# Patient Record
Sex: Female | Born: 1940 | Race: White | Hispanic: No | Smoking: Never smoker
Health system: Southern US, Community
[De-identification: ages and names within clinical notes are randomized; demographics above are authoritative.]

## PROBLEM LIST (undated history)

## (undated) DIAGNOSIS — K5790 Diverticulosis of intestine, part unspecified, without perforation or abscess without bleeding: Secondary | ICD-10-CM

## (undated) DIAGNOSIS — I10 Essential (primary) hypertension: Secondary | ICD-10-CM

## (undated) DIAGNOSIS — M1990 Unspecified osteoarthritis, unspecified site: Secondary | ICD-10-CM

## (undated) DIAGNOSIS — F419 Anxiety disorder, unspecified: Secondary | ICD-10-CM

## (undated) DIAGNOSIS — E785 Hyperlipidemia, unspecified: Secondary | ICD-10-CM

## (undated) DIAGNOSIS — R011 Cardiac murmur, unspecified: Secondary | ICD-10-CM

## (undated) DIAGNOSIS — I341 Nonrheumatic mitral (valve) prolapse: Secondary | ICD-10-CM

## (undated) DIAGNOSIS — C801 Malignant (primary) neoplasm, unspecified: Secondary | ICD-10-CM

## (undated) DIAGNOSIS — K219 Gastro-esophageal reflux disease without esophagitis: Secondary | ICD-10-CM

## (undated) DIAGNOSIS — R112 Nausea with vomiting, unspecified: Secondary | ICD-10-CM

## (undated) DIAGNOSIS — T8859XA Other complications of anesthesia, initial encounter: Secondary | ICD-10-CM

## (undated) DIAGNOSIS — N189 Chronic kidney disease, unspecified: Secondary | ICD-10-CM

## (undated) DIAGNOSIS — T4145XA Adverse effect of unspecified anesthetic, initial encounter: Secondary | ICD-10-CM

## (undated) DIAGNOSIS — Z9889 Other specified postprocedural states: Secondary | ICD-10-CM

## (undated) HISTORY — PX: ABDOMINAL HYSTERECTOMY: SHX81

## (undated) HISTORY — PX: LUMBAR SPINE SURGERY: SHX701

## (undated) HISTORY — DX: Essential (primary) hypertension: I10

## (undated) HISTORY — PX: JOINT REPLACEMENT: SHX530

## (undated) HISTORY — PX: BACK SURGERY: SHX140

## (undated) HISTORY — DX: Hyperlipidemia, unspecified: E78.5

## (undated) HISTORY — DX: Nonrheumatic mitral (valve) prolapse: I34.1

---

## 1997-09-28 ENCOUNTER — Other Ambulatory Visit: Admission: RE | Admit: 1997-09-28 | Discharge: 1997-09-28 | Payer: Self-pay | Admitting: Obstetrics & Gynecology

## 1998-10-05 ENCOUNTER — Other Ambulatory Visit: Admission: RE | Admit: 1998-10-05 | Discharge: 1998-10-05 | Payer: Self-pay | Admitting: Obstetrics & Gynecology

## 1998-11-17 ENCOUNTER — Other Ambulatory Visit: Admission: RE | Admit: 1998-11-17 | Discharge: 1998-11-17 | Payer: Self-pay | Admitting: Obstetrics & Gynecology

## 1998-12-28 ENCOUNTER — Encounter (INDEPENDENT_AMBULATORY_CARE_PROVIDER_SITE_OTHER): Payer: Self-pay | Admitting: Specialist

## 1998-12-28 ENCOUNTER — Other Ambulatory Visit: Admission: RE | Admit: 1998-12-28 | Discharge: 1998-12-28 | Payer: Self-pay | Admitting: Obstetrics & Gynecology

## 1999-04-20 ENCOUNTER — Other Ambulatory Visit: Admission: RE | Admit: 1999-04-20 | Discharge: 1999-04-20 | Payer: Self-pay | Admitting: Obstetrics & Gynecology

## 1999-06-15 ENCOUNTER — Inpatient Hospital Stay (HOSPITAL_COMMUNITY): Admission: RE | Admit: 1999-06-15 | Discharge: 1999-06-20 | Payer: Self-pay | Admitting: Orthopedic Surgery

## 1999-06-15 ENCOUNTER — Encounter: Payer: Self-pay | Admitting: Orthopedic Surgery

## 1999-07-15 ENCOUNTER — Encounter: Admission: RE | Admit: 1999-07-15 | Discharge: 1999-08-02 | Payer: Self-pay | Admitting: Orthopedic Surgery

## 1999-10-10 ENCOUNTER — Other Ambulatory Visit: Admission: RE | Admit: 1999-10-10 | Discharge: 1999-10-10 | Payer: Self-pay | Admitting: Obstetrics & Gynecology

## 2000-01-10 ENCOUNTER — Encounter: Payer: Self-pay | Admitting: Orthopedic Surgery

## 2000-01-10 ENCOUNTER — Ambulatory Visit (HOSPITAL_COMMUNITY): Admission: RE | Admit: 2000-01-10 | Discharge: 2000-01-10 | Payer: Self-pay | Admitting: Orthopedic Surgery

## 2000-07-12 ENCOUNTER — Inpatient Hospital Stay (HOSPITAL_COMMUNITY): Admission: RE | Admit: 2000-07-12 | Discharge: 2000-07-14 | Payer: Self-pay | Admitting: Orthopedic Surgery

## 2000-07-12 ENCOUNTER — Encounter: Payer: Self-pay | Admitting: Orthopedic Surgery

## 2000-07-12 HISTORY — PX: FOOT SURGERY: SHX648

## 2000-10-16 ENCOUNTER — Other Ambulatory Visit: Admission: RE | Admit: 2000-10-16 | Discharge: 2000-10-16 | Payer: Self-pay | Admitting: Obstetrics & Gynecology

## 2001-03-26 ENCOUNTER — Other Ambulatory Visit: Admission: RE | Admit: 2001-03-26 | Discharge: 2001-03-26 | Payer: Self-pay | Admitting: Obstetrics & Gynecology

## 2001-11-04 ENCOUNTER — Other Ambulatory Visit: Admission: RE | Admit: 2001-11-04 | Discharge: 2001-11-04 | Payer: Self-pay | Admitting: Obstetrics & Gynecology

## 2001-12-18 ENCOUNTER — Encounter (INDEPENDENT_AMBULATORY_CARE_PROVIDER_SITE_OTHER): Payer: Self-pay

## 2001-12-18 ENCOUNTER — Observation Stay (HOSPITAL_COMMUNITY): Admission: RE | Admit: 2001-12-18 | Discharge: 2001-12-19 | Payer: Self-pay | Admitting: Obstetrics & Gynecology

## 2001-12-18 HISTORY — PX: TOTAL VAGINAL HYSTERECTOMY: SHX2548

## 2003-08-21 ENCOUNTER — Other Ambulatory Visit: Admission: RE | Admit: 2003-08-21 | Discharge: 2003-08-21 | Payer: Self-pay | Admitting: Obstetrics & Gynecology

## 2004-12-23 ENCOUNTER — Ambulatory Visit: Payer: Self-pay | Admitting: Gastroenterology

## 2005-01-04 ENCOUNTER — Ambulatory Visit: Payer: Self-pay | Admitting: Internal Medicine

## 2006-06-05 ENCOUNTER — Ambulatory Visit (HOSPITAL_COMMUNITY): Admission: RE | Admit: 2006-06-05 | Discharge: 2006-06-05 | Payer: Self-pay | Admitting: Family Medicine

## 2006-11-26 ENCOUNTER — Encounter: Payer: Self-pay | Admitting: Internal Medicine

## 2007-07-17 ENCOUNTER — Other Ambulatory Visit: Admission: RE | Admit: 2007-07-17 | Discharge: 2007-07-17 | Payer: Self-pay | Admitting: Family Medicine

## 2008-08-18 ENCOUNTER — Ambulatory Visit (HOSPITAL_COMMUNITY): Admission: RE | Admit: 2008-08-18 | Discharge: 2008-08-18 | Payer: Self-pay | Admitting: Family Medicine

## 2008-08-31 ENCOUNTER — Ambulatory Visit (HOSPITAL_COMMUNITY): Admission: RE | Admit: 2008-08-31 | Discharge: 2008-08-31 | Payer: Self-pay | Admitting: Family Medicine

## 2009-06-14 HISTORY — PX: KNEE ARTHROSCOPY: SHX127

## 2009-09-01 ENCOUNTER — Ambulatory Visit (HOSPITAL_COMMUNITY): Admission: RE | Admit: 2009-09-01 | Discharge: 2009-09-01 | Payer: Self-pay | Admitting: Family Medicine

## 2010-02-11 ENCOUNTER — Ambulatory Visit: Payer: Self-pay | Admitting: Cardiovascular Disease

## 2010-02-17 ENCOUNTER — Ambulatory Visit: Payer: Self-pay | Admitting: Cardiovascular Disease

## 2010-02-21 ENCOUNTER — Ambulatory Visit
Admission: RE | Admit: 2010-02-21 | Discharge: 2010-02-21 | Payer: Self-pay | Source: Home / Self Care | Attending: Cardiovascular Disease | Admitting: Cardiovascular Disease

## 2010-02-21 HISTORY — PX: CARDIAC CATHETERIZATION: SHX172

## 2010-03-16 ENCOUNTER — Ambulatory Visit: Payer: Self-pay | Admitting: Cardiovascular Disease

## 2010-05-26 ENCOUNTER — Ambulatory Visit: Payer: Self-pay | Admitting: Cardiovascular Disease

## 2010-06-26 ENCOUNTER — Emergency Department (HOSPITAL_COMMUNITY)
Admission: EM | Admit: 2010-06-26 | Discharge: 2010-06-26 | Disposition: A | Discharge status: Home / Self Care | Payer: Medicare Other | Attending: Emergency Medicine | Admitting: Emergency Medicine

## 2010-06-26 ENCOUNTER — Emergency Department (HOSPITAL_COMMUNITY): Payer: Medicare Other

## 2010-06-26 DIAGNOSIS — D72829 Elevated white blood cell count, unspecified: Secondary | ICD-10-CM | POA: Insufficient documentation

## 2010-06-26 DIAGNOSIS — R109 Unspecified abdominal pain: Secondary | ICD-10-CM | POA: Insufficient documentation

## 2010-06-26 DIAGNOSIS — K5732 Diverticulitis of large intestine without perforation or abscess without bleeding: Secondary | ICD-10-CM | POA: Insufficient documentation

## 2010-06-26 DIAGNOSIS — I10 Essential (primary) hypertension: Secondary | ICD-10-CM | POA: Insufficient documentation

## 2010-06-26 LAB — DIFFERENTIAL
Basophils Absolute: 0 10*3/uL (ref 0.0–0.1)
Basophils Relative: 0 % (ref 0–1)
Neutro Abs: 7.9 10*3/uL — ABNORMAL HIGH (ref 1.7–7.7)
Neutrophils Relative %: 73 % (ref 43–77)

## 2010-06-26 LAB — HEPATIC FUNCTION PANEL
AST: 20 U/L (ref 0–37)
Albumin: 3.5 g/dL (ref 3.5–5.2)
Alkaline Phosphatase: 65 U/L (ref 39–117)
Total Bilirubin: 0.7 mg/dL (ref 0.3–1.2)
Total Protein: 6.9 g/dL (ref 6.0–8.3)

## 2010-06-26 LAB — URINE MICROSCOPIC-ADD ON

## 2010-06-26 LAB — CBC
Hemoglobin: 12.8 g/dL (ref 12.0–15.0)
Platelets: 299 10*3/uL (ref 150–400)
RBC: 4.05 MIL/uL (ref 3.87–5.11)

## 2010-06-26 LAB — URINALYSIS, ROUTINE W REFLEX MICROSCOPIC
Glucose, UA: NEGATIVE mg/dL
Ketones, ur: NEGATIVE mg/dL
Nitrite: NEGATIVE
Specific Gravity, Urine: 1.013 (ref 1.005–1.030)
pH: 7 (ref 5.0–8.0)

## 2010-06-26 LAB — BASIC METABOLIC PANEL
CO2: 27 mEq/L (ref 19–32)
Calcium: 10 mg/dL (ref 8.4–10.5)
Chloride: 108 mEq/L (ref 96–112)
GFR calc Af Amer: 55 mL/min — ABNORMAL LOW (ref >60)
Sodium: 141 mEq/L (ref 135–145)

## 2010-06-26 MED ORDER — IOHEXOL 300 MG/ML  SOLN
100.0000 mL | Freq: Once | INTRAMUSCULAR | Status: AC | PRN
  Administered 2010-06-26: 100 mL via INTRAVENOUS

## 2010-07-29 NOTE — Op Note (Signed)
. [facility]  Patient:    Susan Nunez, Susan Nunez                      MRN: [id number] Proc. Date: 07/12/00 Adm. Date:  [id number] Attending:  Aldean Baker V                           Operative Report  PREOPERATIVE DIAGNOSIS:  Osteoarthritis right mid foot.  POSTOPERATIVE DIAGNOSIS:  Osteoarthritis right mid foot.  PROCEDURE:  Right mid foot fusion with open reduction, internal fixation and fusion of the first metatarsal and medial cuneiform, second metatarsal middle cuneiform as well the second moderate metatarsal to the medial cuneiform and fusion of the ______ joint as well as fusion of the third metatarsal to the lateral cuneiform.  SURGEON:  Nadara Mustard, M.D.  ANESTHESIA:  LMA.  ESTIMATED BLOOD LOSS:  Minimal.  ANTIBIOTICS:  1 gram of Kefzol.  TOURNIQUET TIME:  77 minutes at 275 mmHg at the calf.  DISPOSITION:  To PACU in stable condition.  DRESSINGS:  Ivette Loyal compressive dressing.  INDICATIONS FOR PROCEDURE:  The patient is a 70 year old woman with osteoarthritis of the right mid foot.  She has failed conservative care and presents at this time for surgical intervention.  The risks and benefits were discussed.  The patient states she understands and wishes to proceed at this time.  DESCRIPTION OF PROCEDURE:  The patient was brought to operating room #5 and underwent a general LMA anesthetic.  After adequate levels of anesthesia had been obtained the patients right lower extremity was prepped using duraprep and draped in a sterile field and Ioban was used to cover all exposed skin over the stockinette.  The leg was elevated and the tourniquet at the calf was inflated to 275 mmHg.  Two incisions were made, one was made over the first web space and this was slightly medial to the first web space and the other longitudinal incision was made over the fourth metatarsal and this was just lateral to the fourth metatarsal.  The mid foot  joints were identified and were taken down to the significant osteoarthritic changes of both the ______ joint.  There were cystic changes.  Significant osteoarthritis of the second metatarsal and middle cuneiform as well as the first metatarsal and medial cuneiform and the third metatarsal lateral cuneiform.  These joints were taken down.  The articular cartilage was removed using an oscillating saw and this was taken back to to bleeding cancellous bone.  The wound was irrigated with normal saline.  A towel clip was then used to reduce the ______ joint with the second metatarsal being stabilized to the middle cuneiform.  This was then stabilized with a screw going transversely.  A lag screw technique was used. The first metatarsal cuneiform was then stabilized with a lag screw technique as well.  Attention was then focused to third metatarsal and the third metatarsal was stabilized to the lateral and middle cuneiforms with a lag screw.  The second metatarsal was then stabilized to the middle cuneiform with a lag screw technique.  The radiographs were checked.  The alignment was good.  The screw length was appropriate.  This was all checked using C-arm fluoroscopy.  The wounds were again irrigated with normal saline.  These incisions were closed using 2-0 nylon with a vertical mattress.  There was no tension on the skin.  The wounds closed without tension.  The wounds were covered with Adaptic, orthopedic sponges, sterile Webril and a compressive Ivette Loyal dressing was applied.  There was no plaster used in the dressing.  The tourniquet was deflated after 77 minutes.  There was good capillary refill in the toes.  The patient was extubated and taken to PACU in stable condition with plans for admission for pain control. DD:  07/12/00 TD:  07/13/00 Job: [id number]

## 2010-07-29 NOTE — H&P (Signed)
[place]  Patient:    Susan Nunez, Susan Nunez                      MRN: [id number] Proc. Date: 06/15/99 Adm. Date:  [id number] Attending:  Nadara Mustard                         History and Physical  HISTORY OF PRESENT ILLNESS:  The patient is a 70 year old woman with significant osteoarthritis of her left knee.  She is status post meniscectomy, nonsteroidal  treatments and steroid injections without relief.  She states she is unable to perform her activities of daily living secondary to pain and presents at this time for left total knee arthroplasty.  ALLERGIES:  DEMEROL causes hives.  PENICILLIN causes rash and itching.  MEDICATIONS: 1. Celebrex 200 mg q.d. 2. Premarin 0.625 mg patch. 3. Multivitamins.  PAST MEDICAL HISTORY:  Significant for a mild mitral valve prolapse, osteoarthritis, lumbar spine fusion L5-S1 performed 20 years ago x 3, and left nee arthroscopy.  SOCIAL HISTORY:  She is a Runner, broadcasting/film/video.  REVIEW OF SYSTEMS:  Negative for substernal chest pain, negative for shortness f breath.  PHYSICAL EXAMINATION:  VITAL SIGNS:  Temperature 97.4, pulse 72, respiratory rate 16, blood pressure 118/70.  GENERAL:  She is in no acute distress.  LUNGS:  Clear to auscultation.  CARDIOVASCULAR:  Regular rate and rhythm with a history of mitral valve prolapse.  EXTREMITIES:  Knee range of motion 20 to 100.  There is no palpable dorsalis pedis pulse, but there is good capillary refill.  Plantar flexion and dorsiflexion strength is good.  She is numb over the dorsum of the left foot at the anterior  tibia which she states is secondary to her previous spine surgery.  Her primary care physician is Dr. Dimple Casey, cardiologist is Dr. Elease Hashimoto. Echocardiogram and cardiac consult obtained prior to surgery.  Echo showed good cardiac function.  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  PLAN:  Will schedule her for left  total knee arthroplasty.  Risks and benefits ere discussed including infection, neurovascular injury, DVT, pulmonary embolus, death, need for transfusion, pain, decreased range of motion, stiffness, failure of implant.  The patient states he understands and wishes to proceed at this time. DD:  06/15/99 TD:  06/15/99 Job: [id number]

## 2010-07-29 NOTE — H&P (Signed)
[place]. [facility]  Patient:    Susan Nunez, Susan Nunez                      MRN: [id number] Adm. Date:  [id number] Attending:  Nadara Mustard                         History and Physical  HISTORY OF PRESENT ILLNESS:  Patient is a 71 year old woman with severe osteoarthritis of her right midfoot.  Patient has undergone several years of conservative care with protective shoewear, insert modification, activity modification, nonsteroidals without relief and presents at this time for a fusion of her right midfoot joint.  ALLERGIES:  PENICILLIN causes a rash.  DEMEROL causes hives.  MEDICATIONS:  Premphase, Relafen and doxycycline.  SURGICAL HISTORY:  Positive for laminectomy, L5-S1, in 1978; lumbar spine fusion in 1982; left knee arthroscopy in 1992; left total knee arthroplasty in April of 2001.  FAMILY HISTORY:  Positive for heart disease.  SOCIAL HISTORY:  Negative for tobacco.  Negative for alcohol.  She is married and a Runner, broadcasting/film/video.  REVIEW OF SYSTEMS:  Positive for arthritis and mitral valve prolapse.  PHYSICAL EXAMINATION:  VITAL SIGNS:  Temperature 97.8, pulse 88, respiratory rate 16, blood pressure 122/78.  GENERAL:  She is in no acute distress.  LUNGS:  Clear to auscultation.  CARDIOVASCULAR:  Regular rate and rhythm.  EXTREMITIES:  She has a good dorsalis pedis pulse.  She has pain to palpation over the midfoot; she also has pain with distraction across the Lisfranc joint.  IMAGING FINDINGS:  CT scan shows significant osteoarthritis of the midfoot joint.  ASSESSMENT:  Osteoarthritis, right midfoot joint.  PLAN:  Patient is scheduled at this time for fusion of the first metatarsal medial cuneiform, second metatarsal middle cuneiform, fusion of the Lisfranc joint and fusion of the third metatarsal to lateral cuneiform.  Risks and benefits were discussed including infection, neurovascular injury, persistent pain, need for additional surgery,  nonhealing of the wounds and dystrophic pain.  Patient states she understands and wishes to proceed at this time. DD:  07/12/00 TD:  07/13/00 Job: [id number]

## 2010-07-29 NOTE — Discharge Summary (Signed)
[place]. [facility]  Patient:    Susan Nunez, Susan Nunez                      MRN: [id number] Adm. Date:  [id number] Disc. Date: [id number] Attending:  Aldean Baker V                           Discharge Summary  DIAGNOSIS:  Osteoarthritis, left knee.  PROCEDURE:  Left total knee arthroplasty.  DISPOSITION:  Discharged to home in stable condition.  DISCHARGE MEDICATIONS:  Home health PT, CPM, commode, walker, Coumadin for DVT prophylaxis with pharmacy managing, and home health nursing drawing blood work.  FOLLOW-UP:  Follow up in the office in one week.  HISTORY OF PRESENT ILLNESS:  The patient is a 70 year old woman with osteoarthritis of the left knee, status post meniscectomy.  Treated conservatively with nonsteroidals and steroid injections without relief. Unable to perform activities of daily living.  Presents at this time for total knee replacement.  HOSPITAL COURSE:  06/15/99.  Diagnosis:  Osteoarthritis, left knee.  Procedure: Left total knee arthroplasty with [employer] components, #9 femur, #7 tibia, #7 patella with a 12 mm poly tray.  Surgeon:  Nadara Mustard, M.D. Assistant:  Reynolds Bowl, M.D.  The patients hospital course was essentially unremarkable.  She was started on physical therapy on postoperative day #1 with weightbearing as tolerated, Kefzol for infection prophylaxis, and Coumadin for DVT prophylaxis.  The hemoglobin was stable.  The patient felt that she was progressing well.  Rehabilitation did not think that she needed a rehabilitation stay.  She was discharged to home in stable condition on 06/20/99, for follow-up in the office in one week.  Continue Coumadin for three weeks for DVT prophylaxis.  Continue with therapy with home health PT. DD:  07/12/99 TD:  07/13/99 Job: [id number]

## 2010-07-29 NOTE — H&P (Signed)
NAME:  Susan Nunez, Susan Nunez                         ACCOUNT NO.:  [id number]   MEDICAL RECORD NO.:  [id number]                   PATIENT TYPE:  INP   LOCATION:  NA                                   FACILITY:  [facility]   PHYSICIAN:  Ilda Mori, M.D.                DATE OF BIRTH:  November 17, 1940   DATE OF ADMISSION:  12/18/2001  DATE OF DISCHARGE:                                HISTORY & PHYSICAL   CHIEF COMPLAINT:  Pelvic mass.   HISTORY OF PRESENT ILLNESS:  The patient is a 70 year old, gravida 2, para 2  female who is approximately 10 years postmenopausal who presents for  evaluation of a pelvic mass.  The patient has been followed in the office  for a mild abnormal Pap smear and for yearly routine gynecological exams.  The patient had intermittent atypical and mild dysplastic lesions on her  cervix and these have been evaluated and treated by multiple colposcopies,  cervical cryotherapy and then when the lesion persisted a LEEP cone  procedure was performed.  The patient's most recent Pap smear was done in  August 2003 and was normal.  On that exam the patient was found to have a 4  cm pedunculated mass anterior to the uterus on the left side.  It was  difficult to tell on exam whether this was attached to the uterus and an  ultrasound was performed.  The ultrasound showed a mass consistent with a  myoma however, the stalk could not be defined and therefore, a solid ovarian  mass could not be ruled out.  A CA 125 was done and this came back 44.6  which was consistent with a myoma but also not reassuring that this could  not be an ovarian cancer.  This finding was discussed with the patient who  felt very strongly that she wanted to proceed with surgical excision of the  mass to rule out cancer.  The patient also strongly requested that a  hysterectomy be performed because despite the most recently normal Pap smear  the patient was very concerned about her long history of intermittently  abnormal Pap smears and wanted her uterus and cervix removed as well.   PAST MEDICAL/SURGICAL HISTORY:  The patient is allergic to PENICILLIN and  DEMEROL.  She has osteoarthritis and mitral valve prolapse.  Surgically, she  has had multiple spinal fusions and orthopedic procedures including most  recently a knee replacement and surgery on her left foot.   FAMILY HISTORY:  Negative for breast, ovarian, or colon cancer.  Her father  did have coronary artery disease.  A maternal aunt had diabetes mellitus.   SOCIAL HISTORY:  Negative for tobacco use or for recreational drugs.   REVIEW OF SYSTEMS:  Negative in detail.   PHYSICAL EXAMINATION:  VITAL SIGNS: She weighed 199 pounds and was 6 feet  tall.  Blood pressure 120/60.  EAR/NOSE/THROAT: Normal.  NECK:  Supple without thyromegaly.  BREASTS: Without masses.  HEART: Regular sinus rhythm without murmur or gallop.  LUNGS: Clear to auscultation and percussion.  ABDOMEN: Soft without hepatosplenomegaly.  LYMPHATICS: Lymph nodes were negative.  GENITALIA: External genitalia appeared normal as did her vagina and cervix.  The uterus was midposition, mobile, with an anterior 4 cm mass that could be  from the uterus or from the adnexa.   DIAGNOSTIC STUDIES:  An ultrasound study as noted above showed a 4 cm solid  mass which could not be defined as being ovarian or a pedunculated uterine  myoma.   IMPRESSION:  This is a probable benign ovarian mass or myoma however, she is  postmenopausal and does have an elevated CA 125.  The case was discussed  with Dr. De Blanch and he felt that it was not necessary to be  present at the surgery since the likelihood that this was benign was so  great, however, he will stand by if malignancy is found at frozen section  and he will come in and help with the lymph node dissection if that is  indicated.   PLAN:  The plan is to proceed with a laparoscopy and to do a  laparoscopically-assisted  vaginal hysterectomy unless on laparoscopic  evaluation there appears to be a possibility of malignancy.  If that is the  case then a total abdominal hysterectomy, bilateral salpingo-oophorectomy  and lymph node dissection will be done through an abdominal approach.                                               Ilda Mori, M.D.    RK/MEDQ  D:  12/17/2001  T:  12/17/2001  Job:  [id number]

## 2010-07-29 NOTE — Discharge Summary (Signed)
NAME:  Susan Nunez, Susan Nunez                         ACCOUNT NO.:  [id number]   MEDICAL RECORD NO.:  [id number]                   PATIENT TYPE:  OBV   LOCATION:  9309                                 FACILITY:  [facility]   PHYSICIAN:  Ilda Mori, M.D.                DATE OF BIRTH:  12/17/1940   DATE OF ADMISSION:  12/18/2001  DATE OF DISCHARGE:  12/19/2001                                 DISCHARGE SUMMARY   FINAL DIAGNOSES:  1. Pelvic mass.  2. 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  3. History of cervical dysplasia.   SECONDARY DIAGNOSES:  1. Osteoarthritis.  2. Mitral valve prolapse.   PROCEDURES:  1. Laparoscopically assisted vaginal hysterectomy.  2. Bilateral salpingo-oophorectomy.   COMPLICATIONS:  None.   CONDITION ON DISCHARGE:  Improved.   HISTORY OF PRESENT ILLNESS:  This is a 70 year old, gravida 2, para 2,  approximately 10 years postmenopausal, who presents for evaluation of a  pelvic mass.  The patient is followed in the office for mild abnormal Pap  smears and for yearly routine gynecological examinations.  The patient has  had intermittent atypical and mild dysplastic lesions on her cervix.  She  has been evaluated and treated multiple times with colposcopy, cervical  cryotherapy, and LEEP conization.  The patient's most recent Pap smear was  normal.  However, on that exam she was found to have a 4 cm pedunculated  mass.  It was unclear whether this was a pedunculated myoma or a solid  ovarian tumor.  The CA125 was slightly elevated at 44.6.  The patient was  quite concerned with the possibility of cancer and strongly requested that  surgical evaluation be carried out.   HOSPITAL COURSE:  The patient was taken to the operating room on the day of  admission where a laparoscopy was performed which revealed a pedunculated  uterine myoma.  A laparoscopically assisted vaginal hysterectomy and  bilateral salpingo-oophorectomy were then carried out without complications.  The patient's  postoperative observation was totally benign.  On the morning  of the first postoperative day, the patient was felt to be ready for  discharge.  She was afebrile with a hemoglobin of 11.2.  She was eating  without problems and her pain was controlled with oral analgesia.   DIET:  She was discharged on a regular diet.   ACTIVITY:  Told to limit her activities.   DISCHARGE MEDICATIONS:  She was given 25 Tylox tablets to take one to two  every four hours for pain.   FOLLOW-UP:  Asked to return to the office in two to three weeks for  evaluation.   LABORATORY DATA:  An EKG was read as normal sinus rhythm with sinus  arrhythmia and no significant change since the last tracing.  Her hemoglobin  on admission was 13.1 and on discharge was 11.2.  The white count on  discharge was 11.5.  Her routine chemistry evaluations were all  normal.  Her  urinalysis was benign.  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                                               Ilda Mori, M.D.    RK/MEDQ  D:  01/27/2002  T:  01/27/2002  Job:  [id number]

## 2010-07-29 NOTE — Op Note (Signed)
NAME:  Susan Nunez, Susan Nunez                         ACCOUNT NO.:  [id number]   MEDICAL RECORD NO.:  [id number]                   PATIENT TYPE:  INP   LOCATION:  9399                                 FACILITY:  [facility]   PHYSICIAN:  Ilda Mori, M.D.                DATE OF BIRTH:  [birth year]   DATE OF PROCEDURE:  12/18/2001  DATE OF DISCHARGE:                                 OPERATIVE REPORT   PREOPERATIVE DIAGNOSES:  Pelvic mass.   POSTOPERATIVE DIAGNOSES:  Pedunculated uterine myoma.   PROCEDURE:  Laparoscopically assisted vaginal hysterectomy with bilateral  salpingo-oophorectomy.   SURGEON:  Ilda Mori, M.D.   ASSISTANT:  Luvenia Redden, MD   ANESTHESIA:  General endotracheal.   ESTIMATED BLOOD LOSS:  200 cc.   FINDINGS:  A 4 cm pedunculated myoma.  Normal appearing tubes, ovaries, and  the pelvis was totally normal.   INDICATIONS:  This is a 70 year old gravida 2, para 2 who was noted to have  a pelvic mass at her last routine physical examination.  On ultrasound it  was impossible to say whether this mass represented a pedunculated uterine  myoma or a solid ovarian tumor.  A CA-125 was 44.4 which was consistent with  both these diagnoses.  The options were discussed with the patient who felt  very strongly that she wanted to proceed with operative evaluation of this  mass.  In addition, the patient felt very strongly that she wanted to have a  hysterectomy in addition to removal of the mass due to the fact that she has  had multiple mildly abnormal Pap smears throughout the last 10 years and was  very anxious to eliminate any risk of cervical cancer.   PROCEDURE:  The patient was taken to the operating room, placed in the  dorsal lithotomy position, and general endotracheal anesthesia was induced.  The abdomen, perineum, and vagina were then prepped and draped in a sterile  fashion.  A Veress needle was introduced through a small incision at the  base of the umbilicus  and pneumoperitoneum was created.  A 5 mm probe was  then placed through the umbilicus and the 5 mm laparoscope was introduced  and the pelvis was viewed with the findings noted above.  The accessory  instruments were placed 3 cm laterally to the midline in the left and right  lower quadrants.  The infundibulopelvic ligaments were identified.  The  ureters were identified well inferior and were peristalsing nicely.  The  infundibulopelvic ligaments were then cauterized and incised and this  incision was carried down to the round ligaments.  This was done  bilaterally.  At this point the operation continued at the vaginal area.  The cervix was grasped with a Jacob's tenaculum.  The paracervical tissue  was infiltrated with a dilute epinephrine and lidocaine solution.  The  cervix was circumcised.  The anterior and posterior cul-de-sacs were  developed.  The posterior cul-de-sac was entered first.  The uterosacral  ligaments were clamped, cut, and ligated.  The cardinal ligament and uterine  arteries were clamped with LigaSure clamp, cauterized, and cut.  The  anterior cul-de-sac was entered and the remainder of the lower broad  ligament were clamped with the LigaSure and cauterized and cut.  At this  point the uterus, the pedunculated myoma, and the tubes and ovaries were  delivered posteriorly.  The remainder of the broad ligament was controlled  with the LigaSure, clamped, cauterized, and cut.  The specimen was then  freed.  The posterior vaginal cuff was closed with a running interlocking  Monocryl suture.  The peritoneum was closed with a purse-string suture.  The  anterior vaginal cuff was closed with interrupted and figure-of-eight  suture.  The bladder was catheterized and clear urine was obtained.  The  patient was then repositioned so that follow-up laparoscopy could be  performed under low pressure.  The pedicles were examined and no bleeding  was noted.  The procedure was then  terminated.  The abdominal laparoscopic  tissues were closed with Dermabond material.  The procedure was then  terminated and the patient left the operating room in good condition.                                                Ilda Mori, M.D.    RK/MEDQ  D:  12/18/2001  T:  12/18/2001  Job:  [id number]

## 2010-08-24 ENCOUNTER — Other Ambulatory Visit (HOSPITAL_COMMUNITY): Payer: Self-pay | Admitting: Family Medicine

## 2010-08-24 DIAGNOSIS — Z1231 Encounter for screening mammogram for malignant neoplasm of breast: Secondary | ICD-10-CM

## 2010-09-05 ENCOUNTER — Ambulatory Visit (HOSPITAL_COMMUNITY)
Admission: RE | Admit: 2010-09-05 | Discharge: 2010-09-05 | Disposition: A | Discharge status: Home / Self Care | Payer: Medicare Other | Source: Ambulatory Visit | Attending: Family Medicine | Admitting: Family Medicine

## 2010-09-05 DIAGNOSIS — Z1231 Encounter for screening mammogram for malignant neoplasm of breast: Secondary | ICD-10-CM | POA: Insufficient documentation

## 2010-10-19 ENCOUNTER — Encounter: Payer: Self-pay | Admitting: *Deleted

## 2010-10-28 ENCOUNTER — Ambulatory Visit (INDEPENDENT_AMBULATORY_CARE_PROVIDER_SITE_OTHER): Payer: Medicare Other | Admitting: Cardiovascular Disease

## 2010-10-28 ENCOUNTER — Encounter: Payer: Self-pay | Admitting: Cardiovascular Disease

## 2010-10-28 DIAGNOSIS — E785 Hyperlipidemia, unspecified: Secondary | ICD-10-CM | POA: Insufficient documentation

## 2010-10-28 DIAGNOSIS — R079 Chest pain, unspecified: Secondary | ICD-10-CM

## 2010-10-28 DIAGNOSIS — I059 Rheumatic mitral valve disease, unspecified: Secondary | ICD-10-CM

## 2010-10-28 DIAGNOSIS — I341 Nonrheumatic mitral (valve) prolapse: Secondary | ICD-10-CM

## 2010-10-28 NOTE — Assessment & Plan Note (Signed)
She's not having any current episodes of chest pain. She's had a normal heart catheterization several months ago.

## 2010-10-28 NOTE — Progress Notes (Signed)
Susan Nunez Date of Birth  [birth year] [facility] / [facility] [address]     [address] Susan Nune[place], [place]  [address] [phone]  Fax  [phone]  History of Present Illness:  Susan Nunez is a 70 year old female with a history of mitral prolapse. She also has a history of hypertension and hyperlipidemia. She had normal coronary arteries by heart catheterization in December 2011.  She's been bothered by diverticulitis recently. She has not been eating regularly because of her upset stomach.   Current Outpatient Prescriptions on File Prior to Visit  Medication Sig Dispense Refill  . alendronate (FOSAMAX) 70 MG tablet Take 70 mg by mouth every 7 (seven) days. Take with a full glass of water on an empty stomach.       Marland Kitchen aspirin 81 MG tablet Take 81 mg by mouth daily.        . Cholecalciferol (VITAMIN D3) 3000 UNITS TABS Take 1 tablet by mouth daily.        . Cyanocobalamin (VITAMIN B-12 PO) Take 1 tablet by mouth daily.        Marland Kitchen estradiol (ESTRACE) 0.5 MG tablet Take 0.5 mg by mouth daily.        Marland Kitchen lisinopril (PRINIVIL,ZESTRIL) 5 MG tablet Take 5 mg by mouth daily.        Marland Kitchen LORazepam (ATIVAN) 0.5 MG tablet Take 0.5 mg by mouth as needed.        . multivitamin (THERAGRAN) per tablet Take 1 tablet by mouth daily.        . Omega-3 Fatty Acids (FISH OIL) 1200 MG CAPS Take 1,200 mg by mouth daily.        Marland Kitchen omeprazole (PRILOSEC OTC) 20 MG tablet Take 20 mg by mouth as needed.        Marland Kitchen PROPRANOLOL HCL PO Take 1 tablet by mouth as needed.        . calcium carbonate (OS-CAL) 600 MG TABS Take 600 mg by mouth daily.        Marland Kitchen HYDROcodone-acetaminophen (VICODIN) 5-500 MG per tablet Take 1 tablet by mouth as needed.        . nitroGLYCERIN (NITROSTAT) 0.4 MG SL tablet Place 0.4 mg under the tongue every 5 (five) minutes as needed.        . Pyridoxine HCl (VITAMIN B-6 PO) Take 1 tablet by mouth daily.          Allergies  Allergen Reactions  . Demerol   .  Penicillins     Past Medical History  Diagnosis Date  . Hyperlipidemia   . Hypertension   . Mitral valve prolapse     with mitral regurgitation     Past Surgical History  Procedure Date  . Cardiac catheterization 02/21/2010    Est. EF at 65% -- mooth and normal coronary arteries -- Normal left ventricular systolic function.  We will continue with medical therapy.  I suspect her chest pain was due to her mitral valve prolapse syndrome --  Vesta Mixer, M.D.   . Total vaginal hysterectomy 12/18/2001  . Foot surgery 07/12/00    right  . Knee arthroscopy 06/14/2009  . Lumbar spine surgery     lumbar spine fusion L5-S1 performed 20 years ago x 3    History  Smoking status  . Never Smoker   Smokeless tobacco  . Never Used    History  Alcohol Use No    Family History  Problem Relation Age of Onset  .  Coronary artery disease Father   . Heart attack Father   . Hypertension Mother     Reviw of Systems:  Reviewed in the HPI.  All other systems are negative.  Physical Exam: BP 108/58  Pulse 74  Ht 5\' 9"  (1.753 m)  Wt 173 lb (78.472 kg)  BMI 25.55 kg/m2 The patient is alert and oriented x 3.  The mood and affect are normal.   Skin: warm and dry.  Color is normal.    HEENT:   the sclera are nonicteric.  The mucous membranes are moist.  The carotids are 2+ without bruits.  There is no thyromegaly.  There is no JVD.    Lungs: clear.  The chest wall is non tender.    Heart: regular rate with a normal S1 and S2.  There is a soft mid systolic click. The PMI is not displaced.     Abdomen: good bowel sounds.  There is no guarding or rebound.  There is no hepatosplenomegaly or tenderness.  There are no masses.   Extremities:  no clubbing, cyanosis, or edema.  The legs are without rashes.  The distal pulses are intact.   Neuro:  Cranial nerves II - XII are intact.  Motor and sensory functions are intact.    The gait is normal.  ECG: NSR.  No ST or T wave  changes.  Assessment / Plan:

## 2010-10-28 NOTE — Assessment & Plan Note (Signed)
Her mitral valve prolapse appears to be fairly well controlled. Her exam is basically unchanged.

## 2010-10-28 NOTE — Assessment & Plan Note (Signed)
She reports that her cholesterol lev elevated on previous exams. We'll check her levels  in 6 months.

## 2010-11-01 ENCOUNTER — Encounter: Payer: Self-pay | Admitting: Cardiovascular Disease

## 2011-04-18 ENCOUNTER — Telehealth: Payer: Self-pay | Admitting: Cardiovascular Disease

## 2011-04-18 MED ORDER — PROPRANOLOL HCL 10 MG PO TABS: 10.0000 mg | ORAL_TABLET | Freq: Two times a day (BID) | ORAL | Status: DC | PRN

## 2011-04-18 NOTE — Telephone Encounter (Signed)
Fax Received. Refill Completed. Lamin Chandley Chowoe (R.M.A)   

## 2011-04-18 NOTE — Telephone Encounter (Addendum)
New Refill   Patient requesting refill  PROPRANOLOL HCL PO   [facility] [place]   [address], [place], [place] [address] [phone]

## 2011-05-03 ENCOUNTER — Encounter: Payer: Self-pay | Admitting: Cardiovascular Disease

## 2011-05-03 ENCOUNTER — Ambulatory Visit (INDEPENDENT_AMBULATORY_CARE_PROVIDER_SITE_OTHER): Payer: Medicare Other | Admitting: Cardiovascular Disease

## 2011-05-03 DIAGNOSIS — R079 Chest pain, unspecified: Secondary | ICD-10-CM

## 2011-05-03 DIAGNOSIS — I059 Rheumatic mitral valve disease, unspecified: Secondary | ICD-10-CM

## 2011-05-03 DIAGNOSIS — I341 Nonrheumatic mitral (valve) prolapse: Secondary | ICD-10-CM

## 2011-05-03 NOTE — Patient Instructions (Signed)
Your physician wants you to follow-up in: 6 months with Dr. Elease Hashimoto.  You will receive a reminder letter in the mail two months in advance. If you don't receive a letter, please call our office to schedule the follow-up  appointment.  Your physician recommends that you return for fasting lab work and an EKG at your 6 month follow up.

## 2011-05-03 NOTE — Assessment & Plan Note (Signed)
She has mild MVP and mild aortic insufficiency by echo several years ago.

## 2011-05-03 NOTE — Progress Notes (Signed)
Susan Nunez Date of Birth  Feb 07, 1941 [facility]     [place] [facility]  [address]    [address]   [address] [place], [place]  [address]    [place], [place]  [address] [phone]  Fax  [phone]  [phone]  Fax [phone]  Problem list: 1. Mitral valve prolapse with mitral regurgitation -has frequent chest pains 2. Hypertension 3. Hyperlipidemia 4. Normal coronary arteries by heart catheterization in December, 2011 5. Mild Aortic insufficiency.  History of Present Illness:  She has had some episodes of chest pains.  These pains last for hours and are likely due to MVP.  These also could be due to GERD.  She also has severe radiation to her left arm. It was for these symptoms that we performed a cardiac catheterization in 2011 which revealed smooth and normal coronary arteries.  Current Outpatient Prescriptions on File Prior to Visit  Medication Sig Dispense Refill  . alendronate (FOSAMAX) 70 MG tablet Take 70 mg by mouth every 7 (seven) days. Take with a full glass of water on an empty stomach.       Marland Kitchen aspirin 81 MG tablet Take 81 mg by mouth daily.        . Cholecalciferol (VITAMIN D3) 3000 UNITS TABS Take 1 tablet by mouth daily.        . Cyanocobalamin (VITAMIN B-12 PO) Take 1 tablet by mouth daily.        Marland Kitchen estradiol (ESTRACE) 0.5 MG tablet Take 0.5 mg by mouth daily.        Marland Kitchen HYDROcodone-acetaminophen (VICODIN) 5-500 MG per tablet Take 1 tablet by mouth as needed.        Marland Kitchen lisinopril (PRINIVIL,ZESTRIL) 5 MG tablet Take 5 mg by mouth daily.        Marland Kitchen LORazepam (ATIVAN) 0.5 MG tablet Take 0.5 mg by mouth as needed.        . multivitamin (THERAGRAN) per tablet Take 1 tablet by mouth daily.        . Omega-3 Fatty Acids (FISH OIL) 1200 MG CAPS Take 1,200 mg by mouth daily.        Marland Kitchen omeprazole (PRILOSEC OTC) 20 MG tablet Take 20 mg by mouth as needed.        . propranolol (INDERAL) 10 MG tablet Take 1 tablet (10 mg total) by mouth 2 (two) times daily  as needed.  60 tablet  2    Allergies  Allergen Reactions  . Demerol   . Penicillins   . Zoloft     Diarrhea     Past Medical History  Diagnosis Date  . Hyperlipidemia   . Hypertension   . Mitral valve prolapse     with mitral regurgitation     Past Surgical History  Procedure Date  . Cardiac catheterization 02/21/2010    Est. EF at 65% -- mooth and normal coronary arteries -- Normal left ventricular systolic function.  We will continue with medical therapy.  I suspect her chest pain was due to her mitral valve prolapse syndrome --  Vesta Mixer, M.D.   . Total vaginal hysterectomy 12/18/2001  . Foot surgery 07/12/00    right  . Knee arthroscopy 06/14/2009  . Lumbar spine surgery     lumbar spine fusion L5-S1 performed 20 years ago x 3    History  Smoking status  . Never Smoker   Smokeless tobacco  . Never Used    History  Alcohol Use  No    Family History  Problem Relation Age of Onset  . Coronary artery disease Father   . Heart attack Father   . Hypertension Mother     Reviw of Systems:  Reviewed in the HPI.  All other systems are negative.  Physical Exam: Blood pressure 116/64, pulse 62, height 5\' 9"  (1.753 m), weight 171 lb (77.565 kg). General: Well developed, well nourished, in no acute distress.  Head: Normocephalic, atraumatic, sclera non-icteric, mucus membranes are moist,   Neck: Supple. Negative for carotid bruits. JVD not elevated.  Lungs: Clear bilaterally to auscultation without wheezes, rales, or rhonchi. Breathing is normal.  Heart: RRR with S1 S2. There is a very soft systolic murmur at the LSB.  Abdomen: Soft, non-tender, non-distended with normal bowel sounds. No hepatomegaly. No rebound/guarding. No obvious abdominal masses.  Msk:  Strength and tone appear normal for age.  Extremities: No clubbing or cyanosis. No edema.  Distal pedal pulses are 2+ and equal bilaterally.  Neuro: Alert and oriented X 3. Moves all extremities  spontaneously.  Psych:  Responds to questions appropriately with a normal affect.  ECG:  Assessment / Plan:

## 2011-05-03 NOTE — Assessment & Plan Note (Signed)
Susan Nunez continues to complain of episodes of chest pain. I'm not sure whether these are due to her mitral valve prolapse or perhaps due to gastroesophageal reflux disease. I suggested that she try taking omeprazole in the day for the next 2 weeks to see if this helps. We have performed a cardiac catheterization and she has been found to have smooth and normal coronary arteries.  She already has propranolol to take for her mitral valve prolapse.

## 2011-07-31 ENCOUNTER — Encounter: Payer: Self-pay | Admitting: Cardiovascular Disease

## 2011-10-16 ENCOUNTER — Other Ambulatory Visit (HOSPITAL_COMMUNITY): Payer: Self-pay | Admitting: Family Medicine

## 2011-10-16 DIAGNOSIS — Z139 Encounter for screening, unspecified: Secondary | ICD-10-CM

## 2011-10-19 ENCOUNTER — Ambulatory Visit (HOSPITAL_COMMUNITY)
Admission: RE | Admit: 2011-10-19 | Discharge: 2011-10-19 | Disposition: A | Discharge status: Home / Self Care | Payer: Medicare Other | Source: Ambulatory Visit | Attending: Family Medicine | Admitting: Family Medicine

## 2011-10-19 DIAGNOSIS — Z139 Encounter for screening, unspecified: Secondary | ICD-10-CM

## 2011-10-19 DIAGNOSIS — Z1231 Encounter for screening mammogram for malignant neoplasm of breast: Secondary | ICD-10-CM | POA: Insufficient documentation

## 2012-10-14 ENCOUNTER — Other Ambulatory Visit: Payer: Self-pay | Admitting: Cardiovascular Disease

## 2012-10-14 ENCOUNTER — Encounter: Payer: Self-pay | Admitting: Cardiovascular Disease

## 2012-10-23 ENCOUNTER — Other Ambulatory Visit (HOSPITAL_COMMUNITY): Payer: Self-pay | Admitting: Family Medicine

## 2012-10-23 DIAGNOSIS — M810 Age-related osteoporosis without current pathological fracture: Secondary | ICD-10-CM

## 2012-10-23 DIAGNOSIS — Z139 Encounter for screening, unspecified: Secondary | ICD-10-CM

## 2012-10-24 ENCOUNTER — Ambulatory Visit (HOSPITAL_COMMUNITY)
Admission: RE | Admit: 2012-10-24 | Discharge: 2012-10-24 | Disposition: A | Discharge status: Home / Self Care | Payer: Medicare PPO | Source: Ambulatory Visit | Attending: Family Medicine | Admitting: Family Medicine

## 2012-10-24 DIAGNOSIS — M899 Disorder of bone, unspecified: Secondary | ICD-10-CM | POA: Insufficient documentation

## 2012-10-24 DIAGNOSIS — M810 Age-related osteoporosis without current pathological fracture: Secondary | ICD-10-CM

## 2012-10-25 ENCOUNTER — Other Ambulatory Visit (HOSPITAL_COMMUNITY): Payer: Medicare Other

## 2012-10-31 ENCOUNTER — Ambulatory Visit (HOSPITAL_COMMUNITY)
Admission: RE | Admit: 2012-10-31 | Discharge: 2012-10-31 | Disposition: A | Discharge status: Home / Self Care | Payer: Medicare PPO | Source: Ambulatory Visit | Attending: Family Medicine | Admitting: Family Medicine

## 2012-10-31 DIAGNOSIS — Z1231 Encounter for screening mammogram for malignant neoplasm of breast: Secondary | ICD-10-CM | POA: Insufficient documentation

## 2012-10-31 DIAGNOSIS — Z139 Encounter for screening, unspecified: Secondary | ICD-10-CM

## 2013-10-13 ENCOUNTER — Other Ambulatory Visit (HOSPITAL_COMMUNITY): Payer: Self-pay | Admitting: Family Medicine

## 2013-10-13 DIAGNOSIS — Z139 Encounter for screening, unspecified: Secondary | ICD-10-CM

## 2013-11-05 ENCOUNTER — Other Ambulatory Visit (HOSPITAL_COMMUNITY): Payer: Self-pay | Admitting: Family Medicine

## 2013-11-05 ENCOUNTER — Ambulatory Visit (HOSPITAL_COMMUNITY)
Admission: RE | Admit: 2013-11-05 | Discharge: 2013-11-05 | Disposition: A | Discharge status: Home / Self Care | Payer: Medicare PPO | Source: Ambulatory Visit | Attending: Family Medicine | Admitting: Family Medicine

## 2013-11-05 DIAGNOSIS — Z139 Encounter for screening, unspecified: Secondary | ICD-10-CM

## 2013-11-05 DIAGNOSIS — Z1239 Encounter for other screening for malignant neoplasm of breast: Secondary | ICD-10-CM

## 2013-11-05 DIAGNOSIS — Z1231 Encounter for screening mammogram for malignant neoplasm of breast: Secondary | ICD-10-CM | POA: Insufficient documentation

## 2014-09-01 DIAGNOSIS — M1711 Unilateral primary osteoarthritis, right knee: Secondary | ICD-10-CM | POA: Diagnosis not present

## 2014-09-07 DIAGNOSIS — H43393 Other vitreous opacities, bilateral: Secondary | ICD-10-CM | POA: Diagnosis not present

## 2014-09-07 DIAGNOSIS — H5203 Hypermetropia, bilateral: Secondary | ICD-10-CM | POA: Diagnosis not present

## 2014-09-07 DIAGNOSIS — H2513 Age-related nuclear cataract, bilateral: Secondary | ICD-10-CM | POA: Diagnosis not present

## 2014-09-07 DIAGNOSIS — H3531 Nonexudative age-related macular degeneration: Secondary | ICD-10-CM | POA: Diagnosis not present

## 2014-09-07 DIAGNOSIS — H524 Presbyopia: Secondary | ICD-10-CM | POA: Diagnosis not present

## 2014-09-07 DIAGNOSIS — H52223 Regular astigmatism, bilateral: Secondary | ICD-10-CM | POA: Diagnosis not present

## 2014-09-24 ENCOUNTER — Other Ambulatory Visit (HOSPITAL_COMMUNITY): Payer: Self-pay | Admitting: Orthopedic Surgery

## 2014-10-22 NOTE — Pre-Procedure Instructions (Signed)
AZALEAH USMAN  10/22/2014      [id number] [place], [place] [address] [address] Phone: [phone] Fax: [phone]    Your procedure is scheduled on Wednesday, August 24th   Report to [facility] Admitting at 6:30 AM  Call this number if you have problems the morning of surgery:  [phone]   Remember:  Do not eat food or drink liquids after midnight Tuesday.  Take these medicines the morning of surgery with A SIP OF WATER: Omeprazole, Propranolol.              Please STOP taking any herbal medications and supplements for 4-5 days prior to surgery.    Do not wear jewelry, make-up or nail polish.  Do not wear lotions, powders, or perfumes.  You may NOT wear deodorant the day of surgery.  Do not shave 48 hours prior to surgery.     Do not bring valuables to the hospital.  [facility] is not responsible for any belongings or valuables.  Contacts, dentures or bridgework may not be worn into surgery.  Leave your suitcase in the car.  After surgery it may be brought to your room. For patients admitted to the hospital, discharge time will be determined by your treatment team.    Name and phone number of your driver:     Special instructions:  "Preparing for Surgery" instruction sheet.  Please read over the following fact sheets that you were given. Pain Booklet, Coughing and Deep Breathing, MRSA Information and Surgical Site Infection Prevention

## 2014-10-23 ENCOUNTER — Encounter (HOSPITAL_COMMUNITY): Payer: Self-pay

## 2014-10-23 ENCOUNTER — Encounter (HOSPITAL_COMMUNITY)
Admission: RE | Admit: 2014-10-23 | Discharge: 2014-10-23 | Disposition: A | Discharge status: Home / Self Care | Payer: Medicare PPO | Source: Ambulatory Visit | Attending: Orthopedic Surgery | Admitting: Orthopedic Surgery

## 2014-10-23 DIAGNOSIS — Z01818 Encounter for other preprocedural examination: Secondary | ICD-10-CM | POA: Insufficient documentation

## 2014-10-23 DIAGNOSIS — I341 Nonrheumatic mitral (valve) prolapse: Secondary | ICD-10-CM | POA: Insufficient documentation

## 2014-10-23 DIAGNOSIS — M179 Osteoarthritis of knee, unspecified: Secondary | ICD-10-CM | POA: Insufficient documentation

## 2014-10-23 DIAGNOSIS — Z01812 Encounter for preprocedural laboratory examination: Secondary | ICD-10-CM | POA: Insufficient documentation

## 2014-10-23 DIAGNOSIS — M1710 Unilateral primary osteoarthritis, unspecified knee: Secondary | ICD-10-CM

## 2014-10-23 DIAGNOSIS — I10 Essential (primary) hypertension: Secondary | ICD-10-CM | POA: Insufficient documentation

## 2014-10-23 HISTORY — DX: Chronic kidney disease, unspecified: N18.9

## 2014-10-23 HISTORY — DX: Other complications of anesthesia, initial encounter: T88.59XA

## 2014-10-23 HISTORY — DX: Adverse effect of unspecified anesthetic, initial encounter: T41.45XA

## 2014-10-23 HISTORY — DX: Unspecified osteoarthritis, unspecified site: M19.90

## 2014-10-23 HISTORY — DX: Anxiety disorder, unspecified: F41.9

## 2014-10-23 HISTORY — DX: Other specified postprocedural states: R11.2

## 2014-10-23 HISTORY — DX: Other specified postprocedural states: Z98.890

## 2014-10-23 HISTORY — DX: Diverticulosis of intestine, part unspecified, without perforation or abscess without bleeding: K57.90

## 2014-10-23 HISTORY — DX: Gastro-esophageal reflux disease without esophagitis: K21.9

## 2014-10-23 LAB — COMPREHENSIVE METABOLIC PANEL
ALBUMIN: 4 g/dL (ref 3.5–5.0)
ALK PHOS: 69 U/L (ref 38–126)
ALT: 19 U/L (ref 14–54)
AST: 27 U/L (ref 15–41)
Anion gap: 7 (ref 5–15)
BUN: 19 mg/dL (ref 6–20)
CO2: 25 mmol/L (ref 22–32)
Calcium: 10.8 mg/dL — ABNORMAL HIGH (ref 8.9–10.3)
Chloride: 109 mmol/L (ref 101–111)
Creatinine, Ser: 1.26 mg/dL — ABNORMAL HIGH (ref 0.44–1.00)
GFR calc non Af Amer: 42 mL/min — ABNORMAL LOW (ref >60)
GFR, EST AFRICAN AMERICAN: 48 mL/min — AB (ref >60)
Glucose, Bld: 93 mg/dL (ref 65–99)
POTASSIUM: 5.1 mmol/L (ref 3.5–5.1)
SODIUM: 141 mmol/L (ref 135–145)
TOTAL PROTEIN: 6.9 g/dL (ref 6.5–8.1)
Total Bilirubin: 0.6 mg/dL (ref 0.3–1.2)

## 2014-10-23 LAB — SURGICAL PCR SCREEN
MRSA, PCR: NEGATIVE
STAPHYLOCOCCUS AUREUS: NEGATIVE

## 2014-10-23 LAB — CBC
HCT: 39.5 % (ref 36.0–46.0)
Hemoglobin: 13.1 g/dL (ref 12.0–15.0)
MCH: 32.8 pg (ref 26.0–34.0)
MCHC: 33.2 g/dL (ref 30.0–36.0)
MCV: 99 fL (ref 78.0–100.0)
Platelets: 290 K/uL (ref 150–400)
RBC: 3.99 MIL/uL (ref 3.87–5.11)
RDW: 14.1 % (ref 11.5–15.5)
WBC: 7.4 K/uL (ref 4.0–10.5)

## 2014-10-23 LAB — PROTIME-INR
INR: 1.02 (ref 0.00–1.49)
PROTHROMBIN TIME: 13.6 s (ref 11.6–15.2)

## 2014-10-23 LAB — APTT: aPTT: 30 s (ref 24–37)

## 2014-10-23 NOTE — Progress Notes (Addendum)
Office visit note from Dr. Cathie Olden inside chart under cardio.  DA  LOV 2 yrs ago. PCP is Theadore Nan,  MD  Saw in Feb 2016.  Received the OK to have surgery from her.  Had a lot of stress when husband died 5 yrs ago, went to cardio and was told to use omeprazole.  "gradually has gotten to point where it will flare up once and a while...Marland Kitchen"

## 2014-11-03 MED ORDER — CHLORHEXIDINE GLUCONATE 4 % EX LIQD: 60.0000 mL | Freq: Once | CUTANEOUS | Status: DC

## 2014-11-03 MED ORDER — CLINDAMYCIN PHOSPHATE 900 MG/50ML IV SOLN
900.0000 mg | INTRAVENOUS | Status: AC
  Administered 2014-11-04: 900 mg via INTRAVENOUS
  Filled 2014-11-03: qty 50

## 2014-11-04 ENCOUNTER — Inpatient Hospital Stay (HOSPITAL_COMMUNITY): Payer: Medicare PPO | Admitting: Anesthesiology

## 2014-11-04 ENCOUNTER — Encounter (HOSPITAL_COMMUNITY)
Admission: RE | Disposition: A | Discharge status: Skilled Nursing Facility | Payer: Self-pay | Source: Ambulatory Visit | Attending: Orthopedic Surgery

## 2014-11-04 ENCOUNTER — Inpatient Hospital Stay (HOSPITAL_COMMUNITY)
Admission: RE | Admit: 2014-11-04 | Discharge: 2014-11-06 | DRG: 470 | Disposition: A | Discharge status: Skilled Nursing Facility | Payer: Medicare PPO | Source: Ambulatory Visit | Attending: Orthopedic Surgery | Admitting: Orthopedic Surgery

## 2014-11-04 DIAGNOSIS — F419 Anxiety disorder, unspecified: Secondary | ICD-10-CM | POA: Diagnosis not present

## 2014-11-04 DIAGNOSIS — N183 Chronic kidney disease, stage 3 (moderate): Secondary | ICD-10-CM | POA: Diagnosis present

## 2014-11-04 DIAGNOSIS — Z888 Allergy status to other drugs, medicaments and biological substances status: Secondary | ICD-10-CM | POA: Diagnosis not present

## 2014-11-04 DIAGNOSIS — M1990 Unspecified osteoarthritis, unspecified site: Secondary | ICD-10-CM | POA: Diagnosis not present

## 2014-11-04 DIAGNOSIS — Z96651 Presence of right artificial knee joint: Secondary | ICD-10-CM | POA: Diagnosis not present

## 2014-11-04 DIAGNOSIS — I341 Nonrheumatic mitral (valve) prolapse: Secondary | ICD-10-CM | POA: Diagnosis not present

## 2014-11-04 DIAGNOSIS — M1711 Unilateral primary osteoarthritis, right knee: Secondary | ICD-10-CM | POA: Diagnosis not present

## 2014-11-04 DIAGNOSIS — I129 Hypertensive chronic kidney disease with stage 1 through stage 4 chronic kidney disease, or unspecified chronic kidney disease: Secondary | ICD-10-CM | POA: Diagnosis not present

## 2014-11-04 DIAGNOSIS — Z96659 Presence of unspecified artificial knee joint: Secondary | ICD-10-CM

## 2014-11-04 DIAGNOSIS — Z981 Arthrodesis status: Secondary | ICD-10-CM

## 2014-11-04 DIAGNOSIS — Z7982 Long term (current) use of aspirin: Secondary | ICD-10-CM | POA: Diagnosis not present

## 2014-11-04 DIAGNOSIS — M179 Osteoarthritis of knee, unspecified: Secondary | ICD-10-CM | POA: Diagnosis not present

## 2014-11-04 DIAGNOSIS — E785 Hyperlipidemia, unspecified: Secondary | ICD-10-CM | POA: Diagnosis not present

## 2014-11-04 DIAGNOSIS — Z90710 Acquired absence of both cervix and uterus: Secondary | ICD-10-CM | POA: Diagnosis not present

## 2014-11-04 DIAGNOSIS — G8918 Other acute postprocedural pain: Secondary | ICD-10-CM | POA: Diagnosis not present

## 2014-11-04 DIAGNOSIS — Z880 Allergy status to penicillin: Secondary | ICD-10-CM

## 2014-11-04 DIAGNOSIS — Z79899 Other long term (current) drug therapy: Secondary | ICD-10-CM | POA: Diagnosis not present

## 2014-11-04 DIAGNOSIS — Z885 Allergy status to narcotic agent status: Secondary | ICD-10-CM | POA: Diagnosis not present

## 2014-11-04 DIAGNOSIS — Z471 Aftercare following joint replacement surgery: Secondary | ICD-10-CM | POA: Diagnosis not present

## 2014-11-04 DIAGNOSIS — K219 Gastro-esophageal reflux disease without esophagitis: Secondary | ICD-10-CM | POA: Diagnosis not present

## 2014-11-04 HISTORY — PX: TOTAL KNEE ARTHROPLASTY: SHX125

## 2014-11-04 SURGERY — ARTHROPLASTY, KNEE, TOTAL
Anesthesia: Regional | Site: Knee | Laterality: Right

## 2014-11-04 MED ORDER — HYDROMORPHONE HCL 1 MG/ML IJ SOLN
0.2500 mg | INTRAMUSCULAR | Status: DC | PRN
  Administered 2014-11-04 (×5): 0.5 mg via INTRAVENOUS

## 2014-11-04 MED ORDER — MIDAZOLAM HCL 5 MG/5ML IJ SOLN
INTRAMUSCULAR | Status: DC | PRN
  Administered 2014-11-04 (×2): 2 mg via INTRAVENOUS

## 2014-11-04 MED ORDER — ASPIRIN EC 325 MG PO TBEC
325.0000 mg | DELAYED_RELEASE_TABLET | Freq: Every day | ORAL | Status: DC
  Administered 2014-11-05 – 2014-11-06 (×2): 325 mg via ORAL
  Filled 2014-11-04 (×2): qty 1

## 2014-11-04 MED ORDER — KETAMINE HCL 100 MG/ML IJ SOLN
INTRAMUSCULAR | Status: DC | PRN
  Administered 2014-11-04 (×4): 25 mg via INTRAVENOUS
  Administered 2014-11-04: 17 mg via INTRAVENOUS

## 2014-11-04 MED ORDER — PHENOL 1.4 % MT LIQD: 1.0000 | OROMUCOSAL | Status: DC | PRN

## 2014-11-04 MED ORDER — METHOCARBAMOL 1000 MG/10ML IJ SOLN
500.0000 mg | INTRAVENOUS | Status: AC
  Administered 2014-11-04: 500 mg via INTRAVENOUS
  Filled 2014-11-04: qty 5

## 2014-11-04 MED ORDER — FENTANYL CITRATE (PF) 250 MCG/5ML IJ SOLN
INTRAMUSCULAR | Status: AC
  Filled 2014-11-04: qty 5

## 2014-11-04 MED ORDER — MIDAZOLAM HCL 2 MG/2ML IJ SOLN
INTRAMUSCULAR | Status: AC
  Filled 2014-11-04: qty 4

## 2014-11-04 MED ORDER — CLINDAMYCIN PHOSPHATE 600 MG/50ML IV SOLN
600.0000 mg | Freq: Four times a day (QID) | INTRAVENOUS | Status: AC
  Administered 2014-11-04 (×2): 600 mg via INTRAVENOUS
  Filled 2014-11-04 (×2): qty 50

## 2014-11-04 MED ORDER — KETAMINE HCL 100 MG/ML IJ SOLN
INTRAMUSCULAR | Status: AC
  Filled 2014-11-04: qty 1

## 2014-11-04 MED ORDER — SODIUM CHLORIDE 0.9 % IR SOLN
Status: DC | PRN
  Administered 2014-11-04: 3000 mL

## 2014-11-04 MED ORDER — OXYCODONE HCL 5 MG PO TABS
5.0000 mg | ORAL_TABLET | ORAL | Status: DC | PRN
  Administered 2014-11-04 – 2014-11-05 (×6): 10 mg via ORAL
  Administered 2014-11-05: 5 mg via ORAL
  Administered 2014-11-05 – 2014-11-06 (×4): 10 mg via ORAL
  Filled 2014-11-04 (×6): qty 2
  Filled 2014-11-04: qty 1
  Filled 2014-11-04 (×3): qty 2

## 2014-11-04 MED ORDER — ONDANSETRON HCL 4 MG/2ML IJ SOLN
INTRAMUSCULAR | Status: DC | PRN
  Administered 2014-11-04: 4 mg via INTRAVENOUS

## 2014-11-04 MED ORDER — MAGNESIUM CITRATE PO SOLN: 1.0000 | Freq: Once | ORAL | Status: DC | PRN

## 2014-11-04 MED ORDER — METOCLOPRAMIDE HCL 5 MG/ML IJ SOLN: 5.0000 mg | Freq: Three times a day (TID) | INTRAMUSCULAR | Status: DC | PRN

## 2014-11-04 MED ORDER — HYDROMORPHONE HCL 1 MG/ML IJ SOLN
1.0000 mg | INTRAMUSCULAR | Status: DC | PRN
  Filled 2014-11-04: qty 1

## 2014-11-04 MED ORDER — SODIUM CHLORIDE 0.9 % IV SOLN
INTRAVENOUS | Status: DC
  Administered 2014-11-04: 12:00:00 via INTRAVENOUS

## 2014-11-04 MED ORDER — LACTATED RINGERS IV SOLN
INTRAVENOUS | Status: DC | PRN
Start: 2014-11-04 — End: 2014-11-04
  Administered 2014-11-04 (×2): via INTRAVENOUS

## 2014-11-04 MED ORDER — BUPIVACAINE LIPOSOME 1.3 % IJ SUSP
20.0000 mL | INTRAMUSCULAR | Status: AC
  Administered 2014-11-04: 20 mL
  Filled 2014-11-04 (×2): qty 20

## 2014-11-04 MED ORDER — ONDANSETRON HCL 4 MG/2ML IJ SOLN
INTRAMUSCULAR | Status: AC
  Filled 2014-11-04: qty 2

## 2014-11-04 MED ORDER — DEXAMETHASONE SODIUM PHOSPHATE 10 MG/ML IJ SOLN
INTRAMUSCULAR | Status: AC
  Filled 2014-11-04: qty 1

## 2014-11-04 MED ORDER — DOCUSATE SODIUM 100 MG PO CAPS
100.0000 mg | ORAL_CAPSULE | Freq: Two times a day (BID) | ORAL | Status: DC
Start: 2014-11-04 — End: 2014-11-06
  Administered 2014-11-04 – 2014-11-05 (×4): 100 mg via ORAL
  Filled 2014-11-04 (×5): qty 1

## 2014-11-04 MED ORDER — KETOROLAC TROMETHAMINE 15 MG/ML IJ SOLN
7.5000 mg | Freq: Four times a day (QID) | INTRAMUSCULAR | Status: AC
  Filled 2014-11-04: qty 1

## 2014-11-04 MED ORDER — HYDROMORPHONE HCL 1 MG/ML IJ SOLN
INTRAMUSCULAR | Status: AC
  Administered 2014-11-04: 0.5 mg via INTRAVENOUS
  Filled 2014-11-04: qty 1

## 2014-11-04 MED ORDER — PROPOFOL 10 MG/ML IV BOLUS
INTRAVENOUS | Status: AC
  Filled 2014-11-04: qty 20

## 2014-11-04 MED ORDER — SCOPOLAMINE 1 MG/3DAYS TD PT72
1.0000 | MEDICATED_PATCH | Freq: Once | TRANSDERMAL | Status: DC
  Administered 2014-11-04: 1.5 mg via TRANSDERMAL
  Filled 2014-11-04: qty 1

## 2014-11-04 MED ORDER — NEOSTIGMINE METHYLSULFATE 10 MG/10ML IV SOLN
INTRAVENOUS | Status: AC
  Filled 2014-11-04: qty 1

## 2014-11-04 MED ORDER — PROPOFOL 10 MG/ML IV BOLUS
INTRAVENOUS | Status: DC | PRN
  Administered 2014-11-04: 50 mg via INTRAVENOUS
  Administered 2014-11-04: 150 mg via INTRAVENOUS

## 2014-11-04 MED ORDER — PROPOFOL INFUSION 10 MG/ML OPTIME
INTRAVENOUS | Status: DC | PRN
  Administered 2014-11-04: 100 ug/kg/min via INTRAVENOUS

## 2014-11-04 MED ORDER — METHOCARBAMOL 500 MG PO TABS
500.0000 mg | ORAL_TABLET | Freq: Four times a day (QID) | ORAL | Status: DC | PRN
  Administered 2014-11-05: 500 mg via ORAL
  Filled 2014-11-04: qty 1

## 2014-11-04 MED ORDER — ONDANSETRON HCL 4 MG PO TABS
4.0000 mg | ORAL_TABLET | Freq: Four times a day (QID) | ORAL | Status: DC | PRN
Start: 2014-11-04 — End: 2014-11-06

## 2014-11-04 MED ORDER — KETAMINE HCL 100 MG/ML IJ SOLN: INTRAMUSCULAR | Status: DC | PRN

## 2014-11-04 MED ORDER — DEXAMETHASONE SODIUM PHOSPHATE 10 MG/ML IJ SOLN
INTRAMUSCULAR | Status: DC | PRN
  Administered 2014-11-04: 10 mg via INTRAVENOUS

## 2014-11-04 MED ORDER — BUPIVACAINE-EPINEPHRINE (PF) 0.5% -1:200000 IJ SOLN
INTRAMUSCULAR | Status: DC | PRN
  Administered 2014-11-04: 30 mL via PERINEURAL

## 2014-11-04 MED ORDER — OXYCODONE HCL 5 MG PO TABS
ORAL_TABLET | ORAL | Status: AC
  Filled 2014-11-04: qty 2

## 2014-11-04 MED ORDER — METOCLOPRAMIDE HCL 5 MG PO TABS: 5.0000 mg | ORAL_TABLET | Freq: Three times a day (TID) | ORAL | Status: DC | PRN

## 2014-11-04 MED ORDER — BISACODYL 5 MG PO TBEC: 5.0000 mg | DELAYED_RELEASE_TABLET | Freq: Every day | ORAL | Status: DC | PRN

## 2014-11-04 MED ORDER — 0.9 % SODIUM CHLORIDE (POUR BTL) OPTIME
TOPICAL | Status: DC | PRN
  Administered 2014-11-04: 1000 mL

## 2014-11-04 MED ORDER — MENTHOL 3 MG MT LOZG: 1.0000 | LOZENGE | OROMUCOSAL | Status: DC | PRN

## 2014-11-04 MED ORDER — METHOCARBAMOL 1000 MG/10ML IJ SOLN
500.0000 mg | Freq: Four times a day (QID) | INTRAVENOUS | Status: DC | PRN
  Filled 2014-11-04: qty 5

## 2014-11-04 MED ORDER — POLYETHYLENE GLYCOL 3350 17 G PO PACK: 17.0000 g | PACK | Freq: Every day | ORAL | Status: DC | PRN

## 2014-11-04 MED ORDER — ACETAMINOPHEN 10 MG/ML IV SOLN
1000.0000 mg | INTRAVENOUS | Status: AC
  Administered 2014-11-04: 1000 mg via INTRAVENOUS
  Filled 2014-11-04: qty 100

## 2014-11-04 MED ORDER — ACETAMINOPHEN 650 MG RE SUPP: 650.0000 mg | Freq: Four times a day (QID) | RECTAL | Status: DC | PRN

## 2014-11-04 MED ORDER — ONDANSETRON HCL 4 MG/2ML IJ SOLN: 4.0000 mg | Freq: Four times a day (QID) | INTRAMUSCULAR | Status: DC | PRN

## 2014-11-04 MED ORDER — GLYCOPYRROLATE 0.2 MG/ML IJ SOLN
INTRAMUSCULAR | Status: AC
  Filled 2014-11-04: qty 4

## 2014-11-04 MED ORDER — ROCURONIUM BROMIDE 50 MG/5ML IV SOLN
INTRAVENOUS | Status: AC
  Filled 2014-11-04: qty 1

## 2014-11-04 MED ORDER — OMEPRAZOLE 20 MG PO CPDR
20.0000 mg | DELAYED_RELEASE_CAPSULE | Freq: Every day | ORAL | Status: DC | PRN
  Filled 2014-11-04: qty 1

## 2014-11-04 MED ORDER — LIDOCAINE HCL (CARDIAC) 20 MG/ML IV SOLN
INTRAVENOUS | Status: AC
  Filled 2014-11-04: qty 5

## 2014-11-04 MED ORDER — ACETAMINOPHEN 325 MG PO TABS
650.0000 mg | ORAL_TABLET | Freq: Four times a day (QID) | ORAL | Status: DC | PRN
Start: 2014-11-04 — End: 2014-11-06

## 2014-11-04 MED ORDER — TRANEXAMIC ACID 1000 MG/10ML IV SOLN
2000.0000 mg | INTRAVENOUS | Status: AC
  Administered 2014-11-04: 2000 mg via TOPICAL
  Filled 2014-11-04: qty 20

## 2014-11-04 MED ORDER — FENTANYL CITRATE (PF) 100 MCG/2ML IJ SOLN
INTRAMUSCULAR | Status: DC | PRN
  Administered 2014-11-04: 100 ug via INTRAVENOUS
  Administered 2014-11-04 (×3): 50 ug via INTRAVENOUS

## 2014-11-04 MED ORDER — LISINOPRIL 5 MG PO TABS
5.0000 mg | ORAL_TABLET | Freq: Every day | ORAL | Status: DC
Start: 2014-11-04 — End: 2014-11-06
  Administered 2014-11-04 – 2014-11-06 (×3): 5 mg via ORAL
  Filled 2014-11-04 (×3): qty 1

## 2014-11-04 SURGICAL SUPPLY — 53 items
BAG DECANTER FOR FLEXI CONT (MISCELLANEOUS) ×3 IMPLANT
BLADE SAG 18X100X1.27 (BLADE) ×3 IMPLANT
BLADE SAGITTAL 25.0X1.27X90 (BLADE) ×2 IMPLANT
BLADE SAGITTAL 25.0X1.27X90MM (BLADE) ×1
BLADE SURG 21 STRL SS (BLADE) ×6 IMPLANT
BNDG COHESIVE 6X5 TAN STRL LF (GAUZE/BANDAGES/DRESSINGS) ×3 IMPLANT
BNDG GAUZE ELAST 4 BULKY (GAUZE/BANDAGES/DRESSINGS) ×3 IMPLANT
BONE CEMENT PALACOSE (Orthopedic Implant) ×6 IMPLANT
BOWL SMART MIX CTS (DISPOSABLE) ×3 IMPLANT
CAPT KNEE TOTAL 3 ×3 IMPLANT
CEMENT BONE PALACOSE (Orthopedic Implant) ×2 IMPLANT
COVER SURGICAL LIGHT HANDLE (MISCELLANEOUS) ×3 IMPLANT
CUFF TOURNIQUET SINGLE 34IN LL (TOURNIQUET CUFF) ×3 IMPLANT
CUFF TOURNIQUET SINGLE 44IN (TOURNIQUET CUFF) IMPLANT
DRAPE EXTREMITY T 121X128X90 (DRAPE) ×3 IMPLANT
DRAPE PROXIMA HALF (DRAPES) ×3 IMPLANT
DRAPE U-SHAPE 47X51 STRL (DRAPES) ×3 IMPLANT
DRSG ADAPTIC 3X8 NADH LF (GAUZE/BANDAGES/DRESSINGS) ×3 IMPLANT
DRSG PAD ABDOMINAL 8X10 ST (GAUZE/BANDAGES/DRESSINGS) ×3 IMPLANT
DURAPREP 26ML APPLICATOR (WOUND CARE) ×3 IMPLANT
ELECT REM PT RETURN 9FT ADLT (ELECTROSURGICAL) ×3
ELECTRODE REM PT RTRN 9FT ADLT (ELECTROSURGICAL) ×1 IMPLANT
FACESHIELD WRAPAROUND (MASK) ×3 IMPLANT
GAUZE SPONGE 4X4 12PLY STRL (GAUZE/BANDAGES/DRESSINGS) ×3 IMPLANT
GLOVE BIOGEL PI IND STRL 9 (GLOVE) ×1 IMPLANT
GLOVE BIOGEL PI INDICATOR 9 (GLOVE) ×2
GLOVE SURG ORTHO 9.0 STRL STRW (GLOVE) ×3 IMPLANT
GOWN STRL REUS W/ TWL XL LVL3 (GOWN DISPOSABLE) ×2 IMPLANT
GOWN STRL REUS W/TWL XL LVL3 (GOWN DISPOSABLE) ×4
HANDPIECE INTERPULSE COAX TIP (DISPOSABLE) ×2
KIT BASIN OR (CUSTOM PROCEDURE TRAY) ×3 IMPLANT
KIT ROOM TURNOVER OR (KITS) ×3 IMPLANT
MANIFOLD NEPTUNE II (INSTRUMENTS) ×3 IMPLANT
NEEDLE SPNL 18GX3.5 QUINCKE PK (NEEDLE) ×3 IMPLANT
NS IRRIG 1000ML POUR BTL (IV SOLUTION) ×3 IMPLANT
PACK TOTAL JOINT (CUSTOM PROCEDURE TRAY) ×3 IMPLANT
PACK UNIVERSAL I (CUSTOM PROCEDURE TRAY) ×3 IMPLANT
PAD ARMBOARD 7.5X6 YLW CONV (MISCELLANEOUS) ×6 IMPLANT
PADDING CAST COTTON 6X4 STRL (CAST SUPPLIES) IMPLANT
SET HNDPC FAN SPRY TIP SCT (DISPOSABLE) ×1 IMPLANT
STAPLER VISISTAT 35W (STAPLE) ×3 IMPLANT
SUCTION FRAZIER TIP 10 FR DISP (SUCTIONS) ×3 IMPLANT
SUT VIC AB 0 CTB1 27 (SUTURE) ×6 IMPLANT
SUT VIC AB 1 CTX 36 (SUTURE) ×2
SUT VIC AB 1 CTX36XBRD ANBCTR (SUTURE) ×1 IMPLANT
SYR 50ML LL SCALE MARK (SYRINGE) ×3 IMPLANT
TOWEL OR 17X24 6PK STRL BLUE (TOWEL DISPOSABLE) ×3 IMPLANT
TOWEL OR 17X26 10 PK STRL BLUE (TOWEL DISPOSABLE) ×3 IMPLANT
TRAY FOLEY CATH 16FRSI W/METER (SET/KITS/TRAYS/PACK) IMPLANT
TUBE CONNECTING 12'X1/4 (SUCTIONS) ×1
TUBE CONNECTING 12X1/4 (SUCTIONS) ×2 IMPLANT
WRAP KNEE MAXI GEL POST OP (GAUZE/BANDAGES/DRESSINGS) ×3 IMPLANT
YANKAUER SUCT BULB TIP NO VENT (SUCTIONS) ×3 IMPLANT

## 2014-11-04 NOTE — Anesthesia Procedure Notes (Addendum)
Anesthesia Regional Block:  Femoral nerve block  Pre-Anesthetic Checklist: ,, timeout performed, Correct Patient, Correct Site, Correct Laterality, Correct Procedure, Correct Position, site marked, Risks and benefits discussed, pre-op evaluation,  At surgeon's request and post-op pain management  Laterality: Right  Prep: Maximum Sterile Barrier Precautions used and chloraprep       Needles:  Injection technique: Single-shot  Needle Type: Echogenic Stimulator Needle     Needle Length: 5cm 5 cm Needle Gauge: 22 and 22 G    Additional Needles:  Procedures: ultrasound guided (picture in chart) Femoral nerve block  Nerve Stimulator or Paresthesia:  Response: Patellar respose,   Additional Responses:   Narrative:  Start time: 11/04/2014 8:14 AM End time: 11/04/2014 8:22 AM Injection made incrementally with aspirations every 5 mL. Anesthesiologist: Roderic Palau  Additional Notes: 2% Lidocaine skin wheel.    Procedure Name: LMA Insertion Date/Time: 11/04/2014 8:40 AM Performed by: Ignacia Bayley Pre-anesthesia Checklist: Patient identified Patient Re-evaluated:Patient Re-evaluated prior to inductionOxygen Delivery Method: Circle system utilized Preoxygenation: Pre-oxygenation with 100% oxygen Intubation Type: IV induction Ventilation: Mask ventilation without difficulty LMA: LMA inserted LMA Size: 4.0 Number of attempts: 1 Placement Confirmation: positive ETCO2 and breath sounds checked- equal and bilateral Tube secured with: Tape Dental Injury: Teeth and Oropharynx as per pre-operative assessment

## 2014-11-04 NOTE — Evaluation (Signed)
Physical Therapy Evaluation Patient Details Name: Susan Nunez MRN: [id number] DOB: 04/12/1940 Today's Date: 11/04/2014   History of Present Illness  Patient is a 74 y/o female s/p Rt TKA. PMH includes HTN, mitral valve prolapse, anxiety, CKD.   Clinical Impression  Patient presents with pain, lethargy, nausea, dizziness and post surgical deficits RLE s/p R TKA. Pt only agreeable to sitting EOB today due to above. Difficulty keeping eyes opened during session. Nausea and impaired sensation RLE due to nerve block. Instructed pt in exercises. Pt independent PTA. Plans to d/c to ST SNF to maximize independence and mobility. Will follow acutely to perform further assessment of mobility.     Follow Up Recommendations SNF;Supervision/Assistance - 24 hour    Equipment Recommendations  Rolling walker with 5" wheels    Recommendations for Other Services       Precautions / Restrictions Precautions Precautions: Knee Precaution Booklet Issued: No Precaution Comments: Reviewed no pillow under knee Restrictions Weight Bearing Restrictions: Yes RLE Weight Bearing: Weight bearing as tolerated      Mobility  Bed Mobility Overal bed mobility: Needs Assistance Bed Mobility: Supine to Sit     Supine to sit: Min assist;HOB elevated     General bed mobility comments: Min A to bring RLE to EOB. Increased time and cues for sequencing. Use of rail for support. + nausea, lethargy.  Transfers Overall transfer level:  (Pt decline dstanding due to nausea, lethargy and impaired sensation RLE.)                  Ambulation/Gait                Stairs            Wheelchair Mobility    Modified Rankin (Stroke Patients Only)       Balance Overall balance assessment: Needs assistance Sitting-balance support: Feet supported;Single extremity supported Sitting balance-Leahy Scale: Fair Sitting balance - Comments: Pt witih occasional sway posteriorly and anteriorly with eyes  closed due to lethargy and dizziness. No LOB. Postural control: Posterior lean                                   Pertinent Vitals/Pain Pain Assessment: Faces Faces Pain Scale: Hurts even more Pain Location: right knee with movement Pain Descriptors / Indicators: Sore;Aching Pain Intervention(s): Limited activity within patient's tolerance;Monitored during session;Repositioned;Patient requesting pain meds-RN notified;Ice applied    Home Living Family/patient expects to be discharged to:: Skilled nursing facility Living Arrangements: Alone                    Prior Function Level of Independence: Independent               Hand Dominance        Extremity/Trunk Assessment   Upper Extremity Assessment: Defer to OT evaluation           Lower Extremity Assessment: RLE deficits/detail RLE Deficits / Details: Limited AROM/strength secondary to pain and surgery.       Communication   Communication: No difficulties  Cognition Arousal/Alertness: Lethargic;Suspect due to medications Behavior During Therapy: [facility] for tasks assessed/performed Overall Cognitive Status: Within Functional Limits for tasks assessed                      General Comments      Exercises Total Joint Exercises Ankle Circles/Pumps: Both;10 reps;Supine Quad Sets: Both;10  reps;Supine Gluteal Sets: Both;10 reps;Supine      Assessment/Plan    PT Assessment Patient needs continued PT services  PT Diagnosis Acute pain;Generalized weakness   PT Problem List Decreased strength;Pain;Decreased range of motion;Decreased cognition;Impaired sensation;Decreased activity tolerance;Decreased balance;Decreased mobility;Decreased knowledge of use of DME  PT Treatment Interventions Balance training;Gait training;Stair training;Patient/family education;Therapeutic exercise;Therapeutic activities;Functional mobility training;DME instruction   PT Goals (Current goals can be found in  the Care Plan section) Acute Rehab PT Goals Patient Stated Goal: to go to rehab PT Goal Formulation: With patient Time For Goal Achievement: 11/18/14 Potential to Achieve Goals: Good    Frequency 7X/week   Barriers to discharge Decreased caregiver support      Co-evaluation               End of Session Equipment Utilized During Treatment: Gait belt Activity Tolerance: Patient limited by lethargy;Patient limited by pain Patient left: in bed;with call bell/phone within reach;with bed alarm set;with SCD's reapplied Nurse Communication: Mobility status         Time: 9794-8016 PT Time Calculation (min) (ACUTE ONLY): 19 min   Charges:   PT Evaluation $Initial PT Evaluation Tier I: 1 Procedure     PT G Codes:        Codylee Patil A Lekita Kerekes 11/04/2014, 5:04 PM Wray Kearns, [place], DPT [phone]

## 2014-11-04 NOTE — Transfer of Care (Signed)
Immediate Anesthesia Transfer of Care Note  Patient: Susan Nunez  Procedure(s) Performed: Procedure(s): RIGHT TOTAL KNEE ARTHROPLASTY (Right)  Patient Location: PACU  Anesthesia Type:General  Level of Consciousness: awake and sedated  Airway & Oxygen Therapy: Patient Spontanous Breathing and Patient connected to nasal cannula oxygen  Post-op Assessment: Report given to RN and Post -op Vital signs reviewed and stable  Post vital signs: stable  Last Vitals:  Filed Vitals:   11/04/14 1011  BP: 112/71  Pulse: 76  Temp: 35.9 C  Resp: 20    Complications: No apparent anesthesia complications

## 2014-11-04 NOTE — Progress Notes (Signed)
Utilization review completed.  

## 2014-11-04 NOTE — Anesthesia Preprocedure Evaluation (Addendum)
Anesthesia Evaluation  Patient identified by MRN, date of birth, ID band Patient awake    Reviewed: Allergy & Precautions, H&P , NPO status , Patient's Chart, lab work & pertinent test results  History of Anesthesia Complications (+) PONV  Airway Mallampati: II  TM Distance: >3 FB Neck ROM: Full    Dental no notable dental hx. (+) Teeth Intact, Dental Advisory Given   Pulmonary neg pulmonary ROS,  breath sounds clear to auscultation  Pulmonary exam normal       Cardiovascular hypertension, Pt. on medications Rhythm:Regular Rate:Normal     Neuro/Psych Anxiety negative neurological ROS  negative psych ROS   GI/Hepatic Neg liver ROS, GERD-  Medicated and Controlled,  Endo/Other  negative endocrine ROS  Renal/GU Renal InsufficiencyRenal disease  negative genitourinary   Musculoskeletal  (+) Arthritis -, Osteoarthritis,    Abdominal   Peds  Hematology negative hematology ROS (+)   Anesthesia Other Findings   Reproductive/Obstetrics negative OB ROS                            Anesthesia Physical Anesthesia Plan  ASA: II  Anesthesia Plan: General and Regional   Post-op Pain Management: GA combined w/ Regional for post-op pain   Induction: Intravenous  Airway Management Planned: LMA  Additional Equipment:   Intra-op Plan:   Post-operative Plan: Extubation in OR  Informed Consent: I have reviewed the patients History and Physical, chart, labs and discussed the procedure including the risks, benefits and alternatives for the proposed anesthesia with the patient or authorized representative who has indicated his/her understanding and acceptance.   Dental advisory given  Plan Discussed with: CRNA  Anesthesia Plan Comments:         Anesthesia Quick Evaluation

## 2014-11-04 NOTE — H&P (Signed)
TOTAL KNEE ADMISSION H&P  Patient is being admitted for right total knee arthroplasty.  Subjective:  Chief Complaint:right knee pain.  HPI: Susan Nunez, 74 y.o. female, has a history of pain and functional disability in the right knee due to arthritis and has failed non-surgical conservative treatments for greater than 12 weeks to includeNSAID's and/or analgesics, corticosteriod injections, viscosupplementation injections and activity modification.  Onset of symptoms was gradual, starting 8 years ago with gradually worsening course since that time. The patient noted no past surgery on the right knee(s).  Patient currently rates pain in the right knee(s) at 8 out of 10 with activity. Patient has night pain, worsening of pain with activity and weight bearing, pain that interferes with activities of daily living, pain with passive range of motion, crepitus and joint swelling.  Patient has evidence of subchondral cysts, subchondral sclerosis, periarticular osteophytes and joint space narrowing by imaging studies. This patient has had avascular necrosis of the knee. There is no active infection.  Patient Active Problem List   Diagnosis Date Noted  . Chest pain 10/28/2010  . Hyperlipidemia 10/28/2010  . Mitral valve prolapse 10/28/2010   Past Medical History  Diagnosis Date  . Hyperlipidemia   . Hypertension   . Mitral valve prolapse     with mitral regurgitation   . Complication of anesthesia     difficulty waking up, difficulty peeing, extreme nausea & vomiting  . PONV (postoperative nausea and vomiting)   . Anxiety     when husband was ill and died  . Chronic kidney disease     stage 3....renal insufficiency  . GERD (gastroesophageal reflux disease)     not on daily basis  . Arthritis   . Diverticulosis     Past Surgical History  Procedure Laterality Date  . Cardiac catheterization  02/21/2010    Est. EF at 65% -- mooth and normal coronary arteries -- Normal left ventricular  systolic function.  We will continue with medical therapy.  I suspect her chest pain was due to her mitral valve prolapse syndrome --  Thayer Headings, M.D.   . Total vaginal hysterectomy  12/18/2001  . Foot surgery  07/12/00    right  . Knee arthroscopy  06/14/2009  . Lumbar spine surgery      lumbar spine fusion L5-S1 performed 20 years ago x 3  . Back surgery      x 3  . Abdominal hysterectomy    . Joint replacement      Prescriptions prior to admission  Medication Sig Dispense Refill Last Dose  . aspirin 81 MG tablet Take 81 mg by mouth daily.     Taking  . B Complex Vitamins (VITAMIN B COMPLEX PO) Take 1 tablet by mouth daily.    Taking  . Cholecalciferol (VITAMIN D3) 2000 UNITS TABS Take 2,000 Units by mouth daily.     Marland Kitchen lisinopril (PRINIVIL,ZESTRIL) 5 MG tablet Take 5 mg by mouth daily.     Taking  . Multiple Vitamins-Minerals (CENTRUM SILVER PO) Take 1 tablet by mouth daily.     . Multiple Vitamins-Minerals (EYE VITAMINS PO) Take 1 tablet by mouth daily.     . Omega-3 Fatty Acids (FISH OIL) 1200 MG CAPS Take 1,200 mg by mouth 2 (two) times a week.    Taking  . omeprazole (PRILOSEC OTC) 20 MG tablet Take 20 mg by mouth daily as needed (for heartburn).    Taking  . propranolol (INDERAL) 10 MG tablet Take 1  tablet (10 mg total) by mouth 2 (two) times daily as needed. 60 tablet 2 Taking   Allergies  Allergen Reactions  . Citalopram Other (See Comments)    Severe diarrhea, dizziness and memory loss  . Ciprofloxacin Hcl Diarrhea  . Demerol Other (See Comments)    Bumps at site of injection  . Flagyl [Metronidazole] Diarrhea and Nausea Only  . Lovastatin Other (See Comments)    Muscle aches   . Penicillins Itching and Rash  . Sertraline Hcl Other (See Comments)    Diarrhea and memory loss    Social History  Substance Use Topics  . Smoking status: Never Smoker   . Smokeless tobacco: Never Used  . Alcohol Use: No    Family History  Problem Relation Age of Onset  .  Coronary artery disease Father   . Heart attack Father   . Hypertension Mother      Review of Systems  All other systems reviewed and are negative.   Objective:  Physical Exam  Vital signs in last 24 hours:    Labs:   Estimated body mass index is 25.24 kg/(m^2) as calculated from the following:   Height as of 05/03/11: 5\' 9"  (1.753 m).   Weight as of 05/03/11: 77.565 kg (171 lb).   Imaging Review Plain radiographs demonstrate moderate degenerative joint disease of the right knee(s). The overall alignment ismild varus. The bone quality appears to be adequate for age and reported activity level.  Assessment/Plan:  End stage arthritis, right knee   The patient history, physical examination, clinical judgment of the provider and imaging studies are consistent with end stage degenerative joint disease of the right knee(s) and total knee arthroplasty is deemed medically necessary. The treatment options including medical management, injection therapy arthroscopy and arthroplasty were discussed at length. The risks and benefits of total knee arthroplasty were presented and reviewed. The risks due to aseptic loosening, infection, stiffness, patella tracking problems, thromboembolic complications and other imponderables were discussed. The patient acknowledged the explanation, agreed to proceed with the plan and consent was signed. Patient is being admitted for inpatient treatment for surgery, pain control, PT, OT, prophylactic antibiotics, VTE prophylaxis, progressive ambulation and ADL's and discharge planning. The patient is planning to be discharged home with home health services

## 2014-11-04 NOTE — Anesthesia Postprocedure Evaluation (Signed)
  Anesthesia Post-op Note  Patient: Susan Nunez  Procedure(s) Performed: Procedure(s): RIGHT TOTAL KNEE ARTHROPLASTY (Right)  Patient Location: PACU  Anesthesia Type:General and block  Level of Consciousness: awake and alert   Airway and Oxygen Therapy: Patient Spontanous Breathing  Post-op Pain: Controlled  Post-op Assessment: Post-op Vital signs reviewed, Patient's Cardiovascular Status Stable and Respiratory Function Stable  Post-op Vital Signs: Reviewed  Filed Vitals:   11/04/14 1100  BP: 116/63  Pulse: 68  Temp:   Resp: 19    Complications: No apparent anesthesia complications

## 2014-11-04 NOTE — Op Note (Signed)
11/04/2014  9:53 AM  PATIENT:  Susan Nunez    PRE-OPERATIVE DIAGNOSIS:  Right Knee Osteoarthritis  POST-OPERATIVE DIAGNOSIS:  Same  PROCEDURE:  RIGHT TOTAL KNEE ARTHROPLASTY St[employer]ker triathlon knee  SURGEON:  Newt Minion, MD  PHYSICIAN ASSISTANT:None ANESTHESIA:   General  PREOPERATIVE INDICATIONS:  [name] is a  74 y.o. female with a diagnosis of Right Knee Osteoarthritis who failed conservative measures and elected for surgical management.    The risks benefits and alternatives were discussed with the patient preoperatively including but not limited to the risks of infection, bleeding, nerve injury, cardiopulmonary complications, the need for revision surgery, among others, and the patient was willing to proceed.  OPERATIVE IMPLANTS: Size 4 femur size 5 tibia 13 mm polyethylene tray and 29 mm patella. Trans-Amick acid topical X Burrell injection  OPERATIVE FINDINGS: Bone quality good  OPERATIVE PROCEDURE: Patient was brought to the operating room after undergoing a femoral block she then underwent a general and aesthetic. After adequate levels anesthesia obtained patient's right lower extremity was prepped using DuraPrep draped into a sterile field and Ioban was used to cover all exposed skin. A timeout was called. A midline incision was made carried down to a medial parapatellar retinacular incision. Tension was first focused on the patella 9 mm was taken off the patella and the patella lug cuts were made for the size 29 patella. Attention was then focused on the femur. The medullary guide for the femur was used with 5 of valgus and 3 of external rotation. 8 mm was taken off the femur. Attention was then focused on the tibia. Tibial cut was made for 14 mm. Very thin sliver was taken medially. This sized for size 5 the alignment and rotation was checked and the keel cut was made for the size 5 tibia. The femur was sized for size 4 box cuts and chamfer cuts were made for the  size 4 femur. Trial components were placed in the knee was stable with full extension stable varus and valgus with a 13 mm polyethylene tray. The extension gap was measured and the flexion gap was measured. The trans-Amick acid was allowed to soak the X Burrell was injected in the popliteal fossa care not to have a and are an intravascular injection. The cement was mixed the tibia and femoral component was cemented in place the patella was placed and clamped and the polyethylene tray was placed. The knee was placed in extension until the cement hardened. The knee was irrigated with pulsatile lavage throughout the case. The patella tracked midline. The retinaculum was closed using #1 Vicryls subcutaneous is closed using 0 Vicryls skin was closed using staples. A Mepilex dressing was applied. Patient was extubated taken to the PACU in stable condition.

## 2014-11-05 ENCOUNTER — Encounter (HOSPITAL_COMMUNITY): Payer: Self-pay | Admitting: Orthopedic Surgery

## 2014-11-05 NOTE — Progress Notes (Signed)
Patient ID: Susan Nunez, female   DOB: 11-13-1940, 74 y.o.   MRN: [id number] Postoperative day 1 right total knee arthroplasty. Patient is comfortable with her CPM. She states she was up several times for ambulation through the night. Anticipate discharge to [facility] on Friday tomorrow.

## 2014-11-05 NOTE — Clinical Social Work Note (Signed)
Clinical Social Work Assessment  Patient Details  Name: Susan Nunez MRN: [id number] Date of Birth: 06-04-1940  Date of referral:  11/05/14               Reason for consult:  Discharge Planning, Facility Placement                Permission sought to share information with:  Chartered certified accountant granted to share information::  Yes, Verbal Permission Granted  Name::     n/a  Agency::  [facility]  Relationship::  n/a  Contact Information:  n/a  Housing/Transportation Living arrangements for the past 2 months:  Single Family Home Source of Information:  Patient Patient Interpreter Needed:  None Criminal Activity/Legal Involvement Pertinent to Current Situation/Hospitalization:  No - Comment as needed Significant Relationships:  Adult Children Lives with:  Self Do you feel safe going back to the place where you live?  No (High fall risk.) Need for family participation in patient care:  No (Coment) (Patient able to make own decisions.)  Care giving concerns:  Patient expressed no concerns at this time.   Social Worker assessment / plan:  CSW received referral for possible SNF placement at time of discharge. CSW met with patient to discuss discharge disposition. Per patient, patient has pre-registered with [facility] and will be admitted at time of discharge from [facility]. CSW to continue to follow and assist with discharge planning needs.  Employment status:  Retired Forensic scientist:  Programmer, applications ([place] ([place] PT Recommendations:  [place] / Referral to community resources:  [place]  Patient/Family's Response to care:  Patient understanding and agreeable to CSW plan of care.  Patient/Family's Understanding of and Emotional Response to Diagnosis, Current Treatment, and Prognosis:  Patient understanding and agreeable to CSW plan of care.  Emotional Assessment Appearance:  Appears stated  age Attitude/Demeanor/Rapport:  Other (Pleasant.) Affect (typically observed):  Accepting, Appropriate, Quiet, Pleasant Orientation:  Oriented to Self, Oriented to Place, Oriented to  Time, Oriented to Situation Alcohol / Substance use:  Not Applicable Psych involvement (Current and /or in the community):  No (Comment) (Not appropriate on this admission.)  Discharge Needs  Concerns to be addressed:  No discharge needs identified Readmission within the last 30 days:  No Current discharge risk:  None Barriers to Discharge:  No Barriers Identified   Caroline Sauger, LCSW 11/05/2014, 1:00 PM [phone]

## 2014-11-05 NOTE — Progress Notes (Signed)
Physical Therapy Treatment Patient Details Name: Susan Nunez MRN: [id number] DOB: June 02, 1940 Today's Date: 11/05/2014    History of Present Illness Patient is a 74 y/o female s/p Rt TKA. PMH includes HTN, mitral valve prolapse, anxiety, CKD.     PT Comments    Patient progressing very slowly with mobility. Very anxious. Tolerated SPT to [facility] and minimal ambulation with Mod A for balance/support. Requires encouragement for mobility. Increased pain. Appropriate for rehab prior to return home. Will follow acutely to maximize independence and mobility.  Follow Up Recommendations  SNF;Supervision/Assistance - 24 hour     Equipment Recommendations  Rolling walker with 5" wheels    Recommendations for Other Services       Precautions / Restrictions Precautions Precautions: Knee;Fall Precaution Booklet Issued: No Precaution Comments: Reviewed no pillow under knee Restrictions Weight Bearing Restrictions: Yes RLE Weight Bearing: Weight bearing as tolerated    Mobility  Bed Mobility Overal bed mobility: Needs Assistance Bed Mobility: Supine to Sit     Supine to sit: Min assist;HOB elevated     General bed mobility comments: Min A to bring RLE to EOB. Increased time and cues for sequencing. Use of rail for support.  Transfers Overall transfer level: Needs assistance Equipment used: Rolling walker (2 wheeled) Transfers: Sit to/from [facility] Sit to Stand: Mod assist Stand pivot transfers: Mod assist       General transfer comment: Mod A to boost from EOB with cues for hand placement/technique. Reluctant to place weight through RLE. SPT bed to [facility] with max cues as pt reaching for [facility] handle and trying to sit prior to being in front of it.  Ambulation/Gait Ambulation/Gait assistance: Mod assist Ambulation Distance (Feet): 5 Feet Assistive device: Rolling walker (2 wheeled) Gait Pattern/deviations: Step-to pattern;Decreased stance time - right;Decreased  step length - left;Trunk flexed;Shuffle     General Gait Details: Pt able to take a few steps to chair with max encouragement. Attempting to pivot foot instead of putting up feet. Mod A for balance/RW management. Cues to reach back for chair as pt attempting to sit prematurely. Very anxious.   Stairs            Wheelchair Mobility    Modified Rankin (Stroke Patients Only)       Balance Overall balance assessment: Needs assistance Sitting-balance support: Feet supported;No upper extremity supported Sitting balance-Leahy Scale: Fair     Standing balance support: During functional activity Standing balance-Leahy Scale: Poor Standing balance comment: Relient on RW and external support for balance.                    Cognition Arousal/Alertness: Awake/alert Behavior During Therapy: Anxious Overall Cognitive Status: Within Functional Limits for tasks assessed                      Exercises Total Joint Exercises Ankle Circles/Pumps: Both;10 reps;Seated Quad Sets: Both;10 reps;Seated Goniometric ROM: 12-78 degrees knee AROM    General Comments        Pertinent Vitals/Pain Pain Assessment: Faces Faces Pain Scale: Hurts whole lot Pain Location: right knee with movement Pain Descriptors / Indicators: Sore;Aching Pain Intervention(s): Limited activity within patient's tolerance;Monitored during session;Repositioned    Home Living                      Prior Function            PT Goals (current goals can now be found in  the care plan section) Progress towards PT goals: Progressing toward goals    Frequency  7X/week    PT Plan Current plan remains appropriate    Co-evaluation             End of Session Equipment Utilized During Treatment: Gait belt Activity Tolerance: Patient limited by pain Patient left: in chair;with call bell/phone within reach     Time: 1202-1222 PT Time Calculation (min) (ACUTE ONLY): 20  min  Charges:  $Therapeutic Activity: 8-22 mins                    G Codes:      Avelyn Touch A Laramie Gelles 11/05/2014, 1:38 PM Wray Kearns, [place], DPT [phone]

## 2014-11-05 NOTE — Progress Notes (Signed)
Orthopedic Tech Progress Note Patient Details:  Susan Nunez [birth year] [id number] On cpm at 6:50 pm Patient ID: [name], female   DOB: [birth year], 74 y.o.   MRN: [id number]   Braulio Bosch 11/05/2014, 6:54 PM

## 2014-11-05 NOTE — Progress Notes (Signed)
Orthopedic Tech Progress Note Patient Details:  Susan Nunez [birth year] [id number]  CPM Right Knee CPM Right Knee: On Right Knee Flexion (Degrees): 60 Right Knee Extension (Degrees): 0   Susan Nunez 11/05/2014, 6:36 AM

## 2014-11-06 DIAGNOSIS — K219 Gastro-esophageal reflux disease without esophagitis: Secondary | ICD-10-CM | POA: Diagnosis not present

## 2014-11-06 DIAGNOSIS — R2689 Other abnormalities of gait and mobility: Secondary | ICD-10-CM | POA: Diagnosis not present

## 2014-11-06 DIAGNOSIS — M1990 Unspecified osteoarthritis, unspecified site: Secondary | ICD-10-CM | POA: Diagnosis not present

## 2014-11-06 DIAGNOSIS — I10 Essential (primary) hypertension: Secondary | ICD-10-CM | POA: Diagnosis not present

## 2014-11-06 DIAGNOSIS — Z96651 Presence of right artificial knee joint: Secondary | ICD-10-CM | POA: Diagnosis not present

## 2014-11-06 DIAGNOSIS — M1711 Unilateral primary osteoarthritis, right knee: Secondary | ICD-10-CM | POA: Diagnosis not present

## 2014-11-06 DIAGNOSIS — M25569 Pain in unspecified knee: Secondary | ICD-10-CM | POA: Diagnosis not present

## 2014-11-06 DIAGNOSIS — M6281 Muscle weakness (generalized): Secondary | ICD-10-CM | POA: Diagnosis not present

## 2014-11-06 DIAGNOSIS — Z471 Aftercare following joint replacement surgery: Secondary | ICD-10-CM | POA: Diagnosis not present

## 2014-11-06 DIAGNOSIS — I341 Nonrheumatic mitral (valve) prolapse: Secondary | ICD-10-CM | POA: Diagnosis not present

## 2014-11-06 DIAGNOSIS — K5900 Constipation, unspecified: Secondary | ICD-10-CM | POA: Diagnosis not present

## 2014-11-06 DIAGNOSIS — M25551 Pain in right hip: Secondary | ICD-10-CM | POA: Diagnosis not present

## 2014-11-06 DIAGNOSIS — E785 Hyperlipidemia, unspecified: Secondary | ICD-10-CM | POA: Diagnosis not present

## 2014-11-06 DIAGNOSIS — I129 Hypertensive chronic kidney disease with stage 1 through stage 4 chronic kidney disease, or unspecified chronic kidney disease: Secondary | ICD-10-CM | POA: Diagnosis not present

## 2014-11-06 DIAGNOSIS — F419 Anxiety disorder, unspecified: Secondary | ICD-10-CM | POA: Diagnosis not present

## 2014-11-06 DIAGNOSIS — N183 Chronic kidney disease, stage 3 (moderate): Secondary | ICD-10-CM | POA: Diagnosis not present

## 2014-11-06 DIAGNOSIS — M25561 Pain in right knee: Secondary | ICD-10-CM | POA: Diagnosis not present

## 2014-11-06 DIAGNOSIS — E559 Vitamin D deficiency, unspecified: Secondary | ICD-10-CM | POA: Diagnosis not present

## 2014-11-06 MED ORDER — ASPIRIN EC 325 MG PO TBEC: 325.0000 mg | DELAYED_RELEASE_TABLET | Freq: Every day | ORAL | Status: DC

## 2014-11-06 MED ORDER — METHOCARBAMOL 500 MG PO TABS: 500.0000 mg | ORAL_TABLET | Freq: Three times a day (TID) | ORAL | Status: DC

## 2014-11-06 MED ORDER — OXYCODONE-ACETAMINOPHEN 5-325 MG PO TABS: 1.0000 | ORAL_TABLET | ORAL | Status: DC | PRN

## 2014-11-06 NOTE — Discharge Planning (Signed)
Patient to be discharged to [facility]. Patient updated regarding discharge.  Facility: [facility] RN report number: [phone] Transportation: EMS ([address])  Lubertha Sayres, [place] [phone]) and Surgical [phone])

## 2014-11-06 NOTE — Clinical Social Work Placement (Signed)
   CLINICAL SOCIAL WORK PLACEMENT  NOTE  Date:  11/06/2014  Patient Details  Name: Susan Nunez MRN: [id number] Date of Birth: 02-Mar-1941  Clinical Social Work is seeking post-discharge placement for this patient at the [place] level of care (*CSW will initial, date and re-position this form in  chart as items are completed):  Yes   Patient/family provided with [facility] Work Department's list of facilities offering this level of care within the geographic area requested by the patient (or if unable, by the patient's family).  Yes   Patient/family informed of their freedom to choose among providers that offer the needed level of care, that participate in Medicare, Medicaid or managed care program needed by the patient, have an available bed and are willing to accept the patient.  Yes   Patient/family informed of [facility]'s ownership interest in [facility] and [facility], as well as of the fact that they are under no obligation to receive care at these facilities.  PASRR submitted to EDS on 11/06/14     PASRR number received on 11/06/14     Existing PASRR number confirmed on  (n/a)     FL2 transmitted to all facilities in geographic area requested by pt/family on 11/06/14     FL2 transmitted to all facilities within larger geographic area on  (n/a)     Patient informed that his/her managed care company has contracts with or will negotiate with certain facilities, including the following:   (yes, [facility])     Yes   Patient/family informed of bed offers received.  Patient chooses bed at [facility] [place] recommends and patient chooses bed at  (n/a)    Patient to be transferred to [facility] [place] on 11/06/14.  Patient to be transferred to facility by [facility]     Patient family notified on 11/06/14 of transfer.  Name of family member notified:  Patient     PHYSICIAN       Additional Comment:     _______________________________________________ Caroline Sauger, LCSW 11/06/2014, 2:42 PM

## 2014-11-06 NOTE — Care Management Note (Signed)
Case Management Note  Patient Details  Name: Susan Nunez MRN: [id number] Date of Birth: 09/10/1940  Subjective/Objective:                 S/p right total knee arthroplasty   Action/Plan: PT recommended SNF, referral made to CSW, patient to discharge to [facility] today.  Expected Discharge Date:                  Expected Discharge Plan:  Skilled Nursing Facility  In-House Referral:  Clinical Social Work  Discharge planning Services     Post Acute Care Choice:  NA Choice offered to:     DME Arranged:    DME Agency:     HH Arranged:    [place] Agency:     Status of Service:  Completed, signed off  Medicare Important Message Given:    Date Medicare IM Given:    Medicare IM give by:    Date Additional Medicare IM Given:    Additional Medicare Important Message give by:     If discussed at [place] of Stay Meetings, dates discussed:    Additional Comments:  Nila Nephew, RN 11/06/2014, 10:08 AM

## 2014-11-06 NOTE — Progress Notes (Signed)
Physical Therapy Treatment Patient Details Name: Susan Nunez MRN: [id number] DOB: 05/02/1940 Today's Date: 11/06/2014    History of Present Illness Patient is a 74 y/o female s/p Rt TKA. PMH includes HTN, mitral valve prolapse, anxiety, CKD.     PT Comments    Patient progressing very slowly with mobility. Requires max encouragement for gait training. Pt self limiting ambulation distance due to pain. Instructed pt in exercises. Tolerated ambulating 10' with RW and mod A for balance/safety. Poor pain tolerance despite being premedicated. Appropriate for ST SNF. Will follow acutely.   Follow Up Recommendations  SNF;Supervision/Assistance - 24 hour     Equipment Recommendations  Rolling walker with 5" wheels    Recommendations for Other Services       Precautions / Restrictions Precautions Precautions: Knee;Fall Precaution Booklet Issued: No Precaution Comments: Reviewed no pillow under knee Restrictions Weight Bearing Restrictions: Yes RLE Weight Bearing: Weight bearing as tolerated    Mobility  Bed Mobility Overal bed mobility: Needs Assistance Bed Mobility: Supine to Sit     Supine to sit: Min assist     General bed mobility comments: Min A to bring RLE to EOB. Increased time and cues for sequencing. Use of rail for support.  Transfers Overall transfer level: Needs assistance Equipment used: Rolling walker (2 wheeled) Transfers: Sit to/from Stand Sit to Stand: Mod assist         General transfer comment: Mod A to boost from EOB with cues for hand placement/technique. Reluctant to place weight through RLE. Increased time.   Ambulation/Gait Ambulation/Gait assistance: Min assist Ambulation Distance (Feet): 10 Feet Assistive device: Rolling walker (2 wheeled) Gait Pattern/deviations: Step-to pattern;Decreased stance time - right;Decreased step length - left;Trunk flexed   Gait velocity interpretation: <1.8 ft/sec, indicative of risk for recurrent  falls General Gait Details: Reluctant to place weight through RLE during gait. Cues for knee extension during stance phase. Moaning in pain during WB. Mod A for balance. Self limiting distance.    Stairs            Wheelchair Mobility    Modified Rankin (Stroke Patients Only)       Balance Overall balance assessment: Needs assistance Sitting-balance support: Feet supported;Single extremity supported Sitting balance-Leahy Scale: Fair     Standing balance support: During functional activity Standing balance-Leahy Scale: Poor                      Cognition Arousal/Alertness: Awake/alert Behavior During Therapy: Anxious Overall Cognitive Status: Within Functional Limits for tasks assessed                      Exercises Total Joint Exercises Ankle Circles/Pumps: Both;10 reps;Seated Quad Sets: Both;10 reps;Seated (3-5 sec hold.) Hip ABduction/ADduction: Right;10 reps;Seated Goniometric ROM: 10-75 degrees knee AROM    General Comments        Pertinent Vitals/Pain Pain Assessment: Faces Faces Pain Scale: Hurts whole lot Pain Location: right knee with movement Pain Descriptors / Indicators: Sore;Aching Pain Intervention(s): Monitored during session;Premedicated before session;Repositioned;Limited activity within patient's tolerance    Home Living                      Prior Function            PT Goals (current goals can now be found in the care plan section) Progress towards PT goals: Progressing toward goals (very slowly)    Frequency  7X/week    PT Plan  Current plan remains appropriate    Co-evaluation             End of Session Equipment Utilized During Treatment: Gait belt Activity Tolerance: Patient limited by pain Patient left: in chair;with call bell/phone within reach     Time: 0905-0928 PT Time Calculation (min) (ACUTE ONLY): 23 min  Charges:  $Gait Training: 8-22 mins $Therapeutic Activity: 8-22 mins                     G Codes:      Susan Nunez A Mathius Birkeland 11/06/2014, 9:54 AM Wray Kearns, PT, DPT [phone]

## 2014-11-06 NOTE — Discharge Summary (Signed)
Physician Discharge Summary  Patient ID: Susan Nunez MRN: [id number] DOB/AGE: 1940/12/13 74 y.o.  Admit date: 11/04/2014 Discharge date: 11/06/2014  Admission Diagnoses: Osteoarthritis right knee  Discharge Diagnoses:  Active Problems:   Total knee replacement status   Discharged Condition: stable  Hospital Course: Patient's hospital course was essentially unremarkable. She is able to ambulate independently require skilled nursing for discharge and was discharged to skilled nursing in stable condition.  Consults: None  Significant Diagnostic Studies: labs: Routine labs  Treatments: surgery: See operative note  Discharge Exam: Blood pressure 143/70, pulse 117, temperature 98.8 F (37.1 C), temperature source Oral, resp. rate 18, weight 74.05 kg (163 lb 4 oz), SpO2 97 %. Incision/Wound: dressing clean and dry  Disposition: 01-Home or Self Care     Medication List    ASK your doctor about these medications        aspirin 81 MG tablet  Take 81 mg by mouth daily.     EYE VITAMINS PO  Take 1 tablet by mouth daily.     CENTRUM SILVER PO  Take 1 tablet by mouth daily.     Fish Oil 1200 MG Caps  Take 1,200 mg by mouth 2 (two) times a week.     lisinopril 5 MG tablet  Commonly known as:  PRINIVIL,ZESTRIL  Take 5 mg by mouth daily.     omeprazole 20 MG tablet  Commonly known as:  PRILOSEC OTC  Take 20 mg by mouth daily as needed (for heartburn).     propranolol 10 MG tablet  Commonly known as:  INDERAL  Take 1 tablet (10 mg total) by mouth 2 (two) times daily as needed.     VITAMIN B COMPLEX PO  Take 1 tablet by mouth daily.     Vitamin D3 2000 UNITS Tabs  Take 2,000 Units by mouth daily.           Follow-up Information    Follow up with DUDA,MARCUS V, MD In 1 week.   Specialty:  Orthopedic Surgery   Contact information:   [place] [address] [phone]       Signed: Newt Minion 11/06/2014, 6:14 AM

## 2014-11-06 NOTE — Evaluation (Signed)
Occupational Therapy Evaluation Patient Details Name: Susan Nunez MRN: [id number] DOB: 31-Oct-1940 Today's Date: 11/06/2014    History of Present Illness Patient is a 74 y/o female s/p Rt TKA. PMH includes HTN, mitral valve prolapse, anxiety, CKD.    Clinical Impression   Plan is for patient to discharge > SNF. No acute OT needs identified, all needs can be met in SNF. Please send text page to OT services if any questions, concerns, or with new orders: [phone] OR call office at [phone]. Thank you for the order.      Follow Up Recommendations  SNF;Supervision/Assistance - 24 hour    Equipment Recommendations  Other (comment) (TBD next venue of care)    Recommendations for Other Services  None at this time   Precautions / Restrictions Precautions Precautions: Knee;Fall Precaution Booklet Issued: No Precaution Comments: Reviewed no pillow under knee Restrictions Weight Bearing Restrictions: Yes RLE Weight Bearing: Weight bearing as tolerated    Mobility - Per PT note Bed Mobility Overal bed mobility: Needs Assistance Bed Mobility: Supine to Sit     Supine to sit: Min assist     General bed mobility comments: Min A to bring RLE to EOB. Increased time and cues for sequencing. Use of rail for support.  Transfers Overall transfer level: Needs assistance Equipment used: Rolling walker (2 wheeled) Transfers: Sit to/from Stand Sit to Stand: Mod assist         General transfer comment: Mod A to boost from EOB with cues for hand placement/technique. Reluctant to place weight through RLE. Increased time.     Balance - Per PT note Overall balance assessment: Needs assistance Sitting-balance support: Feet supported;Single extremity supported Sitting balance-Leahy Scale: Fair     Standing balance support: During functional activity Standing balance-Leahy Scale: Poor    ADL Overall ADL's : Needs assistance/impaired General ADL Comments: Pt set-up for UB  ADLs and mod>max assist with LB ADLs, pt unable to reach BLEs for LB ADLs.     Pertinent Vitals/Pain Pain Assessment: Faces Faces Pain Scale: Hurts whole lot Pain Location: right knee with minimal movement Pain Descriptors / Indicators: Grimacing Pain Intervention(s): Limited activity within patient's tolerance;Premedicated before session     Hand Dominance Right   Extremity/Trunk Assessment Upper Extremity Assessment Upper Extremity Assessment: Generalized weakness   Lower Extremity Assessment Lower Extremity Assessment: Defer to PT evaluation   Cervical / Trunk Assessment Cervical / Trunk Assessment: Normal   Communication Communication Communication: No difficulties   Cognition Arousal/Alertness: Awake/alert Behavior During Therapy: Anxious Overall Cognitive Status: Within Functional Limits for tasks assessed             Home Living Family/patient expects to be discharged to:: Skilled nursing facility Living Arrangements: Alone Available Help at Discharge: Family;Available PRN/intermittently Type of Home: House Home Access: Stairs to enter [employer] of Steps: 4 - front, 1- back Entrance Stairs-Rails: None Home Layout: One level     Bathroom Shower/Tub: Occupational psychologist: Handicapped height     Home Equipment: Shower seat - built in   Prior Functioning/Environment Level of Independence: Independent  Comments: Pt reports that she was independent with all mobility and ADLs PTA, pt reports she drove and mowed her lawn PTA.     OT Diagnosis: Generalized weakness;Acute pain   OT Problem List:  n/a, all needs can be met in next venue of care    OT Treatment/Interventions:   n/a, all needs can be met in next venue of  care    OT Goals(Current goals can be found in the care plan section) Acute Rehab OT Goals Patient Stated Goal: to go to rehab OT Goal Formulation:  (all needs can be met in next venue of care)  OT Frequency:   n/a,  all needs can be met in next venue of care   Barriers to D/C:  decreased caregiver support    End of Session CPM Right Knee CPM Right Knee: Off  Activity Tolerance: Patient limited by pain Patient left: in chair;with call bell/phone within reach   Time: 0950-0958 OT Time Calculation (min): 8 min Charges:  OT General Charges $OT Visit: 1 Procedure OT Evaluation $Initial OT Evaluation Tier I: 1 Procedure  Jesenya Bowditch , MS, OTR/L, CLT Pager: [phone]  11/06/2014, 10:12 AM

## 2014-11-09 DIAGNOSIS — M25551 Pain in right hip: Secondary | ICD-10-CM | POA: Diagnosis not present

## 2014-11-09 DIAGNOSIS — K5900 Constipation, unspecified: Secondary | ICD-10-CM | POA: Diagnosis not present

## 2014-11-11 DIAGNOSIS — E559 Vitamin D deficiency, unspecified: Secondary | ICD-10-CM | POA: Diagnosis not present

## 2014-11-11 DIAGNOSIS — K219 Gastro-esophageal reflux disease without esophagitis: Secondary | ICD-10-CM | POA: Diagnosis not present

## 2014-11-11 DIAGNOSIS — I10 Essential (primary) hypertension: Secondary | ICD-10-CM | POA: Diagnosis not present

## 2014-11-11 DIAGNOSIS — R2689 Other abnormalities of gait and mobility: Secondary | ICD-10-CM | POA: Diagnosis not present

## 2014-11-11 DIAGNOSIS — M25569 Pain in unspecified knee: Secondary | ICD-10-CM | POA: Diagnosis not present

## 2014-11-11 DIAGNOSIS — M25561 Pain in right knee: Secondary | ICD-10-CM | POA: Diagnosis not present

## 2014-11-13 DIAGNOSIS — M25561 Pain in right knee: Secondary | ICD-10-CM | POA: Diagnosis not present

## 2014-11-13 DIAGNOSIS — M1711 Unilateral primary osteoarthritis, right knee: Secondary | ICD-10-CM | POA: Diagnosis not present

## 2014-11-13 DIAGNOSIS — R2689 Other abnormalities of gait and mobility: Secondary | ICD-10-CM | POA: Diagnosis not present

## 2014-11-16 DIAGNOSIS — K5900 Constipation, unspecified: Secondary | ICD-10-CM | POA: Diagnosis not present

## 2014-11-16 DIAGNOSIS — M6281 Muscle weakness (generalized): Secondary | ICD-10-CM | POA: Diagnosis not present

## 2014-11-16 DIAGNOSIS — M25561 Pain in right knee: Secondary | ICD-10-CM | POA: Diagnosis not present

## 2014-11-19 DIAGNOSIS — I129 Hypertensive chronic kidney disease with stage 1 through stage 4 chronic kidney disease, or unspecified chronic kidney disease: Secondary | ICD-10-CM | POA: Diagnosis not present

## 2014-11-19 DIAGNOSIS — M1990 Unspecified osteoarthritis, unspecified site: Secondary | ICD-10-CM | POA: Diagnosis not present

## 2014-11-19 DIAGNOSIS — Z471 Aftercare following joint replacement surgery: Secondary | ICD-10-CM | POA: Diagnosis not present

## 2014-11-19 DIAGNOSIS — M6281 Muscle weakness (generalized): Secondary | ICD-10-CM | POA: Diagnosis not present

## 2014-11-19 DIAGNOSIS — F419 Anxiety disorder, unspecified: Secondary | ICD-10-CM | POA: Diagnosis not present

## 2014-11-19 DIAGNOSIS — N183 Chronic kidney disease, stage 3 (moderate): Secondary | ICD-10-CM | POA: Diagnosis not present

## 2014-11-23 DIAGNOSIS — M6281 Muscle weakness (generalized): Secondary | ICD-10-CM | POA: Diagnosis not present

## 2014-11-23 DIAGNOSIS — F419 Anxiety disorder, unspecified: Secondary | ICD-10-CM | POA: Diagnosis not present

## 2014-11-23 DIAGNOSIS — N183 Chronic kidney disease, stage 3 (moderate): Secondary | ICD-10-CM | POA: Diagnosis not present

## 2014-11-23 DIAGNOSIS — I129 Hypertensive chronic kidney disease with stage 1 through stage 4 chronic kidney disease, or unspecified chronic kidney disease: Secondary | ICD-10-CM | POA: Diagnosis not present

## 2014-11-23 DIAGNOSIS — Z471 Aftercare following joint replacement surgery: Secondary | ICD-10-CM | POA: Diagnosis not present

## 2014-11-23 DIAGNOSIS — M1990 Unspecified osteoarthritis, unspecified site: Secondary | ICD-10-CM | POA: Diagnosis not present

## 2014-11-24 DIAGNOSIS — Z471 Aftercare following joint replacement surgery: Secondary | ICD-10-CM | POA: Diagnosis not present

## 2014-11-24 DIAGNOSIS — F419 Anxiety disorder, unspecified: Secondary | ICD-10-CM | POA: Diagnosis not present

## 2014-11-24 DIAGNOSIS — I129 Hypertensive chronic kidney disease with stage 1 through stage 4 chronic kidney disease, or unspecified chronic kidney disease: Secondary | ICD-10-CM | POA: Diagnosis not present

## 2014-11-24 DIAGNOSIS — N183 Chronic kidney disease, stage 3 (moderate): Secondary | ICD-10-CM | POA: Diagnosis not present

## 2014-11-24 DIAGNOSIS — M1990 Unspecified osteoarthritis, unspecified site: Secondary | ICD-10-CM | POA: Diagnosis not present

## 2014-11-24 DIAGNOSIS — M6281 Muscle weakness (generalized): Secondary | ICD-10-CM | POA: Diagnosis not present

## 2014-11-26 DIAGNOSIS — F419 Anxiety disorder, unspecified: Secondary | ICD-10-CM | POA: Diagnosis not present

## 2014-11-26 DIAGNOSIS — N183 Chronic kidney disease, stage 3 (moderate): Secondary | ICD-10-CM | POA: Diagnosis not present

## 2014-11-26 DIAGNOSIS — I129 Hypertensive chronic kidney disease with stage 1 through stage 4 chronic kidney disease, or unspecified chronic kidney disease: Secondary | ICD-10-CM | POA: Diagnosis not present

## 2014-11-26 DIAGNOSIS — M1990 Unspecified osteoarthritis, unspecified site: Secondary | ICD-10-CM | POA: Diagnosis not present

## 2014-11-26 DIAGNOSIS — Z471 Aftercare following joint replacement surgery: Secondary | ICD-10-CM | POA: Diagnosis not present

## 2014-11-26 DIAGNOSIS — M6281 Muscle weakness (generalized): Secondary | ICD-10-CM | POA: Diagnosis not present

## 2014-12-01 DIAGNOSIS — I129 Hypertensive chronic kidney disease with stage 1 through stage 4 chronic kidney disease, or unspecified chronic kidney disease: Secondary | ICD-10-CM | POA: Diagnosis not present

## 2014-12-01 DIAGNOSIS — N183 Chronic kidney disease, stage 3 (moderate): Secondary | ICD-10-CM | POA: Diagnosis not present

## 2014-12-01 DIAGNOSIS — M6281 Muscle weakness (generalized): Secondary | ICD-10-CM | POA: Diagnosis not present

## 2014-12-01 DIAGNOSIS — Z471 Aftercare following joint replacement surgery: Secondary | ICD-10-CM | POA: Diagnosis not present

## 2014-12-01 DIAGNOSIS — M1990 Unspecified osteoarthritis, unspecified site: Secondary | ICD-10-CM | POA: Diagnosis not present

## 2014-12-01 DIAGNOSIS — F419 Anxiety disorder, unspecified: Secondary | ICD-10-CM | POA: Diagnosis not present

## 2014-12-03 DIAGNOSIS — N183 Chronic kidney disease, stage 3 (moderate): Secondary | ICD-10-CM | POA: Diagnosis not present

## 2014-12-03 DIAGNOSIS — F419 Anxiety disorder, unspecified: Secondary | ICD-10-CM | POA: Diagnosis not present

## 2014-12-03 DIAGNOSIS — Z471 Aftercare following joint replacement surgery: Secondary | ICD-10-CM | POA: Diagnosis not present

## 2014-12-03 DIAGNOSIS — M1990 Unspecified osteoarthritis, unspecified site: Secondary | ICD-10-CM | POA: Diagnosis not present

## 2014-12-03 DIAGNOSIS — M6281 Muscle weakness (generalized): Secondary | ICD-10-CM | POA: Diagnosis not present

## 2014-12-03 DIAGNOSIS — I129 Hypertensive chronic kidney disease with stage 1 through stage 4 chronic kidney disease, or unspecified chronic kidney disease: Secondary | ICD-10-CM | POA: Diagnosis not present

## 2014-12-07 DIAGNOSIS — R252 Cramp and spasm: Secondary | ICD-10-CM | POA: Diagnosis not present

## 2014-12-07 DIAGNOSIS — Z96651 Presence of right artificial knee joint: Secondary | ICD-10-CM | POA: Diagnosis not present

## 2014-12-08 DIAGNOSIS — Z471 Aftercare following joint replacement surgery: Secondary | ICD-10-CM | POA: Diagnosis not present

## 2014-12-08 DIAGNOSIS — M6281 Muscle weakness (generalized): Secondary | ICD-10-CM | POA: Diagnosis not present

## 2014-12-08 DIAGNOSIS — N183 Chronic kidney disease, stage 3 (moderate): Secondary | ICD-10-CM | POA: Diagnosis not present

## 2014-12-08 DIAGNOSIS — M1990 Unspecified osteoarthritis, unspecified site: Secondary | ICD-10-CM | POA: Diagnosis not present

## 2014-12-08 DIAGNOSIS — F419 Anxiety disorder, unspecified: Secondary | ICD-10-CM | POA: Diagnosis not present

## 2014-12-08 DIAGNOSIS — I129 Hypertensive chronic kidney disease with stage 1 through stage 4 chronic kidney disease, or unspecified chronic kidney disease: Secondary | ICD-10-CM | POA: Diagnosis not present

## 2014-12-11 DIAGNOSIS — Z471 Aftercare following joint replacement surgery: Secondary | ICD-10-CM | POA: Diagnosis not present

## 2014-12-11 DIAGNOSIS — I129 Hypertensive chronic kidney disease with stage 1 through stage 4 chronic kidney disease, or unspecified chronic kidney disease: Secondary | ICD-10-CM | POA: Diagnosis not present

## 2014-12-11 DIAGNOSIS — M1990 Unspecified osteoarthritis, unspecified site: Secondary | ICD-10-CM | POA: Diagnosis not present

## 2014-12-11 DIAGNOSIS — N183 Chronic kidney disease, stage 3 (moderate): Secondary | ICD-10-CM | POA: Diagnosis not present

## 2014-12-11 DIAGNOSIS — M6281 Muscle weakness (generalized): Secondary | ICD-10-CM | POA: Diagnosis not present

## 2014-12-11 DIAGNOSIS — F419 Anxiety disorder, unspecified: Secondary | ICD-10-CM | POA: Diagnosis not present

## 2014-12-16 ENCOUNTER — Ambulatory Visit: Payer: Medicare PPO | Attending: Orthopedic Surgery | Admitting: Physical Therapy

## 2014-12-16 DIAGNOSIS — M25561 Pain in right knee: Secondary | ICD-10-CM | POA: Insufficient documentation

## 2014-12-16 DIAGNOSIS — M25661 Stiffness of right knee, not elsewhere classified: Secondary | ICD-10-CM | POA: Insufficient documentation

## 2014-12-16 NOTE — Therapy (Signed)
[facility] [place], [place], [address] Phone: [phone]   Fax:  [phone]  Physical Therapy Evaluation  Patient Details  Name: Susan Nunez MRN: [id number] Date of Birth: [birth year] Referring Provider:  Newt Minion, MD  Encounter Date: 12/16/2014      PT End of Session - 12/16/14 1452    Visit Number 1   Number of Visits 12   Date for PT Re-Evaluation 02/03/15   PT Start Time 0232   PT Stop Time 0316   PT Time Calculation (min) 44 min   Equipment Utilized During Treatment --  Straight cane.   Activity Tolerance Patient tolerated treatment well   Behavior During Therapy [facility] for tasks assessed/performed      Past Medical History  Diagnosis Date  . Hyperlipidemia   . Hypertension   . Mitral valve prolapse     with mitral regurgitation   . Complication of anesthesia     difficulty waking up, difficulty peeing, extreme nausea & vomiting  . PONV (postoperative nausea and vomiting)   . Anxiety     when husband was ill and died  . Chronic kidney disease     stage 3....renal insufficiency  . GERD (gastroesophageal reflux disease)     not on daily basis  . Arthritis   . Diverticulosis     Past Surgical History  Procedure Laterality Date  . Cardiac catheterization  02/21/2010    Est. EF at 65% -- mooth and normal coronary arteries -- Normal left ventricular systolic function.  We will continue with medical therapy.  I suspect her chest pain was due to her mitral valve prolapse syndrome --  Thayer Headings, M.D.   . Total vaginal hysterectomy  12/18/2001  . Foot surgery  07/12/00    right  . Knee arthroscopy  06/14/2009  . Lumbar spine surgery      lumbar spine fusion L5-S1 performed 20 years ago x 3  . Back surgery      x 3  . Abdominal hysterectomy    . Joint replacement    . Total knee arthroplasty Right 11/04/2014    Procedure: RIGHT TOTAL KNEE ARTHROPLASTY;  Surgeon: Newt Minion, MD;   Location: [place];  Service: Orthopedics;  Laterality: Right;    There were no vitals filed for this visit.  Visit Diagnosis:  Right knee pain - Plan: PT plan of care cert/re-cert  Knee stiffness, right - Plan: PT plan of care cert/re-cert      Subjective Assessment - 12/16/14 1436    Subjective Muscle spasms waking me at night.   Patient Stated Goals Get out of pain.   Currently in Pain? Yes   Pain Score 1   1/10 at rest.   Pain Location Knee   Pain Orientation Right   Pain Descriptors / Indicators Sharp   Pain Type Surgical pain   Pain Onset More than a month ago   Pain Frequency Intermittent   Aggravating Factors  At night the patient gets intense muscle spams around her right knee.            [facility] PT Assessment - 12/16/14 0001    Assessment   Medical Diagnosis Right total knee arthroplasty.   Onset Date/Surgical Date --  11/04/14 (surgery date).   Next MD Visit --  11/31/6.   Precautions   Precaution Comments No ultrasound.   Restrictions   Weight Bearing Restrictions No   Balance Screen   Has the  patient fallen in the past 6 months No   Has the patient had a decrease in activity level because of a fear of falling?  No   Is the patient reluctant to leave their home because of a fear of falling?  No   Home Environment   Living Environment Private residence   Prior Function   Level of Independence Independent   Observation/Other Assessments-Edema    Edema Circumferential   Circumferential Edema   Circumferential - Right RT 1.5 cm > left.   ROM / Strength   AROM / PROM / Strength AROM;Strength   AROM   Overall AROM Comments Right knee AROM -4 degrees to 100 degrees (quite painful at end range flexion.)   Strength   Overall Strength Comments Right hip and knee strength= 4/5.   Palpation   Palpation comment Tender to palpation over right ITB bursal region.  Muscle spasm (not prsent at time of evaluation) described over area of right distal hamstrings and  tibialis anterior.   Ambulation/Gait   Gait Comments The patient is ambulating safely with a straighht cane.                   [facility] Adult PT Treatment/Exercise - 12/16/14 0001    Modalities   Modalities Electrical Stimulation;Vasopneumatic   Electrical Stimulation   Electrical Stimulation Location Right knee.   Electrical Stimulation Action 80-150 HZ at 100% scan   Electrical Stimulation Goals Edema;Pain   Vasopneumatic   Number Minutes Vasopneumatic  15 minutes   Vasopnuematic Location  --  Right knee.   Vasopneumatic Pressure Medium                  PT Short Term Goals - 12/16/14 1529    PT SHORT TERM GOAL #1   Title Ind with HEP.   Time 3   Period Weeks   Status New   PT SHORT TERM GOAL #2   Title Achieve full active left knee extension.   Time 3   Period Weeks   Status New           PT Long Term Goals - 12/16/14 1529    PT LONG TERM GOAL #1   Title Active right knee flexion to 120 degrees+ so the patient can perform functional tasks and do so with pain not > 2-3/10.   Time 6   Period Weeks   Status New   PT LONG TERM GOAL #2   Title Increase right knee strength to a solid 5/5 to provide good stability for accomplishment of functional activities   Time 6   Period Weeks   Status New   PT LONG TERM GOAL #3   Title Perform a reciprocating stair gait with one railing with pain not > 2-3/10.   Time 6   Period Weeks   Status New   PT LONG TERM GOAL #4   Title Perform ADL's with pain not > 3/10.               Plan - 12/16/14 1516    Clinical Impression Statement The patient underwent a right total knee arthroplasty on 11/04/14.  Following her discharge from the hospital she had a SNF stay and then had [facility].  She is compliant to her HEP.  Her resting pain-level is a 1/10 but does have muscle spasm at night that "nearly pull me out of the bed.  She states she wants to go up and down stairs better.   Pt  will benefit from skilled  therapeutic intervention in order to improve on the following deficits Pain;Decreased activity tolerance;Decreased range of motion   Rehab Potential Excellent   PT Frequency 2x / week   PT Duration 6 weeks   PT Treatment/Interventions ADLs/Self Care Home Management;Electrical Stimulation;Moist Heat;Therapeutic exercise;Therapeutic activities;Stair training;Neuromuscular re-education;Patient/family education;Manual techniques;Passive range of motion   PT Next Visit Plan Nustep; Right hamstring, ITB and tib ant STW; right total knee arthroplasty protocol.  No ultrasound.          G-Codes - 2014-12-25 1531    Functional Assessment Tool Used FOTO.   Functional Limitation Mobility: Walking and moving around   Mobility: Walking and Moving Around Current Status [phone]) At least 40 percent but less than 60 percent impaired, limited or restricted   Mobility: Walking and Moving Around Goal Status [phone]) At least 1 percent but less than 20 percent impaired, limited or restricted       Problem List Patient Active Problem List   Diagnosis Date Noted  . 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 Chest pain 10/28/2010  . Hyperlipidemia 10/28/2010  . Mitral valve prolapse 10/28/2010    Laasya Peyton, [name] 12/25/2014, 3:33 PM  [facility] [address] [place], [place], [address] Phone: [phone]   Fax:  [phone]

## 2014-12-17 ENCOUNTER — Ambulatory Visit: Payer: Medicare PPO | Admitting: Physical Therapy

## 2014-12-17 DIAGNOSIS — M25561 Pain in right knee: Secondary | ICD-10-CM | POA: Diagnosis not present

## 2014-12-17 DIAGNOSIS — M25661 Stiffness of right knee, not elsewhere classified: Secondary | ICD-10-CM

## 2014-12-17 NOTE — Therapy (Signed)
[facility] [place], [place], [address] Phone: [phone]   Fax:  [phone]  Physical Therapy Treatment  Patient Details  Name: Susan Nunez MRN: [id number] Date of Birth: 1941-01-09 Referring Provider:  Cari Caraway, MD  Encounter Date: 12/17/2014      PT End of Session - 12/17/14 1416    Visit Number 2   Number of Visits 12   Date for PT Re-Evaluation 02/03/15   PT Start Time 7106   PT Stop Time 1349   PT Time Calculation (min) 51 min   Activity Tolerance Patient tolerated treatment well   Behavior During Therapy [facility] for tasks assessed/performed      Past Medical History  Diagnosis Date  . Hyperlipidemia   . Hypertension   . Mitral valve prolapse     with mitral regurgitation   . Complication of anesthesia     difficulty waking up, difficulty peeing, extreme nausea & vomiting  . PONV (postoperative nausea and vomiting)   . Anxiety     when husband was ill and died  . Chronic kidney disease     stage 3....renal insufficiency  . GERD (gastroesophageal reflux disease)     not on daily basis  . Arthritis   . Diverticulosis     Past Surgical History  Procedure Laterality Date  . Cardiac catheterization  02/21/2010    Est. EF at 65% -- mooth and normal coronary arteries -- Normal left ventricular systolic function.  We will continue with medical therapy.  I suspect her chest pain was due to her mitral valve prolapse syndrome --  Thayer Headings, M.D.   . Total vaginal hysterectomy  12/18/2001  . Foot surgery  07/12/00    right  . Knee arthroscopy  06/14/2009  . Lumbar spine surgery      lumbar spine fusion L5-S1 performed 20 years ago x 3  . Back surgery      x 3  . Abdominal hysterectomy    . Joint replacement    . Total knee arthroplasty Right 11/04/2014    Procedure: RIGHT TOTAL KNEE ARTHROPLASTY;  Surgeon: Newt Minion, MD;  Location: [place];  Service: Orthopedics;  Laterality: Right;     There were no vitals filed for this visit.  Visit Diagnosis:  Right knee pain  Knee stiffness, right      Subjective Assessment - 12/17/14 1409    Subjective the muscle spams are bad at night.  When I saw Dr. he said it was normal.   Patient Stated Goals Get out of pain.   Pain Score 2    Pain Location Knee   Pain Orientation Right   Pain Type Surgical pain   Pain Onset More than a month ago                         [facility] Adult PT Treatment/Exercise - 12/17/14 0001    Exercises   Exercises Knee/Hip;Ankle   Knee/Hip Exercises: Aerobic   Nustep Level 3 x 15 minutes (moving forward x 1 to increase flexion.   Knee/Hip Exercises: Standing   Rocker Board --  5 minutes in parallel bars.   Modalities   Modalities Education officer, environmental Stimulation Location Right knee.   Electrical Stimulation Action 80-150 HZ.x 15 minutes.   Electrical Stimulation Goals Edema   Vasopneumatic   Number Minutes Vasopneumatic  15 minutes   Manual Therapy  Manual therapy comments STW/M x 10 minutes.                  PT Short Term Goals - 12-22-14 1529    PT SHORT TERM GOAL #1   Title Ind with HEP.   Time 3   Period Weeks   Status New   PT SHORT TERM GOAL #2   Title Achieve full active left knee extension.   Time 3   Period Weeks   Status New           PT Long Term Goals - 12/22/2014 1529    PT LONG TERM GOAL #1   Title Active right knee flexion to 120 degrees+ so the patient can perform functional tasks and do so with pain not > 2-3/10.   Time 6   Period Weeks   Status New   PT LONG TERM GOAL #2   Title Increase right knee strength to a solid 5/5 to provide good stability for accomplishment of functional activities   Time 6   Period Weeks   Status New   PT LONG TERM GOAL #3   Title Perform a reciprocating stair gait with one railing with pain not > 2-3/10.   Time 6   Period Weeks   Status New   PT LONG TERM  GOAL #4   Title Perform ADL's with pain not > 3/10.               Plan - 12-22-14 1516    Clinical Impression Statement The patient underwent a right total knee arthroplasty on 11/04/14.  Following her discharge from the hospital she had a SNF stay and then had [facility].  She is compliant to her HEP.  Her resting pain-level is a 1/10 but does have muscle spasm at night that "nearly pull me out of the bed.  She states she wants to go up and down stairs better.   Pt will benefit from skilled therapeutic intervention in order to improve on the following deficits Pain;Decreased activity tolerance;Decreased range of motion   Rehab Potential Excellent   PT Frequency 2x / week   PT Duration 6 weeks   PT Treatment/Interventions ADLs/Self Care Home Management;Electrical Stimulation;Moist Heat;Therapeutic exercise;Therapeutic activities;Stair training;Neuromuscular re-education;Patient/family education;Manual techniques;Passive range of motion   PT Next Visit Plan Nustep; Right hamstring, ITB and tib ant STW; right total knee arthroplasty protocol.  No ultrasound.          G-Codes - 2014/12/22 1531    Functional Assessment Tool Used FOTO.   Functional Limitation Mobility: Walking and moving around   Mobility: Walking and Moving Around Current Status [phone]) At least 40 percent but less than 60 percent impaired, limited or restricted   Mobility: Walking and Moving Around Goal Status [phone]) At least 1 percent but less than 20 percent impaired, limited or restricted      Problem List Patient Active Problem List   Diagnosis Date Noted  . 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 Chest pain 10/28/2010  . Hyperlipidemia 10/28/2010  . Mitral valve prolapse 10/28/2010    APPLEGATE, [name] 12/17/2014, 2:25 PM  [facility] [address] [place], [place], [address] Phone: [phone]   Fax:  [phone]

## 2014-12-22 ENCOUNTER — Ambulatory Visit: Payer: Medicare PPO | Admitting: *Deleted

## 2014-12-22 ENCOUNTER — Encounter: Payer: Self-pay | Admitting: *Deleted

## 2014-12-22 DIAGNOSIS — M25561 Pain in right knee: Secondary | ICD-10-CM | POA: Diagnosis not present

## 2014-12-22 DIAGNOSIS — M25661 Stiffness of right knee, not elsewhere classified: Secondary | ICD-10-CM

## 2014-12-22 NOTE — Therapy (Signed)
[facility] [place], [place], [address] Phone: [phone]   Fax:  [phone]  Physical Therapy Treatment  Patient Details  Name: Susan Nunez MRN: [id number] Date of Birth: 1940/10/22 Referring Provider:  Cari Caraway, MD  Encounter Date: 12/22/2014      PT End of Session - 12/22/14 0826    Visit Number 3   Number of Visits 12   Date for PT Re-Evaluation 02/03/15   PT Start Time 0815   PT Stop Time 0914   PT Time Calculation (min) 59 min      Past Medical History  Diagnosis Date  . Hyperlipidemia   . Hypertension   . Mitral valve prolapse     with mitral regurgitation   . Complication of anesthesia     difficulty waking up, difficulty peeing, extreme nausea & vomiting  . PONV (postoperative nausea and vomiting)   . Anxiety     when husband was ill and died  . Chronic kidney disease     stage 3....renal insufficiency  . GERD (gastroesophageal reflux disease)     not on daily basis  . Arthritis   . Diverticulosis     Past Surgical History  Procedure Laterality Date  . Cardiac catheterization  02/21/2010    Est. EF at 65% -- mooth and normal coronary arteries -- Normal left ventricular systolic function.  We will continue with medical therapy.  I suspect her chest pain was due to her mitral valve prolapse syndrome --  Thayer Headings, M.D.   . Total vaginal hysterectomy  12/18/2001  . Foot surgery  07/12/00    right  . Knee arthroscopy  06/14/2009  . Lumbar spine surgery      lumbar spine fusion L5-S1 performed 20 years ago x 3  . Back surgery      x 3  . Abdominal hysterectomy    . Joint replacement    . Total knee arthroplasty Right 11/04/2014    Procedure: RIGHT TOTAL KNEE ARTHROPLASTY;  Surgeon: Newt Minion, MD;  Location: [facility];  Service: Orthopedics;  Laterality: Right;    There were no vitals filed for this visit.  Visit Diagnosis:  Right knee pain  Knee stiffness, right       Subjective Assessment - 12/22/14 0822    Subjective the muscle spams are bad at night.  When I saw Dr. he said it was normal.    Patient Stated Goals Get out of pain.   Currently in Pain? Yes   Pain Score 3    Pain Location Knee   Pain Orientation Right   Pain Descriptors / Indicators Sharp   Pain Type Surgical pain   Pain Onset More than a month ago   Pain Frequency Intermittent   Aggravating Factors  Muscle spasms at night                         [facility] Adult PT Treatment/Exercise - 12/22/14 0001    Exercises   Exercises Knee/Hip;Ankle   Knee/Hip Exercises: Aerobic   Nustep Level 3 x 15 minutes (moving forward x 1 to increase flexion.   Knee/Hip Exercises: Standing   Knee Flexion AROM;Right;2 sets;10 reps  14 inch box lunges for flexion   Rocker Board 3 minutes  calf stretching   Modalities   Modalities Electrical Stimulation   Electrical Stimulation   Electrical Stimulation Location Right knee. Premod 80-115hz  x 15 mins   Electrical Stimulation  Goals Edema   Vasopneumatic   Number Minutes Vasopneumatic  15 minutes   Vasopnuematic Location  Knee   Vasopneumatic Pressure Medium   Vasopneumatic Temperature  36   Manual Therapy   Manual Therapy Soft tissue mobilization;Myofascial release;Passive ROM   Soft tissue mobilization STW to scar and patella mobs . STW to ITB and lposterior aspect   Passive ROM PROM for RT knee ext and flexion                  PT Short Term Goals - 12/16/14 1529    PT SHORT TERM GOAL #1   Title Ind with HEP.   Time 3   Period Weeks   Status New   PT SHORT TERM GOAL #2   Title Achieve full active left knee extension.   Time 3   Period Weeks   Status New           PT Long Term Goals - 12/16/14 1529    PT LONG TERM GOAL #1   Title Active right knee flexion to 120 degrees+ so the patient can perform functional tasks and do so with pain not > 2-3/10.   Time 6   Period Weeks   Status New   PT LONG TERM GOAL  #2   Title Increase right knee strength to a solid 5/5 to provide good stability for accomplishment of functional activities   Time 6   Period Weeks   Status New   PT LONG TERM GOAL #3   Title Perform a reciprocating stair gait with one railing with pain not > 2-3/10.   Time 6   Period Weeks   Status New   PT LONG TERM GOAL #4   Title Perform ADL's with pain not > 3/10.               Plan - 12/22/14 0917    Clinical Impression Statement Pt did fairly well today with Rx and was able to reach 109 degrees of flexioin with PROM. She is still sore and tender Lateral aspect and posterior.   Pt will benefit from skilled therapeutic intervention in order to improve on the following deficits Pain;Decreased activity tolerance;Decreased range of motion   Rehab Potential Excellent   PT Frequency 2x / week   PT Duration 6 weeks   PT Treatment/Interventions ADLs/Self Care Home Management;Electrical Stimulation;Moist Heat;Therapeutic exercise;Therapeutic activities;Stair training;Neuromuscular re-education;Patient/family education;Manual techniques;Passive range of motion   PT Next Visit Plan Nustep; Right hamstring, ITB and tib ant STW; right total knee arthroplasty protocol.  No ultrasound.   Consulted and Agree with Plan of Care Patient        Problem List Patient Active Problem List   Diagnosis Date Noted  . 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 Chest pain 10/28/2010  . Hyperlipidemia 10/28/2010  . Mitral valve prolapse 10/28/2010    RAMSEUR,CHRISPTA 12/22/2014, 10:04 AM  [facility] [address] [place], [place], [address] Phone: [phone]   Fax:  [phone]

## 2014-12-24 ENCOUNTER — Ambulatory Visit: Payer: Medicare PPO | Admitting: Physical Therapy

## 2014-12-24 ENCOUNTER — Encounter: Payer: Self-pay | Admitting: Physical Therapy

## 2014-12-24 DIAGNOSIS — M25661 Stiffness of right knee, not elsewhere classified: Secondary | ICD-10-CM

## 2014-12-24 DIAGNOSIS — M25561 Pain in right knee: Secondary | ICD-10-CM | POA: Diagnosis not present

## 2014-12-24 NOTE — Therapy (Signed)
[facility] [place], [place], [address] Phone: [phone]   Fax:  [phone]  Physical Therapy Treatment  Patient Details  Name: Susan Nunez MRN: [id number] Date of Birth: [birth year] Referring Provider:  Cari Caraway, MD  Encounter Date: 12/24/2014      PT End of Session - 12/24/14 1305    Visit Number 4   Number of Visits 12   Date for PT Re-Evaluation 02/03/15   PT Start Time 1304   PT Stop Time 1357   PT Time Calculation (min) 53 min   Activity Tolerance Patient tolerated treatment well;Patient limited by pain   Behavior During Therapy [facility] for tasks assessed/performed      Past Medical History  Diagnosis Date  . Hyperlipidemia   . Hypertension   . Mitral valve prolapse     with mitral regurgitation   . Complication of anesthesia     difficulty waking up, difficulty peeing, extreme nausea & vomiting  . PONV (postoperative nausea and vomiting)   . Anxiety     when husband was ill and died  . Chronic kidney disease     stage 3....renal insufficiency  . GERD (gastroesophageal reflux disease)     not on daily basis  . Arthritis   . Diverticulosis     Past Surgical History  Procedure Laterality Date  . Cardiac catheterization  02/21/2010    Est. EF at 65% -- mooth and normal coronary arteries -- Normal left ventricular systolic function.  We will continue with medical therapy.  I suspect her chest pain was due to her mitral valve prolapse syndrome --  Thayer Headings, M.D.   . Total vaginal hysterectomy  12/18/2001  . Foot surgery  07/12/00    right  . Knee arthroscopy  06/14/2009  . Lumbar spine surgery      lumbar spine fusion L5-S1 performed 20 years ago x 3  . Back surgery      x 3  . Abdominal hysterectomy    . Joint replacement    . Total knee arthroplasty Right 11/04/2014    Procedure: RIGHT TOTAL KNEE ARTHROPLASTY;  Surgeon: Newt Minion, MD;  Location: [facility];  Service: Orthopedics;   Laterality: Right;    There were no vitals filed for this visit.  Visit Diagnosis:  Right knee pain  Knee stiffness, right      Subjective Assessment - 12/24/14 1304    Subjective Continues to report lateral R knee cramping and also has soreness in the posterior R knee. Reports no pain at rest with no movement. Reports not sleeping well secondary to night time cramping of RLE.   How long can you sit comfortably? 20 minutes   How long can you walk comfortably? No limit "just no energy"   Patient Stated Goals Get out of pain.   Currently in Pain? Yes   Pain Score 1    Pain Location Knee   Pain Orientation Right   Pain Descriptors / Indicators Sore;Other (Comment)  Pulling   Pain Type Surgical pain   Pain Onset More than a month ago   Pain Frequency Intermittent            [facility] PT Assessment - 12/24/14 0001    Assessment   Medical Diagnosis Right total knee arthroplasty.   Onset Date/Surgical Date 11/04/14   Next MD Visit 11/31/2016   Precautions   Precaution Comments No ultrasound.   ROM / Strength   AROM / PROM /  Strength AROM   AROM   Overall AROM  Deficits   AROM Assessment Site Knee   Right/Left Knee Right   Right Knee Extension -2   Right Knee Flexion 104                     [facility] Adult PT Treatment/Exercise - 12/24/14 0001    Knee/Hip Exercises: Aerobic   Nustep L5,4 x12 min, seat 13   Knee/Hip Exercises: Standing   Knee Flexion AROM;Right;2 sets;10 reps  3 sec hold on 14" step; L knee flexion ROM 104, 107 deg   Forward Step Up Right;2 sets;10 reps;Hand Hold: 2;Step Height: 4"   Rocker Board 3 minutes   Modalities   Modalities Designer, multimedia Location R knee   Electrical Stimulation Action IFC   Electrical Stimulation Parameters 1-10 Hz x15 min   Electrical Stimulation Goals Pain;Edema   Vasopneumatic   Number Minutes Vasopneumatic  15 minutes   Vasopnuematic  Location  Knee   Vasopneumatic Pressure Medium   Vasopneumatic Temperature  57   Manual Therapy   Manual Therapy Soft tissue mobilization;Passive ROM   Soft tissue mobilization R knee incision gentle mobilizations thorughout entire incision; R patellar mobility into L/R, sup/inf   Passive ROM PROM of R knee into flex/ext with gentle holds at end range                  PT Short Term Goals - 12/24/14 1310    PT SHORT TERM GOAL #1   Title Ind with HEP.   Time 3   Period Weeks   Status On-going   PT SHORT TERM GOAL #2   Title Achieve full active left knee extension.   Time 3   Period Weeks   Status On-going           PT Long Term Goals - 12/24/14 1310    PT LONG TERM GOAL #1   Title Active right knee flexion to 120 degrees+ so the patient can perform functional tasks and do so with pain not > 2-3/10.   Time 6   Period Weeks   Status On-going   PT LONG TERM GOAL #2   Title Increase right knee strength to a solid 5/5 to provide good stability for accomplishment of functional activities   Time 6   Period Weeks   Status On-going   PT LONG TERM GOAL #3   Title Perform a reciprocating stair gait with one railing with pain not > 2-3/10.   Time 6   Period Weeks   Status On-going   PT LONG TERM GOAL #4   Title Perform ADL's with pain not > 3/10.   Status On-going               Plan - 12/24/14 1347    Clinical Impression Statement Patient continues to tolerate today's treatment fairly well. Patient completed all exercises well after minimal verbal cueing and demonstration for proper form of exercise wth no report of increased pain except for at end range of forward lunges. R knee flexion ROM measured on forward lunge was 104 and 107 deg. AROM of R knee in supine taken today measured as 2-104 degrees. Demonstrates good R patellar mobility as well as incision mobility although she had some pinching in the superior incision. Presented with a small pouch of  inflammation in the lateral R knee around the area that the patient experiences the night time cramping.  Normal modaliites response noted following removal of the modalities. Denied R knee pain following today's treatment.   Pt will benefit from skilled therapeutic intervention in order to improve on the following deficits Pain;Decreased activity tolerance;Decreased range of motion   Rehab Potential Excellent   PT Frequency 2x / week   PT Duration 6 weeks   PT Treatment/Interventions ADLs/Self Care Home Management;Electrical Stimulation;Moist Heat;Therapeutic exercise;Therapeutic activities;Stair training;Neuromuscular re-education;Patient/family education;Manual techniques;Passive range of motion   PT Next Visit Plan Nustep; Right hamstring, ITB and tib ant STW; right total knee arthroplasty protocol.  No ultrasound.   Consulted and Agree with Plan of Care Patient        Problem List Patient Active Problem List   Diagnosis Date Noted  . 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 Chest pain 10/28/2010  . Hyperlipidemia 10/28/2010  . Mitral valve prolapse 10/28/2010    Wynelle Fanny, PTA 12/24/2014, 2:03 PM  [facility] [address] [place], [place], [address] Phone: [phone]   Fax:  [phone]

## 2014-12-30 ENCOUNTER — Ambulatory Visit: Payer: Medicare PPO | Admitting: Physical Therapy

## 2014-12-30 DIAGNOSIS — M25661 Stiffness of right knee, not elsewhere classified: Secondary | ICD-10-CM | POA: Diagnosis not present

## 2014-12-30 DIAGNOSIS — M25561 Pain in right knee: Secondary | ICD-10-CM

## 2014-12-30 NOTE — Therapy (Signed)
[facility] [place], [place], [address] Phone: [phone]   Fax:  [phone]  Physical Therapy Treatment  Patient Details  Name: Susan Nunez MRN: [id number] Date of Birth: March 13, 1941 No Data Recorded  Encounter Date: 12/30/2014      PT End of Session - 12/30/14 1002    Visit Number 5   Number of Visits 12   Date for PT Re-Evaluation 02/03/15   PT Start Time 0946   PT Stop Time 1044   PT Time Calculation (min) 58 min   Activity Tolerance Patient tolerated treatment well;Patient limited by pain   Behavior During Therapy [facility] for tasks assessed/performed      Past Medical History  Diagnosis Date  . Hyperlipidemia   . Hypertension   . Mitral valve prolapse     with mitral regurgitation   . Complication of anesthesia     difficulty waking up, difficulty peeing, extreme nausea & vomiting  . PONV (postoperative nausea and vomiting)   . Anxiety     when husband was ill and died  . Chronic kidney disease     stage 3....renal insufficiency  . GERD (gastroesophageal reflux disease)     not on daily basis  . Arthritis   . Diverticulosis     Past Surgical History  Procedure Laterality Date  . Cardiac catheterization  02/21/2010    Est. EF at 65% -- mooth and normal coronary arteries -- Normal left ventricular systolic function.  We will continue with medical therapy.  I suspect her chest pain was due to her mitral valve prolapse syndrome --  Thayer Headings, M.D.   . Total vaginal hysterectomy  12/18/2001  . Foot surgery  07/12/00    right  . Knee arthroscopy  06/14/2009  . Lumbar spine surgery      lumbar spine fusion L5-S1 performed 20 years ago x 3  . Back surgery      x 3  . Abdominal hysterectomy    . Joint replacement    . Total knee arthroplasty Right 11/04/2014    Procedure: RIGHT TOTAL KNEE ARTHROPLASTY;  Surgeon: Newt Minion, MD;  Location: [facility];  Service: Orthopedics;  Laterality: Right;     There were no vitals filed for this visit.  Visit Diagnosis:  Right knee pain  Knee stiffness, right      Subjective Assessment - 12/30/14 1040    Subjective My knee is feeling better with less spasms.   How long can you sit comfortably? 20 minutes   How long can you walk comfortably? No limit "just no energy"   Patient Stated Goals Get out of pain.   Pain Score 2    Pain Location Knee   Pain Orientation Right   Pain Descriptors / Indicators Sore   Pain Type Surgical pain   Pain Onset More than a month ago   Pain Frequency Intermittent            [facility] PT Assessment - 12/30/14 0001    AROM   Right Knee Flexion --  110 degrees.                     [facility] Adult PT Treatment/Exercise - 12/30/14 1055    Electrical Stimulation   Electrical Stimulation Location R knee   Electrical Stimulation Action IFC   Electrical Stimulation Parameters 1-10 HZ x 15 minutes.   Vasopneumatic   Number Minutes Vasopneumatic  15 minutes   Vasopnuematic Location  --  Right knne.   Vasopneumatic Pressure Medium      STW/M:  18 minutes to patient's right posterior knee and lateral knee region.            PT Short Term Goals - 12/24/14 1310    PT SHORT TERM GOAL #1   Title Ind with HEP.   Time 3   Period Weeks   Status On-going   PT SHORT TERM GOAL #2   Title Achieve full active left knee extension.   Time 3   Period Weeks   Status On-going           PT Long Term Goals - 12/24/14 1310    PT LONG TERM GOAL #1   Title Active right knee flexion to 120 degrees+ so the patient can perform functional tasks and do so with pain not > 2-3/10.   Time 6   Period Weeks   Status On-going   PT LONG TERM GOAL #2   Title Increase right knee strength to a solid 5/5 to provide good stability for accomplishment of functional activities   Time 6   Period Weeks   Status On-going   PT LONG TERM GOAL #3   Title Perform a reciprocating stair gait with one railing with  pain not > 2-3/10.   Time 6   Period Weeks   Status On-going   PT LONG TERM GOAL #4   Title Perform ADL's with pain not > 3/10.   Status On-going               Problem List Patient Active Problem List   Diagnosis Date Noted  . 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 Chest pain 10/28/2010  . Hyperlipidemia 10/28/2010  . Mitral valve prolapse 10/28/2010    [name], [name] 12/30/2014, 11:40 AM  [facility] [address] [place], [place], [address] Phone: [phone]   Fax:  [phone]  Name: RENADA Nunez MRN: [id number] Date of Birth: 12-Aug-1940

## 2015-01-06 ENCOUNTER — Ambulatory Visit: Payer: Medicare PPO | Admitting: Physical Therapy

## 2015-01-06 ENCOUNTER — Encounter: Payer: Self-pay | Admitting: Physical Therapy

## 2015-01-06 DIAGNOSIS — M25561 Pain in right knee: Secondary | ICD-10-CM | POA: Diagnosis not present

## 2015-01-06 DIAGNOSIS — M25661 Stiffness of right knee, not elsewhere classified: Secondary | ICD-10-CM | POA: Diagnosis not present

## 2015-01-06 NOTE — Patient Instructions (Signed)
    Knee Flexion Stretch on Step  Place foot on step and lean forward until you feel a good stretch in front of knee.   hold 30 sec x 5-10 perform 2-4 x daily  Knee Extension Mobilization: Towel Prop   With rolled towel under right ankle, place _1-5___ pound weight across knee. Hold __5+__ minutes. Repeat __2-3__ times per set. Do __2__ sets per session. Do __2-4__ sessions per day.

## 2015-01-06 NOTE — Therapy (Signed)
[facility] [place], [place], [address] Phone: [phone]   Fax:  [phone]  Physical Therapy Treatment  Patient Details  Name: Susan Nunez MRN: [id number] Date of Birth: 07/03/1940 No Data Recorded  Encounter Date: 01/06/2015      PT End of Session - 01/06/15 1015    Visit Number 6   Number of Visits 12   Date for PT Re-Evaluation 02/03/15   PT Start Time 0946   PT Stop Time 1042   PT Time Calculation (min) 56 min   Activity Tolerance Patient tolerated treatment well;Patient limited by pain   Behavior During Therapy [facility] for tasks assessed/performed      Past Medical History  Diagnosis Date  . Hyperlipidemia   . Hypertension   . Mitral valve prolapse     with mitral regurgitation   . Complication of anesthesia     difficulty waking up, difficulty peeing, extreme nausea & vomiting  . PONV (postoperative nausea and vomiting)   . Anxiety     when husband was ill and died  . Chronic kidney disease     stage 3....renal insufficiency  . GERD (gastroesophageal reflux disease)     not on daily basis  . Arthritis   . Diverticulosis     Past Surgical History  Procedure Laterality Date  . Cardiac catheterization  02/21/2010    Est. EF at 65% -- mooth and normal coronary arteries -- Normal left ventricular systolic function.  We will continue with medical therapy.  I suspect her chest pain was due to her mitral valve prolapse syndrome --  Thayer Headings, M.D.   . Total vaginal hysterectomy  12/18/2001  . Foot surgery  07/12/00    right  . Knee arthroscopy  06/14/2009  . Lumbar spine surgery      lumbar spine fusion L5-S1 performed 20 years ago x 3  . Back surgery      x 3  . Abdominal hysterectomy    . Joint replacement    . Total knee arthroplasty Right 11/04/2014    Procedure: RIGHT TOTAL KNEE ARTHROPLASTY;  Surgeon: Newt Minion, MD;  Location: [facility];  Service: Orthopedics;  Laterality: Right;     There were no vitals filed for this visit.  Visit Diagnosis:  Right knee pain  Knee stiffness, right      Subjective Assessment - 01/06/15 0949    Subjective patient reported less pain everyday and feels like knee is getting better   How long can you sit comfortably? 20 minutes   How long can you walk comfortably? No limit "just no energy"   Patient Stated Goals Get out of pain.   Currently in Pain? Yes   Pain Score 2    Pain Location Knee   Pain Orientation Right   Pain Descriptors / Indicators Sore   Pain Type Surgical pain   Pain Onset More than a month ago   Pain Frequency Intermittent   Aggravating Factors  increased activity   Pain Relieving Factors rest            [facility] PT Assessment - 01/06/15 0001    ROM / Strength   AROM / PROM / Strength PROM;AROM   AROM   Overall AROM  Deficits   AROM Assessment Site Knee   Right/Left Knee Right   Right Knee Extension -8   Right Knee Flexion 110   PROM   Overall PROM  Deficits   PROM Assessment Site  Knee   Right/Left Knee Right   Right Knee Extension -5   Right Knee Flexion 110                     [facility] Adult PT Treatment/Exercise - 01/06/15 0001    Knee/Hip Exercises: Stretches   Knee: Self-Stretch to increase Flexion Right;3 reps;30 seconds   Knee/Hip Exercises: Aerobic   Recumbent Bike x4mn L1   Knee/Hip Exercises: Standing   Lateral Step Up Right;3 sets;10 reps;Step Height: 6"   Forward Step Up Right;3 sets;10 reps;Step Height: 6"   Rocker Board 3 minutes   EAcupuncturistLocation R knee   Electrical Stimulation Action IFC   Electrical Stimulation Parameters 1-10hz   Electrical Stimulation Goals Pain;Edema   Vasopneumatic   Number Minutes Vasopneumatic  15 minutes   Vasopnuematic Location  Knee   Vasopneumatic Pressure Medium   Manual Therapy   Manual Therapy Passive ROM   Passive ROM PROM of R knee into flex/ext with gentle holds at end range                 PT Education - 01/06/15 1023    Education provided Yes   Education Details HEP self stretching   Person(s) Educated Patient   Methods Explanation;Demonstration;Handout   Comprehension Verbalized understanding;Returned demonstration          PT Short Term Goals - 01/06/15 1016    PT SHORT TERM GOAL #1   Title Ind with HEP.   Time 3   Period Weeks   Status Achieved   PT SHORT TERM GOAL #2   Title Achieve full active left knee extension.   Time 3   Period Weeks   Status On-going  AROM -8 degrees (01/06/15)           PT Long Term Goals - 01/06/15 1020    PT LONG TERM GOAL #1   Title Active right knee flexion to 120 degrees+ so the patient can perform functional tasks and do so with pain not > 2-3/10.   Time 6   Period Weeks   Status On-going  AROM 110 (01/06/15)   PT LONG TERM GOAL #2   Title Increase right knee strength to a solid 5/5 to provide good stability for accomplishment of functional activities   Time 6   Period Weeks   Status On-going   PT LONG TERM GOAL #3   Title Perform a reciprocating stair gait with one railing with pain not > 2-3/10.   Time 6   Period Weeks   Status On-going   PT LONG TERM GOAL #4   Title Perform ADL's with pain not > 3/10.   Time 6   Period Weeks   Status Achieved  2/10               Plan - 01/06/15 1030    Clinical Impression Statement Patient progressing slowly with ROm today due to pain and ROM limitations. Patient was given a HEP for self stretching to improve range and functional independence. Patient is able to perform ADL's with pain no more than 2/10. STG #1 met and LTG #4 met, others ongoing due to full active ROM limitations and strength deficits.   Pt will benefit from skilled therapeutic intervention in order to improve on the following deficits Pain;Decreased activity tolerance;Decreased range of motion   Rehab Potential Excellent   PT Frequency 2x / week   PT Duration 6 weeks   PT  Treatment/Interventions ADLs/Self Care Home Management;Electrical Stimulation;Moist Heat;Therapeutic exercise;Therapeutic activities;Stair training;Neuromuscular re-education;Patient/family education;Manual techniques;Passive range of motion   PT Next Visit Plan cont with POC (MD 01/14/15 Meridee Score)   Consulted and Agree with Plan of Care Patient        Problem List Patient Active Problem List   Diagnosis Date Noted  . 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 Chest pain 10/28/2010  . Hyperlipidemia 10/28/2010  . Mitral valve prolapse 10/28/2010    Carollee Nussbaumer P, PTA 01/06/2015, 10:53 AM  [facility] [place], [place], [address] Phone: [phone]   Fax:  [phone]  Name: Susan Nunez MRN: [id number] Date of Birth: November 01, 1940

## 2015-01-12 ENCOUNTER — Ambulatory Visit: Payer: Medicare PPO | Attending: Orthopedic Surgery | Admitting: Physical Therapy

## 2015-01-12 DIAGNOSIS — R531 Weakness: Secondary | ICD-10-CM | POA: Diagnosis not present

## 2015-01-12 DIAGNOSIS — M25561 Pain in right knee: Secondary | ICD-10-CM | POA: Diagnosis not present

## 2015-01-12 DIAGNOSIS — M25661 Stiffness of right knee, not elsewhere classified: Secondary | ICD-10-CM | POA: Diagnosis not present

## 2015-01-12 NOTE — Therapy (Addendum)
[facility], [place], [address] Phone: [phone]   Fax:  [phone]  Physical Therapy Treatment  Patient Details  Name: Susan Nunez MRN: [id number] Date of Birth: [birth year] No Data Recorded  Encounter Date: 01/12/2015    Past Medical History:  Diagnosis Date  . Anxiety    when husband was ill and died  . Arthritis   . Chronic kidney disease    stage 3....renal insufficiency  . Complication of anesthesia    difficulty waking up, difficulty peeing, extreme nausea & vomiting  . Diverticulosis   . GERD (gastroesophageal reflux disease)    not on daily basis  . Hyperlipidemia   . Hypertension   . Mitral valve prolapse    with mitral regurgitation   . PONV (postoperative nausea and vomiting)     Past Surgical History:  Procedure Laterality Date  . ABDOMINAL HYSTERECTOMY    . BACK SURGERY     x 3  . CARDIAC CATHETERIZATION  02/21/2010   Est. EF at 65% -- mooth and normal coronary arteries -- Normal left ventricular systolic function.  We will continue with medical therapy.  I suspect her chest pain was due to her mitral valve prolapse syndrome --  Thayer Headings, M.D.   . FOOT SURGERY  07/12/00   right  . JOINT REPLACEMENT    . KNEE ARTHROSCOPY  06/14/2009  . LUMBAR SPINE SURGERY     lumbar spine fusion L5-S1 performed 20 years ago x 3  . TOTAL KNEE ARTHROPLASTY Right 11/04/2014   Procedure: RIGHT TOTAL KNEE ARTHROPLASTY;  Surgeon: Newt Minion, MD;  Location: [place];  Service: Orthopedics;  Laterality: Right;  . TOTAL VAGINAL HYSTERECTOMY  12/18/2001    There were no vitals filed for this visit.  Visit Diagnosis:  Knee stiffness, right  Right knee pain  Weakness                                 PT Short Term Goals - 01/12/15 1006      PT SHORT TERM GOAL #1   Title Ind with HEP.   Time 3   Period Weeks   Status Achieved     PT SHORT TERM GOAL #2   Title  Achieve full active left knee extension.   Time 3   Period Weeks   Status On-going           PT Long Term Goals - 01/12/15 1007      PT LONG TERM GOAL #1   Title Active right knee flexion to 120 degrees+ so the patient can perform functional tasks and do so with pain not > 2-3/10.   Time 6   Period Weeks   Status On-going     PT LONG TERM GOAL #2   Title Increase right knee strength to a solid 5/5 to provide good stability for accomplishment of functional activities   Time 6   Period Weeks   Status On-going     PT LONG TERM GOAL #3   Title Perform a reciprocating stair gait with one railing with pain not > 2-3/10.   Baseline able to do but can't control descent with R quad.   Time 6   Period Weeks   Status On-going     PT LONG TERM GOAL #4   Title Perform ADL's with pain not > 3/10.   Time 6   Period  Weeks   Status Achieved               Plan - 01/12/15 1500    Clinical Impression Statement Patient is progressing with ROM and has reached a functional range of flexion at 116 degrees. She still is limited with active ext to -7 (passive -3).  Her R knee strength tests out at 5/5 however she demos eccentric quad and bil hip ABD/rotator weakness with stairs and squats. Her pain goal has been met.    Pt will benefit from skilled therapeutic intervention in order to improve on the following deficits Pain;Decreased activity tolerance;Decreased range of motion   Rehab Potential Excellent   PT Frequency 2x / week   PT Duration 6 weeks   PT Treatment/Interventions ADLs/Self Care Home Management;Electrical Stimulation;Moist Heat;Therapeutic exercise;Therapeutic activities;Stair training;Neuromuscular re-education;Patient/family education;Manual techniques;Passive range of motion;Vasopneumatic Device   PT Next Visit Plan cont eccentric quad and hip ABD/rotator strenghthening to improve stairs and squats; continue ROM. Pt seeing Dr. Sharol Given 01/14/15.   Consulted and Agree with  Plan of Care Patient        Problem List Patient Active Problem List   Diagnosis Date Noted  . 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 Chest pain 10/28/2010  . Hyperlipidemia 10/28/2010  . Mitral valve prolapse 10/28/2010    Madelyn Flavors PT  02/07/2016, 6:47 PM  [facility], [place], [address] Phone: [phone]   Fax:  [phone]  Name: ANGELLYNN KIMBERLIN MRN: [id number] Date of Birth: [birth year]   PHYSICAL THERAPY DISCHARGE SUMMARY  Visits from Start of Care: 7.  Current functional level related to goals / functional outcomes: Please see above.   Remaining deficits: Continued loss of right knee ROM.   Education / Equipment: HEP. Plan: Patient agrees to discharge.  Patient goals were partially met. Patient is being discharged due to being pleased with the current functional level.  ?????         Mali Applegate MPT

## 2015-01-25 ENCOUNTER — Other Ambulatory Visit (HOSPITAL_COMMUNITY): Payer: Self-pay | Admitting: Family Medicine

## 2015-01-25 DIAGNOSIS — Z1231 Encounter for screening mammogram for malignant neoplasm of breast: Secondary | ICD-10-CM

## 2015-01-28 ENCOUNTER — Ambulatory Visit (HOSPITAL_COMMUNITY)
Admission: RE | Admit: 2015-01-28 | Discharge: 2015-01-28 | Disposition: A | Discharge status: Home / Self Care | Payer: Medicare PPO | Source: Ambulatory Visit | Attending: Family Medicine | Admitting: Family Medicine

## 2015-01-28 DIAGNOSIS — Z1231 Encounter for screening mammogram for malignant neoplasm of breast: Secondary | ICD-10-CM | POA: Insufficient documentation

## 2015-02-12 DIAGNOSIS — M1711 Unilateral primary osteoarthritis, right knee: Secondary | ICD-10-CM | POA: Diagnosis not present

## 2015-04-21 ENCOUNTER — Other Ambulatory Visit (HOSPITAL_COMMUNITY): Payer: Self-pay | Admitting: Family Medicine

## 2015-04-21 DIAGNOSIS — Z780 Asymptomatic menopausal state: Secondary | ICD-10-CM

## 2015-04-21 DIAGNOSIS — M8580 Other specified disorders of bone density and structure, unspecified site: Secondary | ICD-10-CM

## 2015-04-28 ENCOUNTER — Ambulatory Visit (HOSPITAL_COMMUNITY)
Admission: RE | Admit: 2015-04-28 | Discharge: 2015-04-28 | Disposition: A | Discharge status: Home / Self Care | Payer: Medicare Other | Source: Ambulatory Visit | Attending: Family Medicine | Admitting: Family Medicine

## 2015-04-28 DIAGNOSIS — M8580 Other specified disorders of bone density and structure, unspecified site: Secondary | ICD-10-CM | POA: Insufficient documentation

## 2015-04-28 DIAGNOSIS — Z780 Asymptomatic menopausal state: Secondary | ICD-10-CM | POA: Insufficient documentation

## 2016-01-13 ENCOUNTER — Other Ambulatory Visit (HOSPITAL_COMMUNITY): Payer: Self-pay | Admitting: Family Medicine

## 2016-01-13 DIAGNOSIS — Z1231 Encounter for screening mammogram for malignant neoplasm of breast: Secondary | ICD-10-CM

## 2016-01-17 ENCOUNTER — Ambulatory Visit (INDEPENDENT_AMBULATORY_CARE_PROVIDER_SITE_OTHER): Payer: Self-pay | Admitting: Orthopedic Surgery

## 2016-02-10 ENCOUNTER — Ambulatory Visit (HOSPITAL_COMMUNITY)
Admission: RE | Admit: 2016-02-10 | Discharge: 2016-02-10 | Disposition: A | Discharge status: Home / Self Care | Payer: Medicare Other | Source: Ambulatory Visit | Attending: Family Medicine | Admitting: Family Medicine

## 2016-02-10 DIAGNOSIS — Z1231 Encounter for screening mammogram for malignant neoplasm of breast: Secondary | ICD-10-CM | POA: Diagnosis not present

## 2017-03-14 ENCOUNTER — Other Ambulatory Visit (HOSPITAL_COMMUNITY): Payer: Self-pay | Admitting: Family Medicine

## 2017-03-14 DIAGNOSIS — Z1231 Encounter for screening mammogram for malignant neoplasm of breast: Secondary | ICD-10-CM

## 2017-03-21 ENCOUNTER — Ambulatory Visit (HOSPITAL_COMMUNITY)
Admission: RE | Admit: 2017-03-21 | Discharge: 2017-03-21 | Disposition: A | Discharge status: Home / Self Care | Payer: Medicare Other | Source: Ambulatory Visit | Attending: Family Medicine | Admitting: Family Medicine

## 2017-03-21 DIAGNOSIS — Z1231 Encounter for screening mammogram for malignant neoplasm of breast: Secondary | ICD-10-CM | POA: Diagnosis not present

## 2017-05-07 ENCOUNTER — Other Ambulatory Visit: Payer: Self-pay

## 2017-05-07 ENCOUNTER — Emergency Department (HOSPITAL_COMMUNITY)
Admission: EM | Admit: 2017-05-07 | Discharge: 2017-05-07 | Disposition: A | Discharge status: Home / Self Care | Payer: Medicare Other | Attending: Emergency Medicine | Admitting: Emergency Medicine

## 2017-05-07 ENCOUNTER — Encounter (HOSPITAL_COMMUNITY): Payer: Self-pay | Admitting: Emergency Medicine

## 2017-05-07 ENCOUNTER — Emergency Department (HOSPITAL_COMMUNITY): Payer: Medicare Other

## 2017-05-07 DIAGNOSIS — N183 Chronic kidney disease, stage 3 (moderate): Secondary | ICD-10-CM | POA: Diagnosis not present

## 2017-05-07 DIAGNOSIS — Y9389 Activity, other specified: Secondary | ICD-10-CM | POA: Insufficient documentation

## 2017-05-07 DIAGNOSIS — R112 Nausea with vomiting, unspecified: Secondary | ICD-10-CM | POA: Insufficient documentation

## 2017-05-07 DIAGNOSIS — Z96651 Presence of right artificial knee joint: Secondary | ICD-10-CM | POA: Insufficient documentation

## 2017-05-07 DIAGNOSIS — W0110XA Fall on same level from slipping, tripping and stumbling with subsequent striking against unspecified object, initial encounter: Secondary | ICD-10-CM | POA: Insufficient documentation

## 2017-05-07 DIAGNOSIS — Y999 Unspecified external cause status: Secondary | ICD-10-CM | POA: Insufficient documentation

## 2017-05-07 DIAGNOSIS — Z7982 Long term (current) use of aspirin: Secondary | ICD-10-CM | POA: Insufficient documentation

## 2017-05-07 DIAGNOSIS — R55 Syncope and collapse: Secondary | ICD-10-CM | POA: Insufficient documentation

## 2017-05-07 DIAGNOSIS — S0003XA Contusion of scalp, initial encounter: Secondary | ICD-10-CM | POA: Diagnosis not present

## 2017-05-07 DIAGNOSIS — Y929 Unspecified place or not applicable: Secondary | ICD-10-CM | POA: Diagnosis not present

## 2017-05-07 DIAGNOSIS — I129 Hypertensive chronic kidney disease with stage 1 through stage 4 chronic kidney disease, or unspecified chronic kidney disease: Secondary | ICD-10-CM | POA: Insufficient documentation

## 2017-05-07 DIAGNOSIS — S0990XA Unspecified injury of head, initial encounter: Secondary | ICD-10-CM

## 2017-05-07 DIAGNOSIS — R197 Diarrhea, unspecified: Secondary | ICD-10-CM | POA: Diagnosis not present

## 2017-05-07 DIAGNOSIS — Z79899 Other long term (current) drug therapy: Secondary | ICD-10-CM | POA: Diagnosis not present

## 2017-05-07 LAB — CBC WITH DIFFERENTIAL/PLATELET
BASOS ABS: 0 10*3/uL (ref 0.0–0.1)
BASOS PCT: 0 %
Eosinophils Absolute: 0 10*3/uL (ref 0.0–0.7)
Eosinophils Relative: 0 %
HEMATOCRIT: 38.9 % (ref 36.0–46.0)
HEMOGLOBIN: 12.3 g/dL (ref 12.0–15.0)
Lymphocytes Relative: 14 %
Lymphs Abs: 1.4 10*3/uL (ref 0.7–4.0)
MCH: 31.5 pg (ref 26.0–34.0)
MCHC: 31.6 g/dL (ref 30.0–36.0)
MCV: 99.5 fL (ref 78.0–100.0)
Monocytes Absolute: 0.8 10*3/uL (ref 0.1–1.0)
Monocytes Relative: 7 %
NEUTROS ABS: 8.2 10*3/uL — AB (ref 1.7–7.7)
NEUTROS PCT: 79 %
Platelets: 273 10*3/uL (ref 150–400)
RBC: 3.91 MIL/uL (ref 3.87–5.11)
RDW: 14 % (ref 11.5–15.5)
WBC: 10.4 10*3/uL (ref 4.0–10.5)

## 2017-05-07 LAB — BASIC METABOLIC PANEL
ANION GAP: 7 (ref 5–15)
BUN: 26 mg/dL — ABNORMAL HIGH (ref 6–20)
CO2: 23 mmol/L (ref 22–32)
Calcium: 10.3 mg/dL (ref 8.9–10.3)
Chloride: 109 mmol/L (ref 101–111)
Creatinine, Ser: 1.41 mg/dL — ABNORMAL HIGH (ref 0.44–1.00)
GFR, EST AFRICAN AMERICAN: 41 mL/min — AB (ref >60)
GFR, EST NON AFRICAN AMERICAN: 36 mL/min — AB (ref >60)
Glucose, Bld: 120 mg/dL — ABNORMAL HIGH (ref 65–99)
POTASSIUM: 4.4 mmol/L (ref 3.5–5.1)
Sodium: 139 mmol/L (ref 135–145)

## 2017-05-07 MED ORDER — ONDANSETRON 8 MG PO TBDP: ORAL_TABLET | ORAL | 0 refills | Status: DC

## 2017-05-07 MED ORDER — SODIUM CHLORIDE 0.9 % IV BOLUS (SEPSIS)
1000.0000 mL | Freq: Once | INTRAVENOUS | Status: AC
  Administered 2017-05-07: 1000 mL via INTRAVENOUS

## 2017-05-07 NOTE — ED Provider Notes (Signed)
[facility] EMERGENCY DEPARTMENT Provider Note   CSN: [id number] Arrival date & time: 05/07/17  1522     History   Chief Complaint Chief Complaint  Patient presents with  . Fall    HPI Susan Nunez is a 77 y.o. female.  HPI patient was doing well until this morning when she developed vomiting and diarrhea.  She had one episode of vomiting, nonbloody, nonbilious and 4 or 5 episodes of nonbloody diarrhea.  Actively vomiting she suffered a syncopal event causing her to strike the left side of her head.  EMS was called.  EMS treated patient with intravenous Zofran and placed hard cervical collar on the patient.  She present complains of left-sided headache.  No other complaints.  She is not nauseated at present.  Denies abdominal pain no fever nothing makes symptoms better or worse.  She denies recent travel or antibiotic use.  Past Medical History:  Diagnosis Date  . Anxiety    when husband was ill and died  . Arthritis   . Chronic kidney disease    stage 3....renal insufficiency  . Complication of anesthesia    difficulty waking up, difficulty peeing, extreme nausea & vomiting  . Diverticulosis   . GERD (gastroesophageal reflux disease)    not on daily basis  . Hyperlipidemia   . Hypertension   . Mitral valve prolapse    with mitral regurgitation   . PONV (postoperative nausea and vomiting)     Patient Active Problem List   Diagnosis Date Noted  . 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 Chest pain 10/28/2010  . Hyperlipidemia 10/28/2010  . Mitral valve prolapse 10/28/2010    Past Surgical History:  Procedure Laterality Date  . ABDOMINAL HYSTERECTOMY    . BACK SURGERY     x 3  . CARDIAC CATHETERIZATION  02/21/2010   Est. EF at 65% -- mooth and normal coronary arteries -- Normal left ventricular systolic function.  We will continue with medical therapy.  I suspect her chest pain was due to her mitral valve prolapse syndrome --  Thayer Headings, M.D.   . FOOT  SURGERY  07/12/00   right  . JOINT REPLACEMENT    . KNEE ARTHROSCOPY  06/14/2009  . LUMBAR SPINE SURGERY     lumbar spine fusion L5-S1 performed 20 years ago x 3  . TOTAL KNEE ARTHROPLASTY Right 11/04/2014   Procedure: RIGHT TOTAL KNEE ARTHROPLASTY;  Surgeon: Newt Minion, MD;  Location: [facility];  Service: Orthopedics;  Laterality: Right;  . TOTAL VAGINAL HYSTERECTOMY  12/18/2001    OB History    No data available       Home Medications    Prior to Admission medications   Medication Sig Start Date End Date Taking? Authorizing Provider  aspirin 81 MG tablet Take 81 mg by mouth daily.     Yes [provider]  B Complex Vitamins (VITAMIN B COMPLEX PO) Take 1 tablet by mouth daily.    Yes [provider]  Cholecalciferol (VITAMIN D3) 2000 UNITS TABS Take 2,000 Units by mouth daily.   Yes [provider]  lisinopril (PRINIVIL,ZESTRIL) 5 MG tablet Take 5 mg by mouth daily.     Yes [provider]  LORazepam (ATIVAN) 0.5 MG tablet Take 0.5-1 tablets by mouth every 6 (six) hours as needed. 04/25/17  Yes [provider]  Multiple Vitamins-Minerals (CENTRUM SILVER PO) Take 1 tablet by mouth daily.   Yes [provider]  Multiple Vitamins-Minerals (  EYE VITAMINS PO) Take 1 tablet by mouth daily.   Yes [provider]  omeprazole (PRILOSEC OTC) 20 MG tablet Take 20 mg by mouth daily as needed (for heartburn).    Yes [provider]    Family History Family History  Problem Relation Age of Onset  . Coronary artery disease Father   . Heart attack Father   . Hypertension Mother     Social History Social History   Tobacco Use  . Smoking status: Never Smoker  . Smokeless tobacco: Never Used  Substance Use Topics  . Alcohol use: No  . Drug use: No     Allergies   Citalopram; Ciprofloxacin hcl; Demerol; Flagyl [metronidazole]; Lovastatin; Penicillins; and Sertraline hcl   Review of Systems Review of Systems    Constitutional: Negative.   HENT: Negative.   Respiratory: Negative.   Cardiovascular: Negative.   Gastrointestinal: Positive for diarrhea and vomiting.  Musculoskeletal: Negative.   Skin: Negative.   Neurological: Positive for headaches. Negative for light-headedness.  Psychiatric/Behavioral: Negative.   All other systems reviewed and are negative.    Physical Exam Updated Vital Signs BP 139/89   Pulse 78   Temp 97.8 F (36.6 C) (Oral)   Resp 18   Ht 5\' 9"  (1.753 m)   Wt 77.6 kg (171 lb)   SpO2 100%   BMI 25.25 kg/m   Physical Exam  Constitutional: She is oriented to person, place, and time. She appears well-developed and well-nourished. No distress.  Glasgow Coma Score 15  HENT:  Head: Normocephalic and atraumatic.  Baseball sized hematoma at left occipital parietal area.  Scalp is intact.  No battle sign.  No raccoons eyes.  Otherwise normocephalic atraumatic  Eyes: Conjunctivae are normal. Pupils are equal, round, and reactive to light.  Neck: Normal range of motion. Neck supple. No tracheal deviation present. No thyromegaly present.  No tenderness  Cardiovascular: Normal rate and regular rhythm.  No murmur heard. Pulmonary/Chest: Effort normal and breath sounds normal.  Abdominal: Soft. Bowel sounds are normal. She exhibits no distension. There is no tenderness.  Musculoskeletal: Normal range of motion. She exhibits no edema or tenderness.  Entire spine nontender.  Pelvis stable nontender.  All 4 extremities without contusion abrasion or tenderness neurovascular intact  Neurological: She is alert and oriented to person, place, and time. Coordination normal.  Gait normal not lightheaded on standing.  Motor strength 5/5 overall  Skin: Skin is warm and dry. No rash noted.  Psychiatric: She has a normal mood and affect.  Nursing note and vitals reviewed.    ED Treatments / Results  Labs (all labs ordered are listed, but only abnormal results are displayed) Labs  Reviewed  BASIC METABOLIC PANEL  CBC WITH DIFFERENTIAL/PLATELET    EKG  EKG Interpretation  Date/Time:  Monday May 07 2017 15:31:30 EST Ventricular Rate:  78 PR Interval:    QRS Duration: 100 QT Interval:  371 QTC Calculation: 423 R Axis:   -31 Text Interpretation:  Sinus rhythm Left axis deviation Low voltage, precordial leads Abnormal R-wave progression, early transition No significant change since last tracing Reconfirmed by Orlie Dakin [phone]) on 05/07/2017 4:20:12 PM       Radiology No results found.  Procedures Procedures (including critical care time)  Medications Ordered in ED Medications  sodium chloride 0.9 % bolus 1,000 mL (not administered)   Results for orders placed or performed during the hospital encounter of 25/63/89  Basic metabolic panel  Result Value Ref Range  Sodium 139 135 - 145 mmol/L   Potassium 4.4 3.5 - 5.1 mmol/L   Chloride 109 101 - 111 mmol/L   CO2 23 22 - 32 mmol/L   Glucose, Bld 120 (H) 65 - 99 mg/dL   BUN 26 (H) 6 - 20 mg/dL   Creatinine, Ser 1.41 (H) 0.44 - 1.00 mg/dL   Calcium 10.3 8.9 - 10.3 mg/dL   GFR calc non Af Amer 36 (L) >60 mL/min   GFR calc Af Amer 41 (L) >60 mL/min   Anion gap 7 5 - 15  CBC with Differential/Platelet  Result Value Ref Range   WBC 10.4 4.0 - 10.5 K/uL   RBC 3.91 3.87 - 5.11 MIL/uL   Hemoglobin 12.3 12.0 - 15.0 g/dL   HCT 38.9 36.0 - 46.0 %   MCV 99.5 78.0 - 100.0 fL   MCH 31.5 26.0 - 34.0 pg   MCHC 31.6 30.0 - 36.0 g/dL   RDW 14.0 11.5 - 15.5 %   Platelets 273 150 - 400 K/uL   Neutrophils Relative % 79 %   Neutro Abs 8.2 (H) 1.7 - 7.7 K/uL   Lymphocytes Relative 14 %   Lymphs Abs 1.4 0.7 - 4.0 K/uL   Monocytes Relative 7 %   Monocytes Absolute 0.8 0.1 - 1.0 K/uL   Eosinophils Relative 0 %   Eosinophils Absolute 0.0 0.0 - 0.7 K/uL   Basophils Relative 0 %   Basophils Absolute 0.0 0.0 - 0.1 K/uL   Ct Head Wo Contrast  Result Date: 05/07/2017 CLINICAL DATA:  77 year old female with  nausea, vomiting and diarrhea. Vomiting and lost consciousness hitting left side of head. Initial encounter. EXAM: CT HEAD WITHOUT CONTRAST TECHNIQUE: Contiguous axial images were obtained from the base of the skull through the vertex without intravenous contrast. COMPARISON:  None. FINDINGS: Brain: No intracranial hemorrhage or CT evidence of large acute infarct. No intracranial mass lesion noted on this unenhanced exam. Vascular: No hyperdense vessel. Partial calcification carotid artery cavernous segment bilaterally. Skull: No skull fracture. Sinuses/Orbits: No acute orbital abnormality. Visualized paranasal sinuses clear. Mastoid air cells and middle ear cavities clear. Other: Left parietal scalp hematoma. IMPRESSION: Left parietal scalp hematoma without underlying fracture or intracranial hemorrhage. Electronically Signed   By: Genia Del M.D.   On: 05/07/2017 17:11    Initial Impression / Assessment and Plan / ED Course  I have reviewed the triage vital signs and the nursing notes.  Pertinent labs & imaging results that were available during my care of the patient were reviewed by me and considered in my medical decision making (see chart for details).   5:30 PM patient is alert Glasgow Coma Score 15 ambulates without difficulty not lightheaded on standing after treatment with intravenous normal saline.  Cervical spine cleared Via Nexus criteria.  Head CT scan ordered due to scalp hematoma.  Syncope felt to be vasovagal in etiology.  Lab work reviewed.  Renal insufficiency is chronic Plan prescription Zofran, Imodium.  Encourage oral hydration.  Avoid dairy.  Follow-up with PMD if continues to have vomiting or diarrhea in 3 or 4 days.  Final Clinical Impressions(s) / ED Diagnoses  Diagnoses #1 nausea vomiting diarrhea #2 vasovagal syncope #3 Minor closed head trauma #4 chronic renal insufficiency Final diagnoses:  None    ED Discharge Orders    None       Orlie Dakin,  MD 05/07/17 1746

## 2017-05-07 NOTE — ED Triage Notes (Addendum)
Pt c/o of n/v/d since 7 am this morning. Pt states she was throwing up at noon and loss consciousness.  Pt fell hitting left side of head. A/o at this time.

## 2017-05-07 NOTE — Discharge Instructions (Signed)
Make sure that you drink at least six 8 ounce glasses of water or Gatorade each day in order to stay well-hydrated.  Take Imodium as directed for diarrhea.Take the medication prescribed as needed for nausea.  Avoid milk or foods containing milk such as cheese or ice cream while having diarrhea.  Take Tylenol as directed for aches.  See Dr. Addison Lank if you continue to have vomiting or diarrhea in 3 or 4 days.  Return if your condition worsens for any reason

## 2017-05-08 ENCOUNTER — Ambulatory Visit: Payer: Medicare Other | Admitting: Cardiovascular Disease

## 2017-05-09 ENCOUNTER — Other Ambulatory Visit (HOSPITAL_COMMUNITY): Payer: Self-pay | Admitting: Family Medicine

## 2017-05-09 DIAGNOSIS — M8588 Other specified disorders of bone density and structure, other site: Secondary | ICD-10-CM

## 2017-05-16 ENCOUNTER — Ambulatory Visit (HOSPITAL_COMMUNITY)
Admission: RE | Admit: 2017-05-16 | Discharge: 2017-05-16 | Disposition: A | Discharge status: Home / Self Care | Payer: Medicare Other | Source: Ambulatory Visit | Attending: Family Medicine | Admitting: Family Medicine

## 2017-05-16 DIAGNOSIS — M8589 Other specified disorders of bone density and structure, multiple sites: Secondary | ICD-10-CM | POA: Diagnosis not present

## 2017-05-16 DIAGNOSIS — M8588 Other specified disorders of bone density and structure, other site: Secondary | ICD-10-CM

## 2017-05-16 DIAGNOSIS — M85832 Other specified disorders of bone density and structure, left forearm: Secondary | ICD-10-CM | POA: Diagnosis present

## 2018-03-25 ENCOUNTER — Other Ambulatory Visit (HOSPITAL_COMMUNITY): Payer: Self-pay | Admitting: Family Medicine

## 2018-03-25 DIAGNOSIS — Z1231 Encounter for screening mammogram for malignant neoplasm of breast: Secondary | ICD-10-CM

## 2018-04-03 ENCOUNTER — Ambulatory Visit (HOSPITAL_COMMUNITY)
Admission: RE | Admit: 2018-04-03 | Discharge: 2018-04-03 | Disposition: A | Discharge status: Home / Self Care | Payer: Medicare Other | Source: Ambulatory Visit | Attending: Family Medicine | Admitting: Family Medicine

## 2018-04-03 ENCOUNTER — Encounter (HOSPITAL_COMMUNITY): Payer: Self-pay

## 2018-04-03 DIAGNOSIS — Z1231 Encounter for screening mammogram for malignant neoplasm of breast: Secondary | ICD-10-CM

## 2018-06-21 ENCOUNTER — Telehealth: Payer: Self-pay

## 2018-06-21 NOTE — Telephone Encounter (Signed)
Spoke with pt and she prefers to come in to have an office visit. Per Sharyn Lull, her or Dr. Acie Fredrickson will be reaching out to pt next week.

## 2018-07-08 ENCOUNTER — Ambulatory Visit: Payer: Medicare Other | Admitting: Cardiovascular Disease

## 2018-10-18 ENCOUNTER — Other Ambulatory Visit: Payer: Self-pay

## 2018-10-18 ENCOUNTER — Telehealth: Payer: Self-pay | Admitting: Cardiovascular Disease

## 2018-10-18 ENCOUNTER — Encounter: Payer: Self-pay | Admitting: Cardiovascular Disease

## 2018-10-18 ENCOUNTER — Ambulatory Visit (INDEPENDENT_AMBULATORY_CARE_PROVIDER_SITE_OTHER): Payer: Medicare Other | Admitting: Cardiovascular Disease

## 2018-10-18 VITALS — BP 130/74 | HR 68 | Ht 69.0 in | Wt 177.2 lb

## 2018-10-18 DIAGNOSIS — R0789 Other chest pain: Secondary | ICD-10-CM

## 2018-10-18 DIAGNOSIS — I341 Nonrheumatic mitral (valve) prolapse: Secondary | ICD-10-CM

## 2018-10-18 DIAGNOSIS — I5032 Chronic diastolic (congestive) heart failure: Secondary | ICD-10-CM

## 2018-10-18 DIAGNOSIS — R079 Chest pain, unspecified: Secondary | ICD-10-CM

## 2018-10-18 NOTE — Telephone Encounter (Signed)
  Patient was told by nurse today to see what her copay would be for the echocardiogram in office. She checked with insurance and it will cost her $120.00 out of pocket. The nurse told her there may be other options to get the echo done that could be cheaper for her. Please contact her.

## 2018-10-18 NOTE — Progress Notes (Signed)
Cardiology Office Note:    Date:  10/18/2018   ID:  Susan Nunez, DOB Mar 14, 1940, MRN [id number]  PCP:  Cari Caraway, MD  Cardiologist:  Latayna Ritchie  Electrophysiologist:  None   Referring MD: Cari Caraway, MD   Chief Complaint  Patient presents with  . Chest Pain  . Mitral Valve Prolapse     Aug. 7, 2020    Susan Nunez is a 78 y.o. female with a hx of chest pain and MVP .   We were asked to see her today for follow up of her MVP and atypical CP by Dr. Addison Lank.   Has some occasional CP . Has some CP which we have attributed to MVP.    Worse with caffiene and with stress.    Primarily at night.   More aware of it at night.   Has skipping  Does her own yard work, mowing,   Took down a split rail fence.   Dismantled a Building services engineer .  Does not walk but is very busy     Past Medical History:  Diagnosis Date  . Anxiety    when husband was ill and died  . Arthritis   . Chronic kidney disease    stage 3....renal insufficiency  . Complication of anesthesia    difficulty waking up, difficulty peeing, extreme nausea & vomiting  . Diverticulosis   . GERD (gastroesophageal reflux disease)    not on daily basis  . Hyperlipidemia   . Hypertension   . Mitral valve prolapse    with mitral regurgitation   . PONV (postoperative nausea and vomiting)     Past Surgical History:  Procedure Laterality Date  . ABDOMINAL HYSTERECTOMY    . BACK SURGERY     x 3  . CARDIAC CATHETERIZATION  02/21/2010   Est. EF at 65% -- mooth and normal coronary arteries -- Normal left ventricular systolic function.  We will continue with medical therapy.  I suspect her chest pain was due to her mitral valve prolapse syndrome --  Thayer Headings, M.D.   . FOOT SURGERY  07/12/00   right  . JOINT REPLACEMENT    . KNEE ARTHROSCOPY  06/14/2009  . LUMBAR SPINE SURGERY     lumbar spine fusion L5-S1 performed 20 years ago x 3  . TOTAL KNEE ARTHROPLASTY Right 11/04/2014   Procedure: RIGHT TOTAL  KNEE ARTHROPLASTY;  Surgeon: Newt Minion, MD;  Location: [facility];  Service: Orthopedics;  Laterality: Right;  . TOTAL VAGINAL HYSTERECTOMY  12/18/2001    Current Medications: Current Meds  Medication Sig  . aspirin 81 MG tablet Take 81 mg by mouth daily.    . B Complex Vitamins (VITAMIN B COMPLEX PO) Take 1 tablet by mouth daily.   . Cholecalciferol (VITAMIN D3) 2000 UNITS TABS Take 2,000 Units by mouth daily.  Marland Kitchen lisinopril (PRINIVIL,ZESTRIL) 5 MG tablet Take 5 mg by mouth daily.    Marland Kitchen LORazepam (ATIVAN) 0.5 MG tablet Take 0.5-1 tablets by mouth every 6 (six) hours as needed.  . Multiple Vitamins-Minerals (CENTRUM SILVER PO) Take 1 tablet by mouth daily.  . Multiple Vitamins-Minerals (EYE VITAMINS PO) Take 1 tablet by mouth daily.  Marland Kitchen omeprazole (PRILOSEC OTC) 20 MG tablet Take 20 mg by mouth daily as needed (for heartburn).      Allergies:   Citalopram, Ciprofloxacin hcl, Demerol, Flagyl [metronidazole], Lovastatin, Penicillins, and Sertraline hcl   Social History   Socioeconomic History  . Marital status: Widowed  Spouse name: Not on file  . Number of children: Not on file  . Years of education: Not on file  . Highest education level: Not on file  Occupational History  . Not on file  Social Needs  . Financial resource strain: Not on file  . Food insecurity    Worry: Not on file    Inability: Not on file  . Transportation needs    Medical: Not on file    Non-medical: Not on file  Tobacco Use  . Smoking status: Never Smoker  . Smokeless tobacco: Never Used  Substance and Sexual Activity  . Alcohol use: No  . Drug use: No  . Sexual activity: Not on file  Lifestyle  . Physical activity    Days per week: Not on file    Minutes per session: Not on file  . Stress: Not on file  Relationships  . Social Herbalist on phone: Not on file    Gets together: Not on file    Attends religious service: Not on file    Active member of club or organization: Not on file     Attends meetings of clubs or organizations: Not on file    Relationship status: Not on file  Other Topics Concern  . Not on file  Social History Narrative  . Not on file     Family History: The patient's family history includes Coronary artery disease in her father; Heart attack in her father; Hypertension in her mother.  ROS:   Please see the history of present illness.     All other systems reviewed and are negative.  EKGs/Labs/Other Studies Reviewed:    The following studies were reviewed today:   EKG:  Aug 7. 2020 :  NSR at 76.  Occasional PACs  Recent Labs: No results found for requested labs within last 8760 hours.  Recent Lipid Panel No results found for: CHOL, TRIG, HDL, CHOLHDL, VLDL, LDLCALC, LDLDIRECT  Physical Exam:    VS:  BP 130/74   Pulse 68   Ht 5\' 9"  (1.753 m)   Wt 177 lb 3.2 oz (80.4 kg)   SpO2 96%   BMI 26.17 kg/m     Wt Readings from Last 3 Encounters:  10/18/18 177 lb 3.2 oz (80.4 kg)  05/07/17 171 lb (77.6 kg)  11/04/14 163 lb 4 oz (74 kg)     GEN:  Well nourished, well developed in no acute distress HEENT: Normal NECK: No JVD; No carotid bruits LYMPHATICS: No lymphadenopathy CARDIAC: RRR, no murmurs, rubs, gallops RESPIRATORY:  Clear to auscultation without rales, wheezing or rhonchi  ABDOMEN: Soft, non-tender, non-distended MUSCULOSKELETAL:  No edema; No deformity  SKIN: Warm and dry NEUROLOGIC:  Alert and oriented x 3 PSYCHIATRIC:  Normal affect   ASSESSMENT:    1. Mitral valve prolapse   2. Atypical chest pain   3. Chronic diastolic CHF (congestive heart failure) (HCC)   4. Chest pain of uncertain etiology    PLAN:    In order of problems listed above:  1. Atypical chest pain: Overall [name] seems to be doing well.  She is remains quite active and has not had severe chest pains.  She does have a history of very mild mitral valve prolapse of the posterior leaflet.  Her last echocardiogram was 2009.  We will repeat the  echocardiogram.  She is also having some shortness of breath with exertion.  2.  Shortness of breath: She remains fairly active but  she does not do any regular aerobic exercise.  I encouraged her to remain active and to get regular exercise.  We get an echocardiogram for further evaluation of the shortness of breath.  She will continue to see her primary medical doctor.   Medication Adjustments/Labs and Tests Ordered: Current medicines are reviewed at length with the patient today.  Concerns regarding medicines are outlined above.  Orders Placed This Encounter  Procedures  . EKG 12-Lead  . ECHOCARDIOGRAM COMPLETE   No orders of the defined types were placed in this encounter.   Patient Instructions  Medication Instructions:  Your physician recommends that you continue on your current medications as directed. Please refer to the Current Medication list given to you today.  If you need a refill on your cardiac medications before your next appointment, please call your pharmacy.    Lab work: None Ordered    Testing/Procedures: Your physician has requested that you have an echocardiogram. Echocardiography is a painless test that uses sound waves to create images of your heart. It provides your doctor with information about the size and shape of your heart and how well your heart's chambers and valves are working. This procedure takes approximately one hour. There are no restrictions for this procedure.    Follow-Up: At [facility], you and your health needs are our priority.  As part of our continuing mission to provide you with exceptional heart care, we have created designated Provider Care Teams.  These Care Teams include your primary Cardiologist (physician) and Advanced Practice Providers (APPs -  Physician Assistants and Nurse Practitioners) who all work together to provide you with the care you need, when you need it. You will need a follow up appointment in:  1 years.  Please  call our office 2 months in advance to schedule this appointment.  You may see Mertie Moores, MD or one of the following Advanced Practice Providers on your designated Care Team: Richardson Dopp, PA-C [place], [place] . Daune Perch, NP      Signed, Mertie Moores, MD  10/18/2018 5:35 PM    [place]

## 2018-10-18 NOTE — Patient Instructions (Addendum)
Medication Instructions:  Your physician recommends that you continue on your current medications as directed. Please refer to the Current Medication list given to you today.  If you need a refill on your cardiac medications before your next appointment, please call your pharmacy.    Lab work: None Ordered    Testing/Procedures: Your physician has requested that you have an echocardiogram. Echocardiography is a painless test that uses sound waves to create images of your heart. It provides your doctor with information about the size and shape of your heart and how well your heart's chambers and valves are working. This procedure takes approximately one hour. There are no restrictions for this procedure.    Follow-Up: At [facility], you and your health needs are our priority.  As part of our continuing mission to provide you with exceptional heart care, we have created designated Provider Care Teams.  These Care Teams include your primary Cardiologist (physician) and Advanced Practice Providers (APPs -  Physician Assistants and Nurse Practitioners) who all work together to provide you with the care you need, when you need it. You will need a follow up appointment in:  1 years.  Please call our office 2 months in advance to schedule this appointment.  You may see Mertie Moores, MD or one of the following Advanced Practice Providers on your designated Care Team: Richardson Dopp, PA-C [place], [place] . Daune Perch, NP

## 2018-10-18 NOTE — Telephone Encounter (Signed)
Spoke with patient who states the echo at [facility] office will be approximately $120-125 dollars out of pocket and she would like to find out if it can be done at another location for less.  I advised that I can check with our [facility] office to find out if the test is any cheaper there. I advised I will call her back later with that information and she thanked me for the call.

## 2018-11-05 ENCOUNTER — Other Ambulatory Visit: Payer: Self-pay

## 2018-11-05 ENCOUNTER — Ambulatory Visit (HOSPITAL_COMMUNITY): Payer: Medicare Other | Attending: Cardiovascular Disease

## 2018-11-05 DIAGNOSIS — I5032 Chronic diastolic (congestive) heart failure: Secondary | ICD-10-CM | POA: Diagnosis present

## 2018-11-05 DIAGNOSIS — R0789 Other chest pain: Secondary | ICD-10-CM | POA: Diagnosis present

## 2018-11-05 DIAGNOSIS — I341 Nonrheumatic mitral (valve) prolapse: Secondary | ICD-10-CM | POA: Insufficient documentation

## 2018-12-09 ENCOUNTER — Ambulatory Visit (INDEPENDENT_AMBULATORY_CARE_PROVIDER_SITE_OTHER): Payer: Medicare Other | Admitting: Orthopedic Surgery

## 2018-12-09 ENCOUNTER — Encounter: Payer: Self-pay | Admitting: Orthopedic Surgery

## 2018-12-09 ENCOUNTER — Ambulatory Visit: Payer: Self-pay

## 2018-12-09 VITALS — Ht 69.0 in | Wt 177.0 lb

## 2018-12-09 DIAGNOSIS — M25571 Pain in right ankle and joints of right foot: Secondary | ICD-10-CM | POA: Diagnosis not present

## 2018-12-09 NOTE — Progress Notes (Signed)
Office Visit Note   Patient: Susan Nunez           Date of Birth: Aug 14, 1940           MRN: [id number] Visit Date: 12/09/2018              Requested by: Cari Caraway, [place],  [place] [address] PCP: Cari Caraway, MD  Chief Complaint  Patient presents with  . Right Ankle - Pain      HPI: Patient is a 78 year old woman who states that she rolled her right ankle when getting off the lawnmower.  She has been wearing compression socks for swelling she complains of pain with weightbearing she states ankle feels like it is giving way and is weak has increased swelling with increased activities.  Patient states she is also developed a blister over the dorsum of the third toe.  Assessment & Plan: Visit Diagnoses:  1. Pain in right ankle and joints of right foot     Plan: Recommended antibiotic ointment for the toe blister and avoid tight shoe wear.  We will place her in an ankle stabilizing orthosis for the ankle sprain also discussed the possibility of a fracture boot but patient states that she is too active to use the fracture boot.  Follow-Up Instructions: Return if symptoms worsen or fail to improve.   Ortho Exam  Patient is alert, oriented, no adenopathy, well-dressed, normal affect, normal respiratory effort. Examination patient has a good dorsalis pedis pulse.  She has good ankle good subtalar motion there is no ecchymosis no bruising no skin breakdown.  The syndesmosis is nontender to palpation or compression.  She is point tender to palpation over the anterior talofibular ligament.  Anterior drawer is stable.  Radiographs show no fractures.  Patient does have blistering over the dorsum of the third toe with redness and clear drainage.  Imaging: Xr Ankle 2 Views Right  Result Date: 12/09/2018 2 view radiographs of the right ankle shows no fractures no joint space subluxation or dislocation.  No images are attached to the encounter.  Labs: No  results found for: HGBA1C, ESRSEDRATE, CRP, LABURIC, REPTSTATUS, GRAMSTAIN, CULT, LABORGA   Lab Results  Component Value Date   ALBUMIN 4.0 10/23/2014   ALBUMIN 3.5 06/26/2010    No results found for: MG No results found for: VD25OH  No results found for: PREALBUMIN CBC EXTENDED Latest Ref Rng & Units 05/07/2017 10/23/2014 06/26/2010  WBC 4.0 - 10.5 K/uL 10.4 7.4 10.7(H)  RBC 3.87 - 5.11 MIL/uL 3.91 3.99 4.05  HGB 12.0 - 15.0 g/dL 12.3 13.1 12.8  HCT 36.0 - 46.0 % 38.9 39.5 39.2  PLT 150 - 400 K/uL 273 290 299  NEUTROABS 1.7 - 7.7 K/uL 8.2(H) - 7.9(H)  LYMPHSABS 0.7 - 4.0 K/uL 1.4 - 2.0     Body mass index is 26.14 kg/m.  Orders:  Orders Placed This Encounter  Procedures  . XR Ankle 2 Views Right   No orders of the defined types were placed in this encounter.    Procedures: No procedures performed  Clinical Data: No additional findings.  ROS:  All other systems negative, except as noted in the HPI. Review of Systems  Objective: Vital Signs: Ht 5\' 9"  (1.753 m)   Wt 177 lb (80.3 kg)   BMI 26.14 kg/m   Specialty Comments:  No specialty comments available.  PMFS History: Patient Active Problem List   Diagnosis Date Noted  . 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 Chest pain of uncertain etiology 99991111  . Hyperlipidemia 10/28/2010  . Mitral valve prolapse 10/28/2010   Past Medical History:  Diagnosis Date  . Anxiety    when husband was ill and died  . Arthritis   . Chronic kidney disease    stage 3....renal insufficiency  . Complication of anesthesia    difficulty waking up, difficulty peeing, extreme nausea & vomiting  . Diverticulosis   . GERD (gastroesophageal reflux disease)    not on daily basis  . Hyperlipidemia   . Hypertension   . Mitral valve prolapse    with mitral regurgitation   . PONV (postoperative nausea and vomiting)     Family History  Problem Relation Age of Onset  . Coronary artery disease Father   . Heart attack  Father   . Hypertension Mother     Past Surgical History:  Procedure Laterality Date  . ABDOMINAL HYSTERECTOMY    . BACK SURGERY     x 3  . CARDIAC CATHETERIZATION  02/21/2010   Est. EF at 65% -- mooth and normal coronary arteries -- Normal left ventricular systolic function.  We will continue with medical therapy.  I suspect her chest pain was due to her mitral valve prolapse syndrome --  Thayer Headings, M.D.   . FOOT SURGERY  07/12/00   right  . JOINT REPLACEMENT    . KNEE ARTHROSCOPY  06/14/2009  . LUMBAR SPINE SURGERY     lumbar spine fusion L5-S1 performed 20 years ago x 3  . TOTAL KNEE ARTHROPLASTY Right 11/04/2014   Procedure: RIGHT TOTAL KNEE ARTHROPLASTY;  Surgeon: Newt Minion, MD;  Location: [place];  Service: Orthopedics;  Laterality: Right;  . TOTAL VAGINAL HYSTERECTOMY  12/18/2001   Social History   Occupational History  . Not on file  Tobacco Use  . Smoking status: Never Smoker  . Smokeless tobacco: Never Used  Substance and Sexual Activity  . Alcohol use: No  . Drug use: No  . Sexual activity: Not on file

## 2019-03-26 IMAGING — CT CT HEAD W/O CM
3 series · 16 of 47 positions shown, 19 images · non-contrast
Comparison: None.

CLINICAL DATA: 76-year-old female with nausea, vomiting and
diarrhea. Vomiting and lost consciousness hitting left side of head.
Initial encounter.

EXAM:
CT HEAD WITHOUT CONTRAST
TECHNIQUE: Contiguous axial images were obtained from the base of the skull
through the vertex without intravenous contrast.

[Series 2: head trauma wo · axial · 0.41mm/px · z∈[-562,-437]mm · 10 of 30 slices shown, 13 images]
[im 3/30  brain]
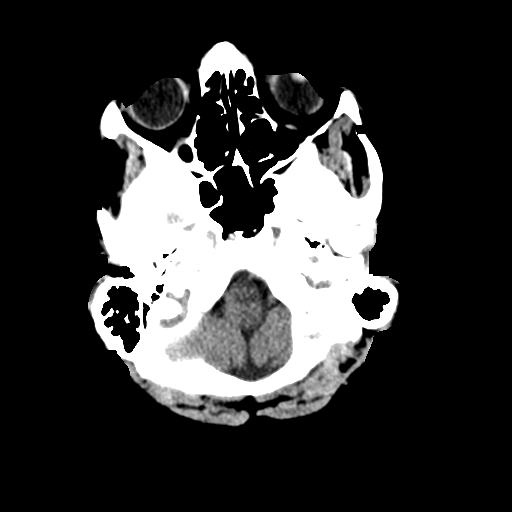
[im 3/30  bone]
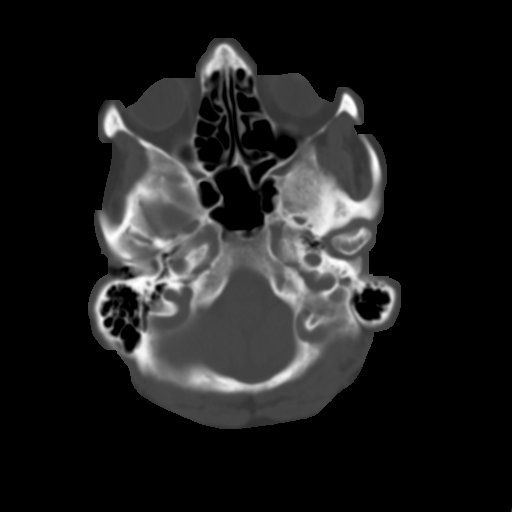
[im 6/30  brain]
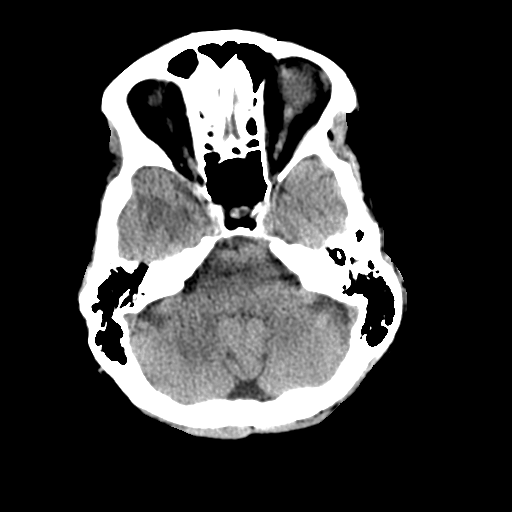
[im 9/30  brain]
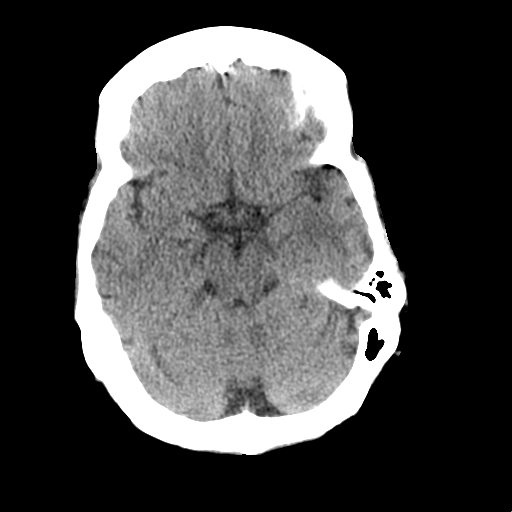
[im 11/30  brain]
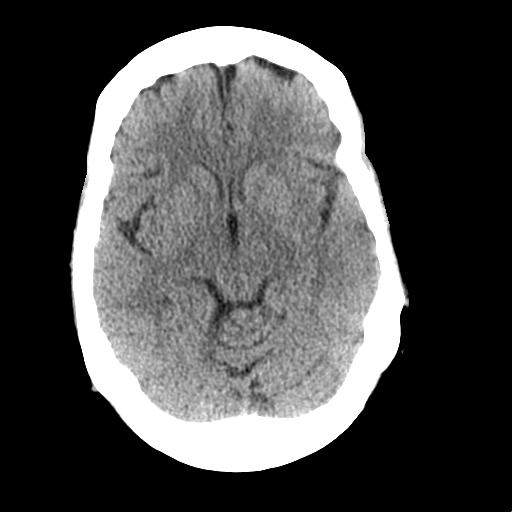
[im 14/30  brain]
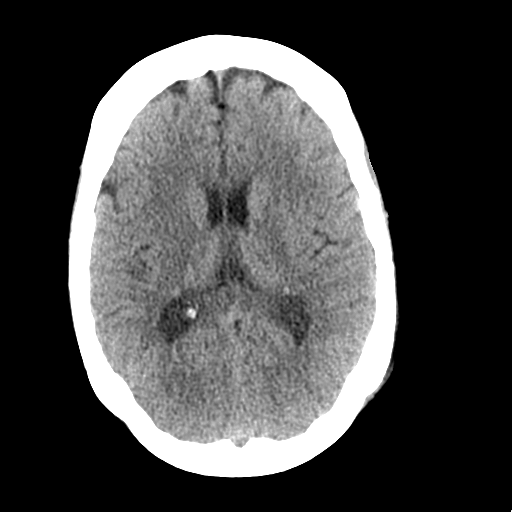
[im 14/30  bone]
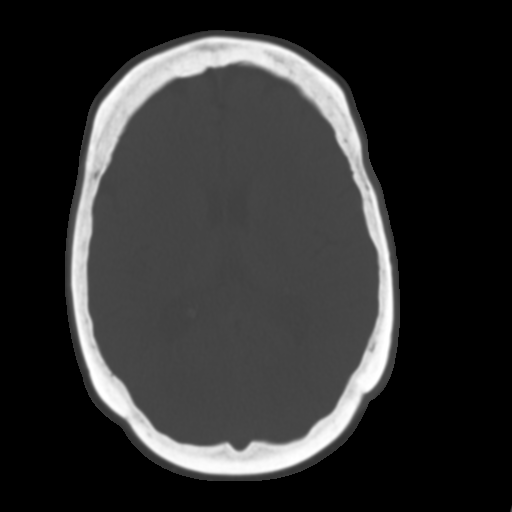
[im 17/30  brain]
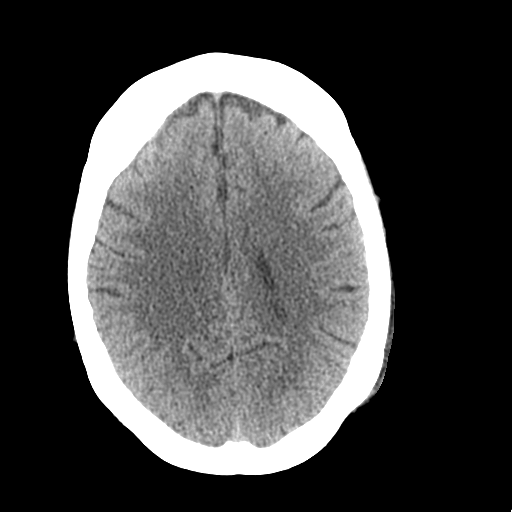
[im 20/30  brain]
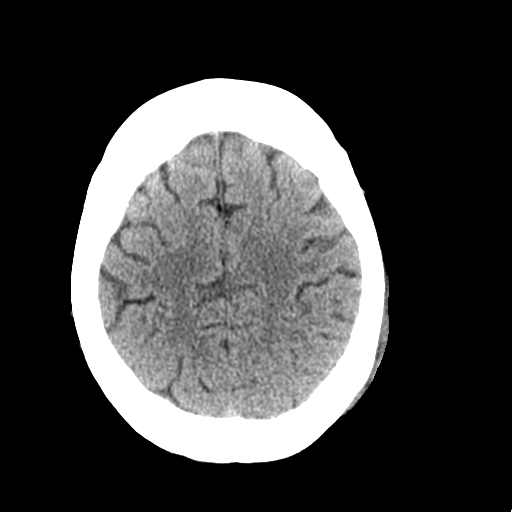
[im 23/30  brain]
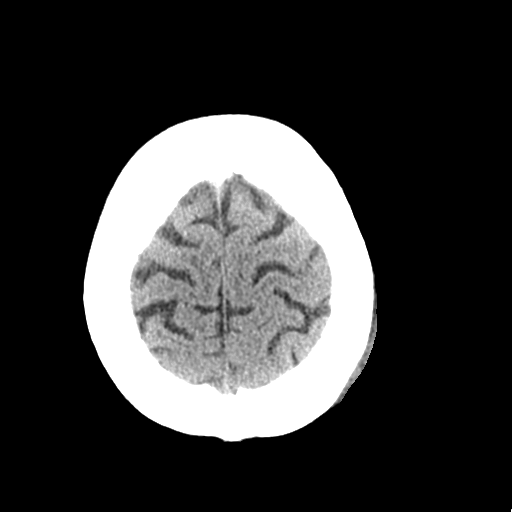
[im 25/30  brain]
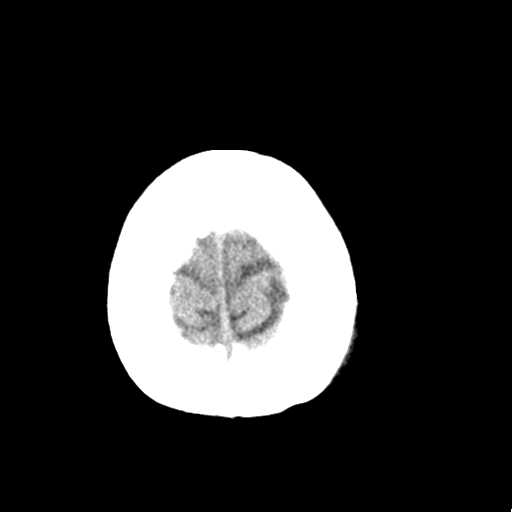
[im 25/30  bone]
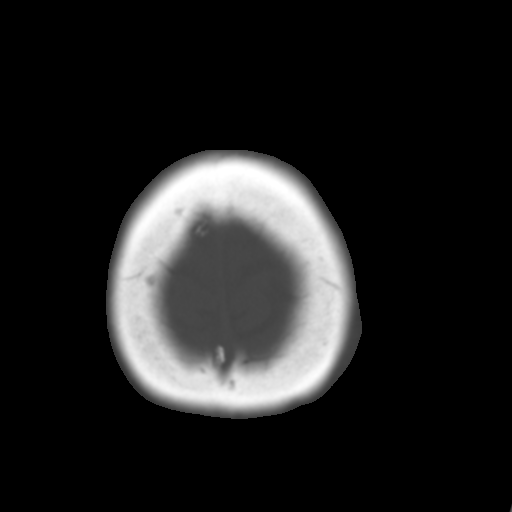
[im 28/30  brain]
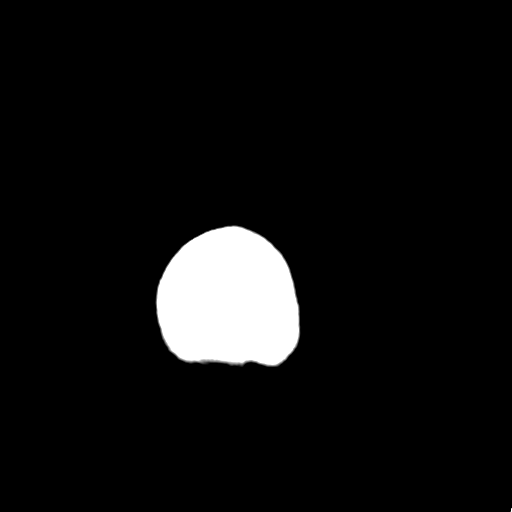

[Series 4: coronal soft tissue · coronal · 0.29mm/px · 3 of 65 slices shown]
[im 22/65  brain]
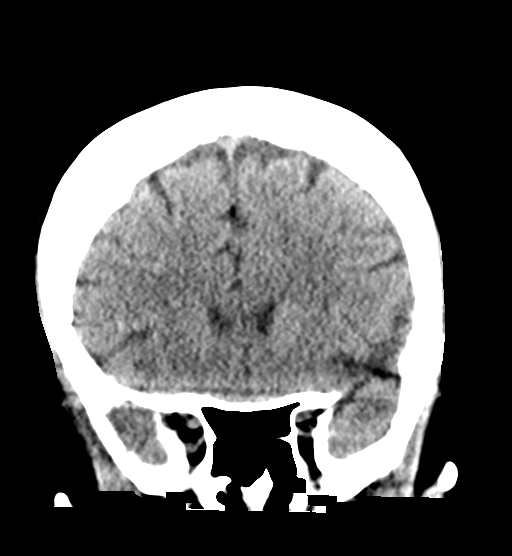
[im 29/65  brain]
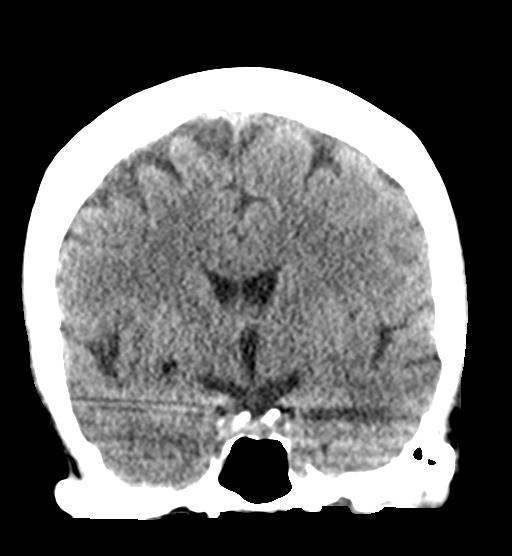
[im 36/65  brain]
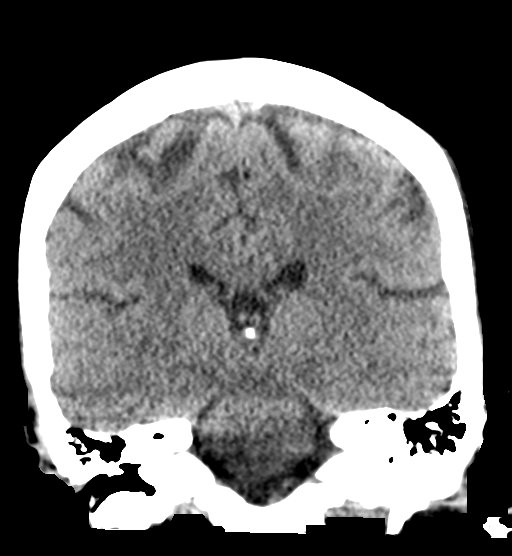

[Series 5: sagittal soft tissue · sagittal · 0.31mm/px · 3 of 51 slices shown]
[im 17/51  brain]
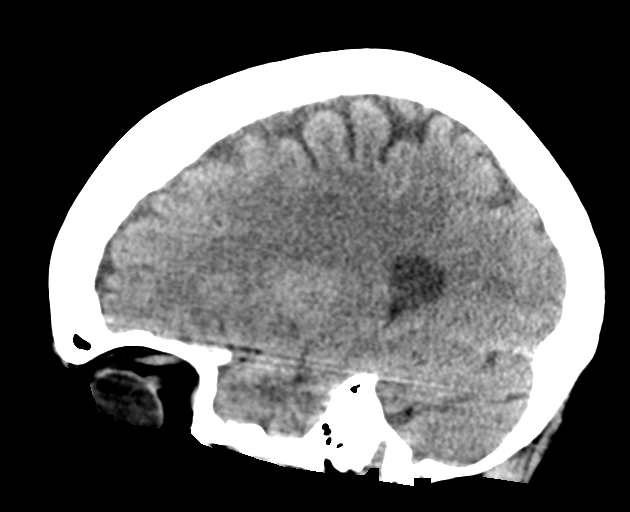
[im 26/51  brain]
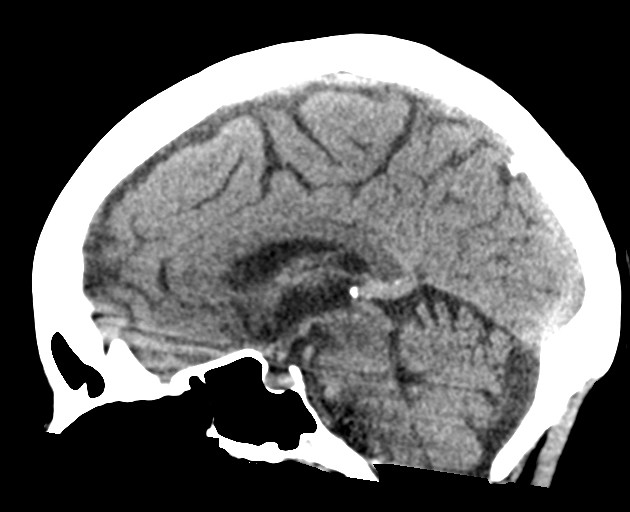
[im 34/51  brain]
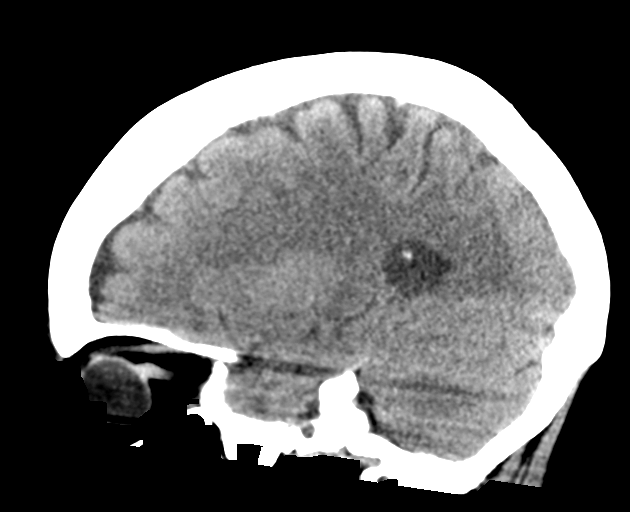

[16 of 47 positions shown; findings below may reference images not displayed]

FINDINGS: Brain: No intracranial hemorrhage or CT evidence of large acute
infarct.

No intracranial mass lesion noted on this unenhanced exam.

Vascular: No hyperdense vessel. Partial calcification carotid artery
cavernous segment bilaterally.

Skull: No skull fracture.

Sinuses/Orbits: No acute orbital abnormality. Visualized paranasal
sinuses clear. Mastoid air cells and middle ear cavities clear.

Other: Left parietal scalp hematoma.
IMPRESSION: Left parietal scalp hematoma without underlying fracture or
intracranial hemorrhage.

## 2019-05-06 ENCOUNTER — Other Ambulatory Visit (HOSPITAL_COMMUNITY): Payer: Self-pay | Admitting: Family Medicine

## 2019-05-06 DIAGNOSIS — Z1231 Encounter for screening mammogram for malignant neoplasm of breast: Secondary | ICD-10-CM

## 2019-05-15 ENCOUNTER — Other Ambulatory Visit (HOSPITAL_COMMUNITY): Payer: Self-pay | Admitting: Family Medicine

## 2019-05-15 DIAGNOSIS — M8588 Other specified disorders of bone density and structure, other site: Secondary | ICD-10-CM

## 2019-05-16 ENCOUNTER — Ambulatory Visit (HOSPITAL_COMMUNITY): Payer: Medicare Other

## 2019-06-02 ENCOUNTER — Ambulatory Visit (HOSPITAL_COMMUNITY)
Admission: RE | Admit: 2019-06-02 | Discharge: 2019-06-02 | Disposition: A | Discharge status: Home / Self Care | Payer: Medicare PPO | Source: Ambulatory Visit | Attending: Family Medicine | Admitting: Family Medicine

## 2019-06-02 ENCOUNTER — Other Ambulatory Visit: Payer: Self-pay

## 2019-06-02 DIAGNOSIS — M8588 Other specified disorders of bone density and structure, other site: Secondary | ICD-10-CM

## 2019-06-02 DIAGNOSIS — Z1231 Encounter for screening mammogram for malignant neoplasm of breast: Secondary | ICD-10-CM | POA: Diagnosis present

## 2019-08-01 DIAGNOSIS — E210 Primary hyperparathyroidism: Secondary | ICD-10-CM | POA: Diagnosis not present

## 2019-08-05 ENCOUNTER — Other Ambulatory Visit: Payer: Self-pay | Admitting: Surgery

## 2019-08-05 ENCOUNTER — Other Ambulatory Visit (HOSPITAL_COMMUNITY): Payer: Self-pay | Admitting: Surgery

## 2019-08-05 DIAGNOSIS — E213 Hyperparathyroidism, unspecified: Secondary | ICD-10-CM

## 2019-08-06 ENCOUNTER — Other Ambulatory Visit (HOSPITAL_COMMUNITY): Payer: Self-pay | Admitting: Surgery

## 2019-08-06 DIAGNOSIS — E210 Primary hyperparathyroidism: Secondary | ICD-10-CM

## 2019-08-15 ENCOUNTER — Ambulatory Visit (HOSPITAL_COMMUNITY)
Admission: RE | Admit: 2019-08-15 | Discharge: 2019-08-15 | Disposition: A | Discharge status: Home / Self Care | Payer: Medicare PPO | Source: Ambulatory Visit | Attending: Surgery | Admitting: Surgery

## 2019-08-15 ENCOUNTER — Other Ambulatory Visit: Payer: Self-pay

## 2019-08-15 ENCOUNTER — Encounter (HOSPITAL_COMMUNITY): Payer: Self-pay

## 2019-08-15 DIAGNOSIS — E210 Primary hyperparathyroidism: Secondary | ICD-10-CM | POA: Diagnosis not present

## 2019-08-15 DIAGNOSIS — E213 Hyperparathyroidism, unspecified: Secondary | ICD-10-CM

## 2019-08-15 DIAGNOSIS — E041 Nontoxic single thyroid nodule: Secondary | ICD-10-CM | POA: Diagnosis not present

## 2019-08-15 MED ORDER — TECHNETIUM TC 99M SESTAMIBI GENERIC - CARDIOLITE
25.0000 | Freq: Once | INTRAVENOUS | Status: AC | PRN
  Administered 2019-08-15: 25.4 via INTRAVENOUS

## 2019-08-26 DIAGNOSIS — D485 Neoplasm of uncertain behavior of skin: Secondary | ICD-10-CM | POA: Diagnosis not present

## 2019-08-26 DIAGNOSIS — L570 Actinic keratosis: Secondary | ICD-10-CM | POA: Diagnosis not present

## 2019-08-26 DIAGNOSIS — Z85828 Personal history of other malignant neoplasm of skin: Secondary | ICD-10-CM | POA: Diagnosis not present

## 2019-08-26 DIAGNOSIS — C44321 Squamous cell carcinoma of skin of nose: Secondary | ICD-10-CM | POA: Diagnosis not present

## 2019-08-27 ENCOUNTER — Other Ambulatory Visit (HOSPITAL_COMMUNITY): Payer: Self-pay | Admitting: Surgery

## 2019-08-27 ENCOUNTER — Other Ambulatory Visit: Payer: Self-pay | Admitting: Surgery

## 2019-08-27 DIAGNOSIS — E210 Primary hyperparathyroidism: Secondary | ICD-10-CM

## 2019-10-01 ENCOUNTER — Ambulatory Visit (HOSPITAL_COMMUNITY): Payer: Medicare PPO

## 2019-10-07 ENCOUNTER — Ambulatory Visit (HOSPITAL_COMMUNITY): Payer: Medicare PPO

## 2019-10-14 ENCOUNTER — Ambulatory Visit (HOSPITAL_COMMUNITY)
Admission: RE | Admit: 2019-10-14 | Discharge: 2019-10-14 | Disposition: A | Discharge status: Home / Self Care | Payer: Medicare PPO | Source: Ambulatory Visit | Attending: Surgery | Admitting: Surgery

## 2019-10-14 ENCOUNTER — Other Ambulatory Visit: Payer: Self-pay

## 2019-10-14 DIAGNOSIS — E210 Primary hyperparathyroidism: Secondary | ICD-10-CM | POA: Diagnosis not present

## 2019-10-14 DIAGNOSIS — I7090 Unspecified atherosclerosis: Secondary | ICD-10-CM | POA: Diagnosis not present

## 2019-10-14 DIAGNOSIS — I7789 Other specified disorders of arteries and arterioles: Secondary | ICD-10-CM | POA: Diagnosis not present

## 2019-10-14 DIAGNOSIS — I771 Stricture of artery: Secondary | ICD-10-CM | POA: Diagnosis not present

## 2019-10-14 DIAGNOSIS — I6523 Occlusion and stenosis of bilateral carotid arteries: Secondary | ICD-10-CM | POA: Diagnosis not present

## 2019-10-14 LAB — POCT I-STAT CREATININE: Creatinine, Ser: 1.4 mg/dL — ABNORMAL HIGH (ref 0.44–1.00)

## 2019-10-14 MED ORDER — IOHEXOL 300 MG/ML  SOLN
75.0000 mL | Freq: Once | INTRAMUSCULAR | Status: AC | PRN
  Administered 2019-10-14: 60 mL via INTRAVENOUS

## 2019-10-24 ENCOUNTER — Ambulatory Visit: Payer: Medicare PPO | Admitting: Cardiovascular Disease

## 2019-10-24 ENCOUNTER — Encounter: Payer: Self-pay | Admitting: Cardiovascular Disease

## 2019-10-24 ENCOUNTER — Other Ambulatory Visit: Payer: Self-pay

## 2019-10-24 VITALS — BP 126/76 | HR 67 | Ht 69.0 in | Wt 174.0 lb

## 2019-10-24 DIAGNOSIS — R0789 Other chest pain: Secondary | ICD-10-CM | POA: Diagnosis not present

## 2019-10-24 DIAGNOSIS — R079 Chest pain, unspecified: Secondary | ICD-10-CM | POA: Diagnosis not present

## 2019-10-24 DIAGNOSIS — I341 Nonrheumatic mitral (valve) prolapse: Secondary | ICD-10-CM

## 2019-10-24 MED ORDER — PROPRANOLOL HCL 10 MG PO TABS: 10.0000 mg | ORAL_TABLET | Freq: Four times a day (QID) | ORAL | 2 refills | Status: DC

## 2019-10-24 NOTE — Progress Notes (Signed)
Cardiology Office Note:    Date:  10/24/2019   ID:  Susan Nunez, DOB [birth year], MRN [id number]  PCP:  Cari Caraway, MD  Cardiologist:  Shanice Poznanski  Electrophysiologist:  None   Referring MD: Cari Caraway, MD   Chief Complaint  Patient presents with  . Mitral Valve Prolapse     Aug. 7, 2020    Susan Nunez is a 79 y.o. female with a hx of chest pain and MVP .   We were asked to see her today for follow up of her MVP and atypical CP by Dr. Addison Lank.   Has some occasional CP . Has some CP which we have attributed to MVP.    Worse with caffiene and with stress.    Primarily at night.   More aware of it at night.   Has skipping  Does her own yard work, mowing,   Took down a split rail fence.   Dismantled a Building services engineer .  Does not walk but is very busy   October 24, 2019:   [name] is seen today for follow-up of her mitral valve prolapse and atypical chest pain. She had a normal heart cath in 2011. Continues to have cp. previously took propranolol with good results  Pinching like cp with radiation sporatic  We discussed doing a coronary Ct angiogram  Her pains have been unchanged since I met her 10 years .    Past Medical History:  Diagnosis Date  . Anxiety    when husband was ill and died  . Arthritis   . Chronic kidney disease    stage 3....renal insufficiency  . Complication of anesthesia    difficulty waking up, difficulty peeing, extreme nausea & vomiting  . Diverticulosis   . GERD (gastroesophageal reflux disease)    not on daily basis  . Hyperlipidemia   . Hypertension   . Mitral valve prolapse    with mitral regurgitation   . PONV (postoperative nausea and vomiting)     Past Surgical History:  Procedure Laterality Date  . ABDOMINAL HYSTERECTOMY    . BACK SURGERY     x 3  . CARDIAC CATHETERIZATION  02/21/2010   Est. EF at 65% -- mooth and normal coronary arteries -- Normal left ventricular systolic function.  We will continue with medical  therapy.  I suspect her chest pain was due to her mitral valve prolapse syndrome --  Thayer Headings, M.D.   . FOOT SURGERY  07/12/00   right  . JOINT REPLACEMENT    . KNEE ARTHROSCOPY  06/14/2009  . LUMBAR SPINE SURGERY     lumbar spine fusion L5-S1 performed 20 years ago x 3  . TOTAL KNEE ARTHROPLASTY Right 11/04/2014   Procedure: RIGHT TOTAL KNEE ARTHROPLASTY;  Surgeon: Newt Minion, MD;  Location: [place];  Service: Orthopedics;  Laterality: Right;  . TOTAL VAGINAL HYSTERECTOMY  12/18/2001    Current Medications: Current Meds  Medication Sig  . aspirin 81 MG tablet Take 81 mg by mouth daily.    . B Complex Vitamins (VITAMIN B COMPLEX PO) Take 1 tablet by mouth daily.   . Cholecalciferol (VITAMIN D3) 2000 UNITS TABS Take 2,000 Units by mouth daily.  Marland Kitchen latanoprost (XALATAN) 0.005 % ophthalmic solution Place 1 drop into both eyes at bedtime.  Marland Kitchen lisinopril (PRINIVIL,ZESTRIL) 5 MG tablet Take 5 mg by mouth daily.    . Multiple Vitamins-Minerals (CENTRUM SILVER PO) Take 1 tablet by mouth daily.  . Multiple Vitamins-Minerals (EYE  VITAMINS PO) Take 1 tablet by mouth daily.  Marland Kitchen omeprazole (PRILOSEC OTC) 20 MG tablet Take 20 mg by mouth daily as needed (for heartburn).   . risedronate (ACTONEL) 150 MG tablet Take 1 tablet by mouth every 30 (thirty) days.  . [DISCONTINUED] LORazepam (ATIVAN) 0.5 MG tablet Take 0.5-1 tablets by mouth every 6 (six) hours as needed.     Allergies:   Citalopram, Ciprofloxacin hcl, Demerol, Flagyl [metronidazole], Lovastatin, Penicillins, and Sertraline hcl   Social History   Socioeconomic History  . Marital status: Widowed    Spouse name: Not on file  . Number of children: Not on file  . Years of education: Not on file  . Highest education level: Not on file  Occupational History  . Not on file  Tobacco Use  . Smoking status: Never Smoker  . Smokeless tobacco: Never Used  Substance and Sexual Activity  . Alcohol use: No  . Drug use: No  . Sexual  activity: Not on file  Other Topics Concern  . Not on file  Social History Narrative  . Not on file   Social Determinants of Health   Financial Resource Strain:   . Difficulty of Paying Living Expenses:   Food Insecurity:   . Worried About Charity fundraiser in the Last Year:   . Arboriculturist in the Last Year:   Transportation Needs:   . Film/video editor (Medical):   Marland Kitchen Lack of Transportation (Non-Medical):   Physical Activity:   . Days of Exercise per Week:   . Minutes of Exercise per Session:   Stress:   . Feeling of Stress :   Social Connections:   . Frequency of Communication with Friends and Family:   . Frequency of Social Gatherings with Friends and Family:   . Attends Religious Services:   . Active Member of Clubs or Organizations:   . Attends Archivist Meetings:   Marland Kitchen Marital Status:      Family History: The patient's family history includes Coronary artery disease in her father; Heart attack in her father; Hypertension in her mother.  ROS:   Please see the history of present illness.     All other systems reviewed and are negative.  EKGs/Labs/Other Studies Reviewed:    The following studies were reviewed today:   EKG:  Aug 7. 2020 :  NSR at 76.  Occasional PACs  Recent Labs: 10/14/2019: Creatinine, Ser 1.40  Recent Lipid Panel No results found for: CHOL, TRIG, HDL, CHOLHDL, VLDL, LDLCALC, LDLDIRECT  Physical Exam:    Physical Exam: Blood pressure 126/76, pulse 67, height _0  (1.753 m), weight 174 lb (78.9 kg), SpO2 98 %.  GEN:  Well nourished, well developed in no acute distress HEENT: Normal NECK: No JVD; No carotid bruits LYMPHATICS: No lymphadenopathy CARDIAC: RRR , soft systolic murmur  RESPIRATORY:  Clear to auscultation without rales, wheezing or rhonchi  ABDOMEN: Soft, non-tender, non-distended MUSCULOSKELETAL:  No edema; No deformity  SKIN: Warm and dry NEUROLOGIC:  Alert and oriented x 3  EKG: October 24, 2019: Normal  sinus rhythm at 67.  No ST or T wave changes.  ASSESSMENT:    1. Atypical chest pain   2. Chest pain of uncertain etiology    PLAN:    In order of problems listed above:  1. Atypical chest pain: Neville continues to have episodes of rather atypical chest pain.  The pain is described as a pinching-like sensation.  He seems to  radiate outward after the pinch.  She had a normal heart catheterization in 2011.  We discussed possibly doing a coronary CT angiogram but she would like to hold off.  She is now trying to work through some hypercalcemia issues.  Of asked her to give me a call if her symptoms change.  I suspect that these are due to her mitral valve prolapse.  We will give her propranolol 10 mg to take 4 times a day as needed for chest pain and palpitations.  2.  Shortness of breath:      Medication Adjustments/Labs and Tests Ordered: Current medicines are reviewed at length with the patient today.  Concerns regarding medicines are outlined above.  Orders Placed This Encounter  Procedures  . EKG 12-Lead   Meds ordered this encounter  Medications  . propranolol (INDERAL) 10 MG tablet    Sig: Take 1 tablet (10 mg total) by mouth 4 (four) times daily.    Dispense:  30 tablet    Refill:  2    Patient Instructions  Medication Instructions:  1) START PROPRANOLOL 10 mg four times daily AS NEEDED for palpitations *If you need a refill on your cardiac medications before your next appointment, please call your pharmacy*  Follow-Up: At [facility], you and your health needs are our priority.  As part of our continuing mission to provide you with exceptional heart care, we have created designated Provider Care Teams.  These Care Teams include your primary Cardiologist (physician) and Advanced Practice Providers (APPs -  Physician Assistants and Nurse Practitioners) who all work together to provide you with the care you need, when you need it. Your next appointment:   12 month(s) The  format for your next appointment:   In Person Provider:   You may see Mertie Moores, MD or one of the following Advanced Practice Providers on your designated Care Team:    Richardson Dopp, PA-C  [place]bie [place], [place]      Signed, Mertie Moores, MD  10/24/2019 5:43 PM    [place]

## 2019-10-24 NOTE — Patient Instructions (Signed)
Medication Instructions:  1) START PROPRANOLOL 10 mg four times daily AS NEEDED for palpitations *If you need a refill on your cardiac medications before your next appointment, please call your pharmacy*  Follow-Up: At [facility], you and your health needs are our priority.  As part of our continuing mission to provide you with exceptional heart care, we have created designated Provider Care Teams.  These Care Teams include your primary Cardiologist (physician) and Advanced Practice Providers (APPs -  Physician Assistants and Nurse Practitioners) who all work together to provide you with the care you need, when you need it. Your next appointment:   12 month(s) The format for your next appointment:   In Person Provider:   You may see Mertie Moores, MD or one of the following Advanced Practice Providers on your designated Care Team:    Richardson Dopp, PA-C  [place], [place]

## 2019-11-13 DIAGNOSIS — I10 Essential (primary) hypertension: Secondary | ICD-10-CM | POA: Diagnosis not present

## 2019-11-13 DIAGNOSIS — E0590 Thyrotoxicosis, unspecified without thyrotoxic crisis or storm: Secondary | ICD-10-CM | POA: Diagnosis not present

## 2019-11-13 DIAGNOSIS — K219 Gastro-esophageal reflux disease without esophagitis: Secondary | ICD-10-CM | POA: Diagnosis not present

## 2019-11-13 DIAGNOSIS — E213 Hyperparathyroidism, unspecified: Secondary | ICD-10-CM | POA: Diagnosis not present

## 2019-11-13 DIAGNOSIS — E782 Mixed hyperlipidemia: Secondary | ICD-10-CM | POA: Diagnosis not present

## 2019-11-13 DIAGNOSIS — N1831 Chronic kidney disease, stage 3a: Secondary | ICD-10-CM | POA: Diagnosis not present

## 2019-11-13 DIAGNOSIS — H40053 Ocular hypertension, bilateral: Secondary | ICD-10-CM | POA: Diagnosis not present

## 2019-11-13 DIAGNOSIS — M899 Disorder of bone, unspecified: Secondary | ICD-10-CM | POA: Diagnosis not present

## 2019-11-18 ENCOUNTER — Other Ambulatory Visit: Payer: Self-pay | Admitting: Family Medicine

## 2019-11-18 ENCOUNTER — Other Ambulatory Visit (HOSPITAL_COMMUNITY): Payer: Self-pay | Admitting: Family Medicine

## 2019-11-18 DIAGNOSIS — I10 Essential (primary) hypertension: Secondary | ICD-10-CM | POA: Diagnosis not present

## 2019-11-18 DIAGNOSIS — R197 Diarrhea, unspecified: Secondary | ICD-10-CM

## 2019-11-18 DIAGNOSIS — R002 Palpitations: Secondary | ICD-10-CM | POA: Diagnosis not present

## 2019-11-18 DIAGNOSIS — K219 Gastro-esophageal reflux disease without esophagitis: Secondary | ICD-10-CM | POA: Diagnosis not present

## 2019-11-18 DIAGNOSIS — Z23 Encounter for immunization: Secondary | ICD-10-CM | POA: Diagnosis not present

## 2019-11-18 DIAGNOSIS — E213 Hyperparathyroidism, unspecified: Secondary | ICD-10-CM | POA: Diagnosis not present

## 2019-11-18 DIAGNOSIS — N1831 Chronic kidney disease, stage 3a: Secondary | ICD-10-CM | POA: Diagnosis not present

## 2019-11-18 DIAGNOSIS — E782 Mixed hyperlipidemia: Secondary | ICD-10-CM | POA: Diagnosis not present

## 2019-11-25 DIAGNOSIS — H52223 Regular astigmatism, bilateral: Secondary | ICD-10-CM | POA: Diagnosis not present

## 2019-11-25 DIAGNOSIS — H353132 Nonexudative age-related macular degeneration, bilateral, intermediate dry stage: Secondary | ICD-10-CM | POA: Diagnosis not present

## 2019-11-25 DIAGNOSIS — H401111 Primary open-angle glaucoma, right eye, mild stage: Secondary | ICD-10-CM | POA: Diagnosis not present

## 2019-11-25 DIAGNOSIS — H5203 Hypermetropia, bilateral: Secondary | ICD-10-CM | POA: Diagnosis not present

## 2019-11-25 DIAGNOSIS — H2513 Age-related nuclear cataract, bilateral: Secondary | ICD-10-CM | POA: Diagnosis not present

## 2019-11-25 DIAGNOSIS — H35363 Drusen (degenerative) of macula, bilateral: Secondary | ICD-10-CM | POA: Diagnosis not present

## 2019-11-25 DIAGNOSIS — H524 Presbyopia: Secondary | ICD-10-CM | POA: Diagnosis not present

## 2019-11-25 DIAGNOSIS — H3530 Unspecified macular degeneration: Secondary | ICD-10-CM | POA: Diagnosis not present

## 2019-11-25 DIAGNOSIS — H401121 Primary open-angle glaucoma, left eye, mild stage: Secondary | ICD-10-CM | POA: Diagnosis not present

## 2019-12-01 ENCOUNTER — Other Ambulatory Visit: Payer: Self-pay

## 2019-12-01 ENCOUNTER — Ambulatory Visit (HOSPITAL_COMMUNITY)
Admission: RE | Admit: 2019-12-01 | Discharge: 2019-12-01 | Disposition: A | Discharge status: Home / Self Care | Payer: Medicare PPO | Source: Ambulatory Visit | Attending: Family Medicine | Admitting: Family Medicine

## 2019-12-01 DIAGNOSIS — R109 Unspecified abdominal pain: Secondary | ICD-10-CM | POA: Diagnosis not present

## 2019-12-01 DIAGNOSIS — R197 Diarrhea, unspecified: Secondary | ICD-10-CM | POA: Insufficient documentation

## 2019-12-01 DIAGNOSIS — K8020 Calculus of gallbladder without cholecystitis without obstruction: Secondary | ICD-10-CM | POA: Diagnosis not present

## 2019-12-03 ENCOUNTER — Ambulatory Visit (HOSPITAL_COMMUNITY): Payer: Medicare PPO

## 2019-12-10 DIAGNOSIS — E349 Endocrine disorder, unspecified: Secondary | ICD-10-CM | POA: Diagnosis not present

## 2019-12-10 DIAGNOSIS — K8010 Calculus of gallbladder with chronic cholecystitis without obstruction: Secondary | ICD-10-CM | POA: Diagnosis not present

## 2019-12-18 ENCOUNTER — Ambulatory Visit: Payer: Self-pay | Admitting: Surgery

## 2019-12-25 DIAGNOSIS — H2513 Age-related nuclear cataract, bilateral: Secondary | ICD-10-CM | POA: Diagnosis not present

## 2019-12-25 DIAGNOSIS — H524 Presbyopia: Secondary | ICD-10-CM | POA: Diagnosis not present

## 2019-12-25 DIAGNOSIS — H52223 Regular astigmatism, bilateral: Secondary | ICD-10-CM | POA: Diagnosis not present

## 2019-12-25 DIAGNOSIS — H5203 Hypermetropia, bilateral: Secondary | ICD-10-CM | POA: Diagnosis not present

## 2019-12-25 DIAGNOSIS — H353132 Nonexudative age-related macular degeneration, bilateral, intermediate dry stage: Secondary | ICD-10-CM | POA: Diagnosis not present

## 2020-01-06 NOTE — Patient Instructions (Addendum)
DUE TO COVID-19 ONLY ONE VISITOR IS ALLOWED TO COME WITH YOU AND STAY IN THE WAITING ROOM ONLY DURING PRE OP AND PROCEDURE DAY OF SURGERY. THE 1 VISITOR  MAY VISIT WITH YOU AFTER SURGERY IN YOUR PRIVATE ROOM DURING VISITING HOURS ONLY!  YOU NEED TO HAVE A COVID 19 TEST ON: 01/12/20 @ 10:00 am , THIS TEST MUST BE DONE BEFORE SURGERY,  COVID TESTING SITE [place] [address], IT IS ON THE RIGHT GOING OUT [address] APPROXIMATELY  2 MINUTES PAST [employer] ON THE RIGHT. ONCE YOUR COVID TEST IS COMPLETED,  PLEASE BEGIN THE QUARANTINE INSTRUCTIONS AS OUTLINED IN YOUR HANDOUT.                Susan Nunez    Your procedure is scheduled on: 01/15/20   Report to [facility] Main  Entrance   Report to admitting at: 8:15 AM     Call this number if you have problems the morning of surgery [phone]    Remember: Do not eat solid food :After Midnight. Clear liquids until: 7:45 am.  CLEAR LIQUID DIET   Foods Allowed                                                                     Foods Excluded  Coffee and tea, regular and decaf                             liquids that you cannot  Plain Jell-O any favor except red or purple                                           see through such as: Fruit ices (not with fruit pulp)                                     milk, soups, orange juice  Iced Popsicles                                    All solid food Carbonated beverages, regular and diet                                    Cranberry, grape and apple juices Sports drinks like Gatorade Lightly seasoned clear broth or consume(fat free) Sugar, honey syrup  Sample Menu Breakfast                                Lunch                                     Supper Cranberry juice  Beef broth                            Chicken broth Jell-O                                     Grape juice                           Apple juice Coffee or tea                         Jell-O                                      Popsicle                                                Coffee or tea                        Coffee or tea  _____________________________________________________________________   BRUSH YOUR TEETH MORNING OF SURGERY AND RINSE YOUR MOUTH OUT, NO CHEWING GUM CANDY OR MINTS.     Take these medicines the morning of surgery with A SIP OF WATER: propanolol as needed.Use eye drops as usual.                             You may not have any metal on your body including hair pins and              piercings  Do not wear jewelry, make-up, lotions, powders or perfumes, deodorant             Do not wear nail polish on your fingernails.  Do not shave  48 hours prior to surgery.          Do not bring valuables to the hospital. [place].  Contacts, dentures or bridgework may not be worn into surgery.  Leave suitcase in the car. After surgery it may be brought to your room.     Patients discharged the day of surgery will not be allowed to drive home. IF YOU ARE HAVING SURGERY AND GOING HOME THE SAME DAY, YOU MUST HAVE AN ADULT TO DRIVE YOU HOME AND BE WITH YOU FOR 24 HOURS. YOU MAY GO HOME BY TAXI OR UBER OR ORTHERWISE, BUT AN ADULT MUST ACCOMPANY YOU HOME AND STAY WITH YOU FOR 24 HOURS.  Name and phone number of your driver:  Special Instructions: N/A              Please read over the following fact sheets you were given: _____________________________________________________________________          [facility] - Preparing for Surgery Before surgery, you can play an important role.  Because skin is not sterile, your skin needs to be as free of germs as possible.  You can reduce the number of germs on your skin by washing  with CHG (chlorahexidine gluconate) soap before surgery.  CHG is an antiseptic cleaner which kills germs and bonds with the skin to continue killing germs even after  washing. Please DO NOT use if you have an allergy to CHG or antibacterial soaps.  If your skin becomes reddened/irritated stop using the CHG and inform your nurse when you arrive at Short Stay. Do not shave (including legs and underarms) for at least 48 hours prior to the first CHG shower.  You may shave your face/neck. Please follow these instructions carefully:  1.  Shower with CHG Soap the night before surgery and the  morning of Surgery.  2.  If you choose to wash your hair, wash your hair first as usual with your  normal  shampoo.  3.  After you shampoo, rinse your hair and body thoroughly to remove the  shampoo.                           4.  Use CHG as you would any other liquid soap.  You can apply chg directly  to the skin and wash                       Gently with a scrungie or clean washcloth.  5.  Apply the CHG Soap to your body ONLY FROM THE NECK DOWN.   Do not use on face/ open                           Wound or open sores. Avoid contact with eyes, ears mouth and genitals (private parts).                       Wash face,  Genitals (private parts) with your normal soap.             6.  Wash thoroughly, paying special attention to the area where your surgery  will be performed.  7.  Thoroughly rinse your body with warm water from the neck down.  8.  DO NOT shower/wash with your normal soap after using and rinsing off  the CHG Soap.                9.  Pat yourself dry with a clean towel.            10.  Wear clean pajamas.            11.  Place clean sheets on your bed the night of your first shower and do not  sleep with pets. Day of Surgery : Do not apply any lotions/deodorants the morning of surgery.  Please wear clean clothes to the hospital/surgery center.  FAILURE TO FOLLOW THESE INSTRUCTIONS MAY RESULT IN THE CANCELLATION OF YOUR SURGERY PATIENT SIGNATURE_________________________________  NURSE  SIGNATURE__________________________________  ________________________________________________________________________

## 2020-01-07 ENCOUNTER — Encounter (HOSPITAL_COMMUNITY)
Admission: RE | Admit: 2020-01-07 | Discharge: 2020-01-07 | Disposition: A | Discharge status: Home / Self Care | Payer: Medicare PPO | Source: Ambulatory Visit | Attending: Surgery | Admitting: Surgery

## 2020-01-07 ENCOUNTER — Encounter (HOSPITAL_COMMUNITY): Payer: Self-pay

## 2020-01-07 ENCOUNTER — Other Ambulatory Visit: Payer: Self-pay

## 2020-01-07 DIAGNOSIS — K811 Chronic cholecystitis: Secondary | ICD-10-CM | POA: Diagnosis not present

## 2020-01-07 DIAGNOSIS — I341 Nonrheumatic mitral (valve) prolapse: Secondary | ICD-10-CM | POA: Insufficient documentation

## 2020-01-07 DIAGNOSIS — I10 Essential (primary) hypertension: Secondary | ICD-10-CM | POA: Insufficient documentation

## 2020-01-07 DIAGNOSIS — Z01812 Encounter for preprocedural laboratory examination: Secondary | ICD-10-CM | POA: Insufficient documentation

## 2020-01-07 HISTORY — DX: Cardiac murmur, unspecified: R01.1

## 2020-01-07 HISTORY — DX: Malignant (primary) neoplasm, unspecified: C80.1

## 2020-01-07 LAB — BASIC METABOLIC PANEL
Anion gap: 8 (ref 5–15)
BUN: 22 mg/dL (ref 8–23)
CO2: 27 mmol/L (ref 22–32)
Calcium: 11 mg/dL — ABNORMAL HIGH (ref 8.9–10.3)
Chloride: 106 mmol/L (ref 98–111)
Creatinine, Ser: 1.22 mg/dL — ABNORMAL HIGH (ref 0.44–1.00)
GFR, Estimated: 46 mL/min — ABNORMAL LOW (ref >60)
Glucose, Bld: 88 mg/dL (ref 70–99)
Potassium: 4.7 mmol/L (ref 3.5–5.1)
Sodium: 141 mmol/L (ref 135–145)

## 2020-01-07 LAB — CBC
HCT: 38.5 % (ref 36.0–46.0)
Hemoglobin: 12.7 g/dL (ref 12.0–15.0)
MCH: 33.5 pg (ref 26.0–34.0)
MCHC: 33 g/dL (ref 30.0–36.0)
MCV: 101.6 fL — ABNORMAL HIGH (ref 80.0–100.0)
Platelets: 290 10*3/uL (ref 150–400)
RBC: 3.79 MIL/uL — ABNORMAL LOW (ref 3.87–5.11)
RDW: 13.3 % (ref 11.5–15.5)
WBC: 6.4 10*3/uL (ref 4.0–10.5)
nRBC: 0 % (ref 0.0–0.2)

## 2020-01-07 NOTE — Progress Notes (Addendum)
COVID Vaccine Completed: Yes Date COVID Vaccine completed: 05/05/19 COVID vaccine manufacturer:    Moderna   PCP - Dr. Cari Caraway  Cardiologist - Dr. Mertie Moores . LOV: 10/24/19  Chest x-ray -  EKG - 10/24/19 Stress Test -  ECHO - 11/05/18 Cardiac Cath - 2011 Pacemaker/ICD device last checked:  Sleep Study -  CPAP -   Fasting Blood Sugar -  Checks Blood Sugar _____ times a day  Blood Thinner Instructions: Aspirin Instructions: Last Dose:  Anesthesia review: Hx: HTN,Mitral valve prolapse,heart murmur..  Patient denies shortness of breath, fever, cough and chest pain at PAT appointment   Patient verbalized understanding of instructions that were given to them at the PAT appointment. Patient was also instructed that they will need to review over the PAT instructions again at home before surgery.

## 2020-01-08 NOTE — Progress Notes (Signed)
Anesthesia Chart Review:   Case: [id number] Date/Time: 01/15/20 1030   Procedure: LAPAROSCOPIC CHOLECYSTECTOMY WITH INTRAOPERATIVE CHOLANGIOGRAM (N/A )   Anesthesia type: General   Pre-op diagnosis: CHRONIC CHOLECYSTITIS   Location: [facility] ROOM 01 / [facility]   Surgeons: Armandina Gemma, MD      DISCUSSION:  Pt is a 79 year old with hx HTN, mitral valve prolapse (mitral valve structure normal, no MR on 11/05/18 echo)   VS: BP 135/71   Pulse 69   Temp 36.6 C (Oral)   Resp 18   Ht 5\' 9"  (1.753 m)   Wt 76 kg   SpO2 99%   BMI 24.74 kg/m    PROVIDERS: - PCP is Cari Caraway, MD  - Cardiologist is Mertie Moores, MD who sees pt for atypical chest pain x 10 years (normal cath 2011). Last office visit 10/24/19   LABS: Labs reviewed: Acceptable for surgery. (all labs ordered are listed, but only abnormal results are displayed)  Labs Reviewed  CBC - Abnormal; Notable for the following components:      Result Value   RBC 3.79 (*)    MCV 101.6 (*)    All other components within normal limits  BASIC METABOLIC PANEL - Abnormal; Notable for the following components:   Creatinine, Ser 1.22 (*)    Calcium 11.0 (*)    GFR, Estimated 46 (*)    All other components within normal limits    EKG 10/24/19: NSR   CV: Echo 11/05/18:  1. The left ventricle has normal systolic function with an ejection fraction of 60-65%. The cavity size was normal. There is mild concentric left ventricular hypertrophy. Left ventricular diastolic parameters were normal.  2. The right ventricle has normal systolic function. The cavity was normal. There is no increase in right ventricular wall thickness.  3. The aortic valve is tricuspid. Aortic valve regurgitation is trivial by color flow Doppler.    Past Medical History:  Diagnosis Date  . Anxiety    when husband was ill and died  . Arthritis   . 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 Chronic kidney disease    stage 3....renal insufficiency  .  Complication of anesthesia    difficulty waking up, difficulty peeing, extreme nausea & vomiting  . Diverticulosis   . GERD (gastroesophageal reflux disease)    not on daily basis  . Heart murmur   . Hyperlipidemia   . Hypertension   . Mitral valve prolapse    with mitral regurgitation   . PONV (postoperative nausea and vomiting)     Past Surgical History:  Procedure Laterality Date  . ABDOMINAL HYSTERECTOMY    . BACK SURGERY     x 3  . CARDIAC CATHETERIZATION  02/21/2010   Est. EF at 65% -- mooth and normal coronary arteries -- Normal left ventricular systolic function.  We will continue with medical therapy.  I suspect her chest pain was due to her mitral valve prolapse syndrome --  Thayer Headings, M.D.   . FOOT SURGERY  07/12/00   right  . JOINT REPLACEMENT    . KNEE ARTHROSCOPY  06/14/2009  . LUMBAR SPINE SURGERY     lumbar spine fusion L5-S1 performed 20 years ago x 3  . TOTAL KNEE ARTHROPLASTY Right 11/04/2014   Procedure: RIGHT TOTAL KNEE ARTHROPLASTY;  Surgeon: Newt Minion, MD;  Location: [facility];  Service: Orthopedics;  Laterality: Right;  . TOTAL VAGINAL HYSTERECTOMY  12/18/2001  MEDICATIONS: . acidophilus (RISAQUAD) CAPS capsule  . aspirin 81 MG tablet  . B Complex Vitamins (VITAMIN B COMPLEX PO)  . Cholecalciferol (VITAMIN D3) 2000 UNITS TABS  . latanoprost (XALATAN) 0.005 % ophthalmic solution  . lisinopril (PRINIVIL,ZESTRIL) 5 MG tablet  . Multiple Vitamins-Minerals (CENTRUM SILVER PO)  . Multiple Vitamins-Minerals (EYE VITAMINS PO)  . Omega-3 Fatty Acids (FISH OIL) 1200 MG CAPS  . omeprazole (PRILOSEC OTC) 20 MG tablet  . propranolol (INDERAL) 10 MG tablet  . risedronate (ACTONEL) 150 MG tablet  . vitamin B-12 (CYANOCOBALAMIN) 1000 MCG tablet   No current facility-administered medications for this encounter.    If no changes, I anticipate pt can proceed with surgery as scheduled.   Willeen Cass, PhD, FNP-BC [facility] Short Stay Surgical  Center/Anesthesiology Phone: [phone] 01/08/2020 2:55 PM

## 2020-01-08 NOTE — Anesthesia Preprocedure Evaluation (Addendum)
Anesthesia Evaluation  Patient identified by MRN, date of birth, ID band Patient awake    Reviewed: Allergy & Precautions, NPO status , Patient's Chart, lab work & pertinent test results  History of Anesthesia Complications (+) PONV and history of anesthetic complications  Airway Mallampati: II       Dental no notable dental hx. (+) Dental Advisory Given   Pulmonary neg pulmonary ROS,    breath sounds clear to auscultation       Cardiovascular hypertension, Pt. on medications + Valvular Problems/Murmurs MR and MVP  Rhythm:Regular Rate:Normal + Systolic murmurs    Neuro/Psych Anxiety negative neurological ROS     GI/Hepatic Neg liver ROS, GERD  Medicated,  Endo/Other  negative endocrine ROS  Renal/GU Renal InsufficiencyRenal disease     Musculoskeletal  (+) Arthritis ,   Abdominal Normal abdominal exam  (+)   Peds  Hematology negative hematology ROS (+)   Anesthesia Other Findings   Reproductive/Obstetrics                            Anesthesia Physical Anesthesia Plan  ASA: III  Anesthesia Plan: General   Post-op Pain Management:    Induction: Intravenous  PONV Risk Score and Plan: 4 or greater and Ondansetron, Dexamethasone, Propofol infusion, TIVA and Treatment may vary due to age or medical condition  Airway Management Planned: Oral ETT  Additional Equipment: None  Intra-op Plan:   Post-operative Plan: Extubation in OR  Informed Consent: I have reviewed the patients History and Physical, chart, labs and discussed the procedure including the risks, benefits and alternatives for the proposed anesthesia with the patient or authorized representative who has indicated his/her understanding and acceptance.     Dental advisory given  Plan Discussed with: CRNA  Anesthesia Plan Comments: (See APP note by Durel Salts, FNP   Amisulpride 10mg )      Anesthesia Quick  Evaluation

## 2020-01-11 ENCOUNTER — Encounter (HOSPITAL_COMMUNITY): Payer: Self-pay | Admitting: Surgery

## 2020-01-11 DIAGNOSIS — K8010 Calculus of gallbladder with chronic cholecystitis without obstruction: Secondary | ICD-10-CM | POA: Diagnosis present

## 2020-01-11 NOTE — H&P (Signed)
General Surgery [facility]  Mosie Epstein DOB: 1940/10/09 Widowed / Language: Cleophus Molt / Race: White Female   History of Present Illness   The patient is a 79 year old female who presents with primary hyperparathyroidism.  CHIEF COMPLAINT: primary hyperparathyroidism, symptomatic gallstones  Patient returns for follow-up regarding primary hyperparathyroidism and also with a new complaint today regarding symptomatic cholelithiasis.  Patient had undergone an extensive workup for evaluation of possible primary hyperparathyroidism. This has included an ultrasound examination of the neck, a nuclear medicine parathyroid scan, and a 4D CT scan of the neck. All of these studies have been unrevealing with no evidence of parathyroid adenoma. Her last 2 calcium levels when repeated measured 10.5 and 10.4 respectively. Her most recent PTH level was slightly elevated at 75. Patient does have osteoporosis and is on Actonel.  Patient presents today with a new complaint of nausea associated with meals. She complains of frequent loose stools. She has had occasional emesis. She complains of right back pain. Patient underwent an ultrasound examination on December 01, 2019. This demonstrates a 2.2 cm echogenic focus in the gallbladder either representing a large gallstone or several small stones grouped together. There is no biliary dilatation. Patient wishes to discuss cholecystectomy during her visit today.   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 Hysterectomy (not due to cancer) - Complete  Knee Surgery  Bilateral. Spinal Surgery - Lower Back   Diagnostic Studies History  Mammogram  within last year  Allergies  Sertraline HCl *CHEMICALS*  Penicillin G Benzathine *PENICILLINS*  Demerol *ANALGESICS - OPIOID*  Citalopram Hydrobromide *ANTIDEPRESSANTS*  Allergies Reconciled    Medication History  Latanoprost (0.005% Solution, Ophthalmic) Active. Risedronate Sodium (150MG  Tablet, Oral) Active. Aspirin (81MG  Tablet, Oral) Active. Lisinopril (5MG  Tablet, Oral) Active. Omeprazole (20MG  Capsule DR, Oral) Active.  Social History  Caffeine use  Carbonated beverages, Coffee. No alcohol use  No drug use  Tobacco use  Never smoker.  Family History  Arthritis  Mother. Cerebrovascular Accident  Father. Kidney Disease  Family Members In General. Respiratory Condition  Brother. Thyroid problems  Daughter, Sister.  Pregnancy / Birth History  Age at menarche  11 years. Age of menopause  53-55 Contraceptive History  Oral contraceptives. Gravida  2 Maternal age  77-30 Para  2  Other Problems  Anxiety Disorder  Arthritis  Back Pain  Diverticulosis  Gastroesophageal Reflux Disease  General anesthesia - complications  Heart murmur  Hemorrhoids  High blood pressure  Hypercholesterolemia  Thyroid Disease   Vitals  Weight: 171.6 lb Height: 70in Body Surface Area: 1.96 m Body Mass Index: 24.62 kg/m  Temp.: 97.20F (Tympanic)  Pulse: 116 (Regular)  BP: 112/72(Sitting, Left Arm, Standard)  Physical Exam   GENERAL APPEARANCE Development: normal Nutritional status: normal Gross deformities: none  SKIN Rash, lesions, ulcers: none Induration, erythema: none Nodules: none palpable  EYES Conjunctiva and lids: normal Pupils: equal and reactive Iris: normal bilaterally  EARS, NOSE, MOUTH, THROAT External ears: no lesion or deformity External nose: no lesion or deformity Hearing: grossly normal Due to Covid-19 pandemic, patient is wearing a mask.  NECK Symmetric: yes Trachea: midline Thyroid: no palpable nodules in the thyroid bed  CHEST Respiratory effort: normal Retraction or accessory muscle use: no Breath sounds: normal bilaterally Rales, rhonchi, wheeze: none  CARDIOVASCULAR Auscultation:  regular rhythm, normal rate Murmurs: none Pulses: radial pulse 2+ palpable Lower extremity edema: none  ABDOMEN Distension: none Masses: none  palpable Tenderness: none Hepatosplenomegaly: not present Hernia: not present  MUSCULOSKELETAL Station and gait: normal Digits and nails: no clubbing or cyanosis Muscle strength: grossly normal all extremities Range of motion: grossly normal all extremities Deformity: none  LYMPHATIC Cervical: none palpable Supraclavicular: none palpable  PSYCHIATRIC Oriented to person, place, and time: yes Mood and affect: normal for situation Judgment and insight: appropriate for situation    Assessment & Plan   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 At this point, her calcium remains below 11.0. I think it is safe to follow. Diagnostic studies have not localized a parathyroid adenoma and the patient does not wish to undergo a neck exploration at this time. I have recommended repeating her laboratory studies in 4 months. I will discuss this further with her primary care physician.  Patient does appear to have symptomatic cholelithiasis and probable chronic cholecystitis. We discussed cholecystectomy today. I gave her written literature to review at home. We discussed the procedure, the hospital stay, and the postoperative recovery. I will also discuss this with her primary care physician prior to posting her for surgery. Hopefully she will be able to have her procedure done later in October.  The risks and benefits of the procedure have been discussed at length with the patient. The patient understands the proposed procedure, potential alternative treatments, and the course of recovery to be expected. All of the patient's questions have been answered at this time. The patient wishes to proceed with surgery.  Armandina Gemma, MD [facility] Office: [phone]

## 2020-01-12 ENCOUNTER — Other Ambulatory Visit (HOSPITAL_COMMUNITY)
Admission: RE | Admit: 2020-01-12 | Discharge: 2020-01-12 | Disposition: A | Discharge status: Home / Self Care | Payer: Medicare PPO | Source: Ambulatory Visit | Attending: Surgery | Admitting: Surgery

## 2020-01-12 DIAGNOSIS — Z01818 Encounter for other preprocedural examination: Secondary | ICD-10-CM | POA: Diagnosis not present

## 2020-01-12 DIAGNOSIS — Z20822 Contact with and (suspected) exposure to covid-19: Secondary | ICD-10-CM | POA: Diagnosis not present

## 2020-01-12 LAB — SARS CORONAVIRUS 2 (TAT 6-24 HRS): SARS Coronavirus 2: NEGATIVE

## 2020-01-15 ENCOUNTER — Other Ambulatory Visit: Payer: Self-pay

## 2020-01-15 ENCOUNTER — Ambulatory Visit (HOSPITAL_COMMUNITY): Payer: Medicare PPO | Admitting: Emergency Medicine

## 2020-01-15 ENCOUNTER — Ambulatory Visit (HOSPITAL_COMMUNITY): Payer: Medicare PPO

## 2020-01-15 ENCOUNTER — Encounter (HOSPITAL_COMMUNITY): Payer: Self-pay | Admitting: Surgery

## 2020-01-15 ENCOUNTER — Encounter (HOSPITAL_COMMUNITY)
Admission: RE | Disposition: A | Discharge status: Home / Self Care | Payer: Self-pay | Source: Home / Self Care | Attending: Surgery

## 2020-01-15 ENCOUNTER — Ambulatory Visit (HOSPITAL_COMMUNITY): Payer: Medicare PPO | Admitting: Certified Registered Nurse Anesthetist

## 2020-01-15 ENCOUNTER — Ambulatory Visit (HOSPITAL_COMMUNITY)
Admission: RE | Admit: 2020-01-15 | Discharge: 2020-01-16 | Disposition: A | Discharge status: Home / Self Care | Payer: Medicare PPO | Attending: Surgery | Admitting: Surgery

## 2020-01-15 DIAGNOSIS — Z419 Encounter for procedure for purposes other than remedying health state, unspecified: Secondary | ICD-10-CM

## 2020-01-15 DIAGNOSIS — I341 Nonrheumatic mitral (valve) prolapse: Secondary | ICD-10-CM | POA: Diagnosis not present

## 2020-01-15 DIAGNOSIS — Z888 Allergy status to other drugs, medicaments and biological substances status: Secondary | ICD-10-CM | POA: Insufficient documentation

## 2020-01-15 DIAGNOSIS — Z880 Allergy status to penicillin: Secondary | ICD-10-CM | POA: Insufficient documentation

## 2020-01-15 DIAGNOSIS — Z885 Allergy status to narcotic agent status: Secondary | ICD-10-CM | POA: Diagnosis not present

## 2020-01-15 DIAGNOSIS — K219 Gastro-esophageal reflux disease without esophagitis: Secondary | ICD-10-CM | POA: Diagnosis not present

## 2020-01-15 DIAGNOSIS — K8010 Calculus of gallbladder with chronic cholecystitis without obstruction: Secondary | ICD-10-CM | POA: Diagnosis not present

## 2020-01-15 DIAGNOSIS — Z0389 Encounter for observation for other suspected diseases and conditions ruled out: Secondary | ICD-10-CM | POA: Diagnosis not present

## 2020-01-15 DIAGNOSIS — I10 Essential (primary) hypertension: Secondary | ICD-10-CM | POA: Diagnosis not present

## 2020-01-15 HISTORY — PX: CHOLECYSTECTOMY: SHX55

## 2020-01-15 SURGERY — LAPAROSCOPIC CHOLECYSTECTOMY WITH INTRAOPERATIVE CHOLANGIOGRAM
Anesthesia: General | Site: Abdomen

## 2020-01-15 MED ORDER — BUPIVACAINE-EPINEPHRINE 0.5% -1:200000 IJ SOLN
INTRAMUSCULAR | Status: DC | PRN
  Administered 2020-01-15: 30 mL

## 2020-01-15 MED ORDER — ORAL CARE MOUTH RINSE: 15.0000 mL | Freq: Once | OROMUCOSAL | Status: AC

## 2020-01-15 MED ORDER — ACETAMINOPHEN 160 MG/5ML PO SOLN: 325.0000 mg | Freq: Once | ORAL | Status: DC | PRN

## 2020-01-15 MED ORDER — CHLORHEXIDINE GLUCONATE CLOTH 2 % EX PADS: 6.0000 | MEDICATED_PAD | Freq: Once | CUTANEOUS | Status: DC

## 2020-01-15 MED ORDER — ONDANSETRON HCL 4 MG/2ML IJ SOLN
INTRAMUSCULAR | Status: DC | PRN
  Administered 2020-01-15: 4 mg via INTRAVENOUS

## 2020-01-15 MED ORDER — LACTATED RINGERS IV SOLN: INTRAVENOUS | Status: DC

## 2020-01-15 MED ORDER — FENTANYL CITRATE (PF) 100 MCG/2ML IJ SOLN
25.0000 ug | INTRAMUSCULAR | Status: DC | PRN
  Administered 2020-01-15: 50 ug via INTRAVENOUS

## 2020-01-15 MED ORDER — PROPOFOL 10 MG/ML IV BOLUS
INTRAVENOUS | Status: AC
  Filled 2020-01-15: qty 20

## 2020-01-15 MED ORDER — LACTATED RINGERS IR SOLN
Status: DC | PRN
  Administered 2020-01-15: 1000 mL

## 2020-01-15 MED ORDER — PROPOFOL 10 MG/ML IV BOLUS
INTRAVENOUS | Status: DC | PRN
  Administered 2020-01-15: 90 mg via INTRAVENOUS

## 2020-01-15 MED ORDER — OXYCODONE HCL 5 MG PO TABS
5.0000 mg | ORAL_TABLET | ORAL | Status: DC | PRN
  Administered 2020-01-15 – 2020-01-16 (×2): 5 mg via ORAL
  Filled 2020-01-15 (×2): qty 1

## 2020-01-15 MED ORDER — AMISULPRIDE (ANTIEMETIC) 5 MG/2ML IV SOLN
INTRAVENOUS | Status: AC
  Filled 2020-01-15: qty 4

## 2020-01-15 MED ORDER — BUPIVACAINE-EPINEPHRINE (PF) 0.5% -1:200000 IJ SOLN
INTRAMUSCULAR | Status: AC
  Filled 2020-01-15: qty 30

## 2020-01-15 MED ORDER — ROCURONIUM BROMIDE 10 MG/ML (PF) SYRINGE
PREFILLED_SYRINGE | INTRAVENOUS | Status: AC
  Filled 2020-01-15: qty 10

## 2020-01-15 MED ORDER — ONDANSETRON HCL 4 MG/2ML IJ SOLN
4.0000 mg | Freq: Four times a day (QID) | INTRAMUSCULAR | Status: DC | PRN
  Administered 2020-01-15 (×2): 4 mg via INTRAVENOUS
  Filled 2020-01-15 (×2): qty 2

## 2020-01-15 MED ORDER — 0.9 % SODIUM CHLORIDE (POUR BTL) OPTIME
TOPICAL | Status: DC | PRN
  Administered 2020-01-15: 1000 mL

## 2020-01-15 MED ORDER — CHLORHEXIDINE GLUCONATE 0.12 % MT SOLN
15.0000 mL | Freq: Once | OROMUCOSAL | Status: AC
  Administered 2020-01-15: 15 mL via OROMUCOSAL

## 2020-01-15 MED ORDER — MEPERIDINE HCL 50 MG/ML IJ SOLN: 6.2500 mg | INTRAMUSCULAR | Status: DC | PRN

## 2020-01-15 MED ORDER — PHENYLEPHRINE HCL-NACL 10-0.9 MG/250ML-% IV SOLN
INTRAVENOUS | Status: DC | PRN
  Administered 2020-01-15: 25 ug/min via INTRAVENOUS

## 2020-01-15 MED ORDER — AMISULPRIDE (ANTIEMETIC) 5 MG/2ML IV SOLN
10.0000 mg | Freq: Once | INTRAVENOUS | Status: AC
  Administered 2020-01-15 (×2): 5 mg via INTRAVENOUS

## 2020-01-15 MED ORDER — ONDANSETRON HCL 4 MG/2ML IJ SOLN
INTRAMUSCULAR | Status: AC
  Filled 2020-01-15: qty 2

## 2020-01-15 MED ORDER — HYDROMORPHONE HCL 1 MG/ML IJ SOLN: 1.0000 mg | INTRAMUSCULAR | Status: DC | PRN

## 2020-01-15 MED ORDER — LIDOCAINE 2% (20 MG/ML) 5 ML SYRINGE
INTRAMUSCULAR | Status: DC | PRN
  Administered 2020-01-15: 1.5 mg/kg/h via INTRAVENOUS

## 2020-01-15 MED ORDER — FENTANYL CITRATE (PF) 100 MCG/2ML IJ SOLN
INTRAMUSCULAR | Status: AC
  Filled 2020-01-15: qty 2

## 2020-01-15 MED ORDER — SODIUM CHLORIDE 0.45 % IV SOLN: INTRAVENOUS | Status: DC

## 2020-01-15 MED ORDER — ACETAMINOPHEN 650 MG RE SUPP: 650.0000 mg | Freq: Four times a day (QID) | RECTAL | Status: DC | PRN

## 2020-01-15 MED ORDER — CEFAZOLIN SODIUM-DEXTROSE 2-4 GM/100ML-% IV SOLN
2.0000 g | INTRAVENOUS | Status: AC
  Administered 2020-01-15: 2 g via INTRAVENOUS
  Filled 2020-01-15: qty 100

## 2020-01-15 MED ORDER — PROPOFOL 500 MG/50ML IV EMUL
INTRAVENOUS | Status: DC | PRN
  Administered 2020-01-15: 150 ug/kg/min via INTRAVENOUS

## 2020-01-15 MED ORDER — FENTANYL CITRATE (PF) 100 MCG/2ML IJ SOLN
INTRAMUSCULAR | Status: DC | PRN
  Administered 2020-01-15 (×3): 50 ug via INTRAVENOUS

## 2020-01-15 MED ORDER — DEXAMETHASONE SODIUM PHOSPHATE 10 MG/ML IJ SOLN
INTRAMUSCULAR | Status: DC | PRN
  Administered 2020-01-15: 6 mg via INTRAVENOUS

## 2020-01-15 MED ORDER — ACETAMINOPHEN 325 MG PO TABS: 650.0000 mg | ORAL_TABLET | Freq: Four times a day (QID) | ORAL | Status: DC | PRN

## 2020-01-15 MED ORDER — SUGAMMADEX SODIUM 200 MG/2ML IV SOLN
INTRAVENOUS | Status: DC | PRN
  Administered 2020-01-15: 152 mg via INTRAVENOUS

## 2020-01-15 MED ORDER — ACETAMINOPHEN 10 MG/ML IV SOLN
INTRAVENOUS | Status: AC
  Filled 2020-01-15: qty 100

## 2020-01-15 MED ORDER — LIDOCAINE 2% (20 MG/ML) 5 ML SYRINGE
INTRAMUSCULAR | Status: DC | PRN
  Administered 2020-01-15: 60 mg via INTRAVENOUS

## 2020-01-15 MED ORDER — TRAMADOL HCL 50 MG PO TABS: 50.0000 mg | ORAL_TABLET | Freq: Four times a day (QID) | ORAL | Status: DC | PRN

## 2020-01-15 MED ORDER — ROCURONIUM BROMIDE 10 MG/ML (PF) SYRINGE
PREFILLED_SYRINGE | INTRAVENOUS | Status: DC | PRN
  Administered 2020-01-15: 50 mg via INTRAVENOUS

## 2020-01-15 MED ORDER — IOHEXOL 300 MG/ML  SOLN
INTRAMUSCULAR | Status: DC | PRN
  Administered 2020-01-15: 6.5 mL

## 2020-01-15 MED ORDER — AMISULPRIDE (ANTIEMETIC) 5 MG/2ML IV SOLN
10.0000 mg | Freq: Once | INTRAVENOUS | Status: AC | PRN
  Administered 2020-01-15: 10 mg via INTRAVENOUS

## 2020-01-15 MED ORDER — FENTANYL CITRATE (PF) 250 MCG/5ML IJ SOLN
INTRAMUSCULAR | Status: AC
  Filled 2020-01-15: qty 5

## 2020-01-15 MED ORDER — ACETAMINOPHEN 10 MG/ML IV SOLN: 1000.0000 mg | Freq: Once | INTRAVENOUS | Status: DC | PRN

## 2020-01-15 MED ORDER — LIDOCAINE HCL 2 % IJ SOLN
INTRAMUSCULAR | Status: AC
  Filled 2020-01-15: qty 20

## 2020-01-15 MED ORDER — LATANOPROST 0.005 % OP SOLN
1.0000 [drp] | Freq: Every day | OPHTHALMIC | Status: DC
  Administered 2020-01-15: 1 [drp] via OPHTHALMIC
  Filled 2020-01-15: qty 2.5

## 2020-01-15 MED ORDER — LIDOCAINE 2% (20 MG/ML) 5 ML SYRINGE
INTRAMUSCULAR | Status: AC
  Filled 2020-01-15: qty 5

## 2020-01-15 MED ORDER — ONDANSETRON 4 MG PO TBDP: 4.0000 mg | ORAL_TABLET | Freq: Four times a day (QID) | ORAL | Status: DC | PRN

## 2020-01-15 MED ORDER — LISINOPRIL 5 MG PO TABS
5.0000 mg | ORAL_TABLET | Freq: Every day | ORAL | Status: DC
  Administered 2020-01-15: 16:00:00 5 mg via ORAL
  Filled 2020-01-15: qty 1

## 2020-01-15 MED ORDER — ACETAMINOPHEN 325 MG PO TABS: 325.0000 mg | ORAL_TABLET | Freq: Once | ORAL | Status: DC | PRN

## 2020-01-15 MED ORDER — PROPOFOL 1000 MG/100ML IV EMUL
INTRAVENOUS | Status: AC
  Filled 2020-01-15: qty 100

## 2020-01-15 SURGICAL SUPPLY — 35 items
APPLIER CLIP ROT 10 11.4 M/L (STAPLE) ×3
CABLE HIGH FREQUENCY MONO STRZ (ELECTRODE) ×3 IMPLANT
CHLORAPREP W/TINT 26 (MISCELLANEOUS) ×6 IMPLANT
CLIP APPLIE ROT 10 11.4 M/L (STAPLE) ×1 IMPLANT
CLOSURE WOUND 1/2 X4 (GAUZE/BANDAGES/DRESSINGS)
COVER MAYO STAND STRL (DRAPES) ×3 IMPLANT
COVER SURGICAL LIGHT HANDLE (MISCELLANEOUS) ×3 IMPLANT
COVER WAND RF STERILE (DRAPES) IMPLANT
DECANTER SPIKE VIAL GLASS SM (MISCELLANEOUS) IMPLANT
DERMABOND ADVANCED (GAUZE/BANDAGES/DRESSINGS)
DERMABOND ADVANCED .7 DNX12 (GAUZE/BANDAGES/DRESSINGS) IMPLANT
DRAPE C-ARM 42X120 X-RAY (DRAPES) ×3 IMPLANT
ELECT REM PT RETURN 15FT ADLT (MISCELLANEOUS) ×3 IMPLANT
GAUZE SPONGE 2X2 8PLY STRL LF (GAUZE/BANDAGES/DRESSINGS) ×1 IMPLANT
GLOVE SURG ORTHO 8.0 STRL STRW (GLOVE) ×3 IMPLANT
GOWN STRL REUS W/TWL XL LVL3 (GOWN DISPOSABLE) ×6 IMPLANT
HEMOSTAT SURGICEL 4X8 (HEMOSTASIS) IMPLANT
KIT BASIN OR (CUSTOM PROCEDURE TRAY) ×3 IMPLANT
KIT TURNOVER KIT A (KITS) ×3 IMPLANT
PENCIL SMOKE EVACUATOR (MISCELLANEOUS) IMPLANT
POUCH SPECIMEN RETRIEVAL 10MM (ENDOMECHANICALS) ×3 IMPLANT
SCISSORS LAP 5X35 DISP (ENDOMECHANICALS) ×3 IMPLANT
SET CHOLANGIOGRAPH MIX (MISCELLANEOUS) ×3 IMPLANT
SET IRRIG TUBING LAPAROSCOPIC (IRRIGATION / IRRIGATOR) ×3 IMPLANT
SET TUBE SMOKE EVAC HIGH FLOW (TUBING) ×3 IMPLANT
SLEEVE XCEL OPT CAN 5 100 (ENDOMECHANICALS) ×3 IMPLANT
SPONGE GAUZE 2X2 STER 10/PKG (GAUZE/BANDAGES/DRESSINGS) ×2
STRIP CLOSURE SKIN 1/2X4 (GAUZE/BANDAGES/DRESSINGS) IMPLANT
SUT MNCRL AB 4-0 PS2 18 (SUTURE) ×3 IMPLANT
TOWEL OR 17X26 10 PK STRL BLUE (TOWEL DISPOSABLE) ×3 IMPLANT
TOWEL OR NON WOVEN STRL DISP B (DISPOSABLE) ×3 IMPLANT
TRAY LAPAROSCOPIC (CUSTOM PROCEDURE TRAY) ×3 IMPLANT
TROCAR BLADELESS OPT 5 100 (ENDOMECHANICALS) ×3 IMPLANT
TROCAR XCEL BLUNT TIP 100MML (ENDOMECHANICALS) ×3 IMPLANT
TROCAR XCEL NON-BLD 11X100MML (ENDOMECHANICALS) ×3 IMPLANT

## 2020-01-15 NOTE — Plan of Care (Signed)
  Problem: Activity: Goal: Risk for activity intolerance will decrease Outcome: Progressing   Problem: Education: Goal: Knowledge of General Education information will improve Description: Including pain rating scale, medication(s)/side effects and non-pharmacologic comfort measures Outcome: Progressing   Problem: Health Behavior/Discharge Planning: Goal: Ability to manage health-related needs will improve Outcome: Progressing   Problem: Activity: Goal: Risk for activity intolerance will decrease Outcome: Progressing   Problem: Nutrition: Goal: Adequate nutrition will be maintained Outcome: Progressing   Problem: Safety: Goal: Ability to remain free from injury will improve Outcome: Progressing

## 2020-01-15 NOTE — Interval H&P Note (Signed)
History and Physical Interval Note:  01/15/2020 10:50 AM  Susan Nunez  has presented today for surgery, with the diagnosis of CHRONIC CHOLECYSTITIS.  The various methods of treatment have been discussed with the patient and family. After consideration of risks, benefits and other options for treatment, the patient has consented to    Procedure(s): LAPAROSCOPIC CHOLECYSTECTOMY WITH INTRAOPERATIVE CHOLANGIOGRAM (N/A) as a surgical intervention.    The patient's history has been reviewed, patient examined, no change in status, stable for surgery.  I have reviewed the patient's chart and labs.  Questions were answered to the patient's satisfaction.    Armandina Gemma, MD [facility] Office: [place]

## 2020-01-15 NOTE — Plan of Care (Signed)
Discussed with patient and daughter about plan of care for post-op day 0.  Educated patient about nausea control, pain control, and diet.    Will continue to monitor patient.    [place], Therapist, sports

## 2020-01-15 NOTE — Transfer of Care (Signed)
Immediate Anesthesia Transfer of Care Note  Patient: Susan Nunez  Procedure(s) Performed: LAPAROSCOPIC CHOLECYSTECTOMY WITH INTRAOPERATIVE CHOLANGIOGRAM (N/A Abdomen)  Patient Location: PACU  Anesthesia Type:General  Level of Consciousness: drowsy and patient cooperative  Airway & Oxygen Therapy: Patient Spontanous Breathing and Patient connected to face mask oxygen  Post-op Assessment: Report given to RN and Post -op Vital signs reviewed and stable  Post vital signs: Reviewed and stable  Last Vitals:  Vitals Value Taken Time  BP 112/63 01/15/20 1227  Temp    Pulse 83 01/15/20 1229  Resp 22 01/15/20 1230  SpO2 100 % 01/15/20 1229  Vitals shown include unvalidated device data.  Last Pain:  Vitals:   01/15/20 0853  TempSrc:   PainSc: 0-No pain         Complications: No complications documented.

## 2020-01-15 NOTE — Op Note (Signed)
Procedure Note  Pre-operative Diagnosis:  Chronic cholecystitis, cholelithiasis  Post-operative Diagnosis:  same  Surgeon:  Armandina Gemma, MD  Assistant:  none   Procedure:  Laparoscopic cholecystectomy with intra-operative cholangiography  Anesthesia:  General  Estimated Blood Loss:  minimal  Drains: none         Specimen: gallbladder to pathology  Indications:  Patient presents today with a new complaint of nausea associated with meals. She complains of frequent loose stools. She has had occasional emesis. She complains of right back pain. Patient underwent an ultrasound examination on December 01, 2019. This demonstrates a 2.2 cm echogenic focus in the gallbladder either representing a large gallstone or several small stones grouped together. There is no biliary dilatation. Patient wishes to proceed with cholecystectomy.  Procedure Details:  The patient was seen in the pre-op holding area. The risks, benefits, complications, treatment options, and expected outcomes were previously discussed with the patient. The patient agreed with the proposed plan and has signed the informed consent form.  The patient was transported to operating room #1 at the [facility]. The patient was placed in the supine position on the operating room table. Following induction of general anesthesia, the abdomen was prepped and draped in the usual aseptic fashion.  An incision was made in the skin near the umbilicus. The midline fascia was incised and the peritoneal cavity was entered and a Hasson cannula was introduced under direct vision. The cannula was secured with a 0-Vicryl pursestring suture. Pneumoperitoneum was established with carbon dioxide. Additional cannulae were introduced under direct vision along the right costal margin in the midline, mid-clavicular line, and anterior axillary line.   The gallbladder was identified and the fundus grasped and retracted cephalad. Adhesions were  taken down bluntly and the electrocautery was utilized as needed, taking care not to involve any adjacent structures. The infundibulum was grasped and retracted laterally, exposing the peritoneum overlying the triangle of Calot. The peritoneum was incised and structures exposed with blunt dissection. The cystic duct was clearly identified, bluntly dissected circumferentially, and clipped at the neck of the gallbladder.  An incision was made in the cystic duct and the cholangiogram catheter introduced. The catheter was secured using an ligaclip.  Real-time cholangiography was performed using C-arm fluoroscopy.  There was rapid filling of a normal caliber common bile duct.  There was reflux of contrast into the left and right hepatic ductal systems.  There was free flow distally into the duodenum without filling defect or obstruction.  The catheter was removed from the peritoneal cavity.  The cystic duct was then ligated with ligaclips and divided. The cystic artery was identified, dissected circumferentially, ligated with ligaclips, and divided.  The gallbladder was dissected away from the gallbladder bed using the electrocautery for hemostasis. The gallbladder was completely removed from the liver and placed into an endocatch bag. The gallbladder was removed in the endocatch bag through the umbilical port site and submitted to pathology for review.  The right upper quadrant was irrigated and the gallbladder bed was inspected. Hemostasis was achieved with the electrocautery.  Cannulae were removed under direct vision and good hemostasis was noted. Pneumoperitoneum was released and the majority of the carbon dioxide evacuated. The umbilical wound was irrigated and the fascia was then closed with the pursestring suture.  Local anesthetic was infiltrated at all port sites. Skin incisions were closed with 4-0 Monocril subcuticular sutures and Dermabond was applied.  Instrument, sponge, and needle counts were  correct at the conclusion of  the case.  The patient was awakened from anesthesia and brought to the recovery room in stable condition.  The patient tolerated the procedure well.   Armandina Gemma, MD [facility] Office: [phone]

## 2020-01-15 NOTE — Anesthesia Procedure Notes (Signed)
Procedure Name: Intubation Date/Time: 01/15/2020 11:08 AM Performed by: Montel Clock, CRNA Pre-anesthesia Checklist: Patient identified, Emergency Drugs available, Suction available, Patient being monitored and Timeout performed Patient Re-evaluated:Patient Re-evaluated prior to induction Oxygen Delivery Method: Circle system utilized Preoxygenation: Pre-oxygenation with 100% oxygen Induction Type: IV induction Ventilation: Mask ventilation without difficulty Laryngoscope Size: Mac and 3 Grade View: Grade I Tube type: Oral Tube size: 7.0 mm Number of attempts: 1 Airway Equipment and Method: Stylet Placement Confirmation: ETT inserted through vocal cords under direct vision,  positive ETCO2 and breath sounds checked- equal and bilateral Secured at: 21 cm Tube secured with: Tape Dental Injury: Teeth and Oropharynx as per pre-operative assessment

## 2020-01-16 ENCOUNTER — Encounter (HOSPITAL_COMMUNITY): Payer: Self-pay | Admitting: Surgery

## 2020-01-16 DIAGNOSIS — Z885 Allergy status to narcotic agent status: Secondary | ICD-10-CM | POA: Diagnosis not present

## 2020-01-16 DIAGNOSIS — K8010 Calculus of gallbladder with chronic cholecystitis without obstruction: Secondary | ICD-10-CM | POA: Diagnosis not present

## 2020-01-16 DIAGNOSIS — Z880 Allergy status to penicillin: Secondary | ICD-10-CM | POA: Diagnosis not present

## 2020-01-16 DIAGNOSIS — Z888 Allergy status to other drugs, medicaments and biological substances status: Secondary | ICD-10-CM | POA: Diagnosis not present

## 2020-01-16 LAB — SURGICAL PATHOLOGY

## 2020-01-16 MED ORDER — OXYCODONE HCL 5 MG PO TABS
5.0000 mg | ORAL_TABLET | Freq: Four times a day (QID) | ORAL | 0 refills | Status: DC | PRN
Start: 2020-01-16 — End: 2021-12-20

## 2020-01-16 NOTE — Anesthesia Postprocedure Evaluation (Signed)
Anesthesia Post Note  Patient: Susan Nunez  Procedure(s) Performed: LAPAROSCOPIC CHOLECYSTECTOMY WITH INTRAOPERATIVE CHOLANGIOGRAM (N/A Abdomen)     Patient location during evaluation: PACU Anesthesia Type: General Level of consciousness: awake and alert Pain management: pain level controlled Vital Signs Assessment: post-procedure vital signs reviewed and stable Respiratory status: spontaneous breathing, nonlabored ventilation, respiratory function stable and patient connected to nasal cannula oxygen Cardiovascular status: blood pressure returned to baseline and stable Postop Assessment: no apparent nausea or vomiting Anesthetic complications: no   No complications documented.  Last Vitals:  Vitals:   01/16/20 0115 01/16/20 0525  BP: (!) 102/58 106/66  Pulse: 72 80  Resp: 14 16  Temp: 36.8 C 36.8 C  SpO2: 97% 97%    Last Pain:  Vitals:   01/15/20 2141  TempSrc:   PainSc: Asleep                 Effie Berkshire

## 2020-01-16 NOTE — Discharge Summary (Signed)
Physician Discharge Summary [facility]  Patient ID: Susan Nunez MRN: [id number] DOB/AGE: [birth year] 79 y.o.  Admit date: 01/15/2020 Discharge date: 01/16/2020  Admission Diagnoses:  Chronic cholecystitis, cholelithiasis  Discharge Diagnoses:  Principal Problem:   Cholelithiasis with chronic cholecystitis   Discharged Condition: good  Hospital Course: Patient was admitted for observation following gallbladder surgery.  Post op course was uncomplicated.  Pain was well controlled.  Tolerated diet. Patient was prepared for discharge home on POD#1.  Consults: None  Treatments: surgery: lap chole with IOC  Discharge Exam: Blood pressure 106/66, pulse 80, temperature 98.2 F (36.8 C), resp. rate 16, height 5\' 9"  (1.753 m), weight 76 kg, SpO2 97 %. HEENT - clear Neck - soft Chest - clear bilaterally Cor - RRR Abd - soft without distension; Dermabond on incisions; mild ecchymosis  Disposition: Home  Discharge Instructions    Diet - low sodium heart healthy   Complete by: As directed    Discharge instructions   Complete by: As directed    [facility]  LAPAROSCOPIC SURGERY:  POST-OP INSTRUCTIONS  Always review your discharge instruction sheet given to you by the facility where your surgery was performed.  A prescription for pain medication may be given to you upon discharge.  Take your pain medication as prescribed.  If narcotic pain medicine is not needed, then you may take acetaminophen (Tylenol) or ibuprofen (Advil) as needed.  Take your usually prescribed medications unless otherwise directed.  If you need a refill on your pain medication, please contact your pharmacy.  They will contact our office to request authorization. Prescriptions will not be filled after 5 P.M. or on weekends.  You should follow a light diet the first few days after arrival home, such as soup and crackers or toast.  Be sure to include plenty of fluids  daily.  Most patients will experience some swelling and bruising in the area of the incisions.  Ice packs will help.  Swelling and bruising can take several days to resolve.   It is common to experience some constipation after surgery.  Increasing fluid intake and taking a stool softener (such as Colace) will usually help or prevent this problem from occurring.  A mild laxative (Milk of Magnesia or Miralax) should be taken according to package instructions if there has been no bowel movement after 48 hours.  You will likely have Dermabond (topical glue) over your incisions.  This seals the incisions and allows you to bathe and shower at any time after your surgery.  Glue should remain in place for up to 10 days.  It may be removed after 10 days by pealing off the Dermabond material or using Vaseline or naval jelly to remove.  If you have steri-strips over your incisions, you may remove the gauze bandage on the second day after surgery, and you may shower at that time.  Leave your steri-strips (small skin tapes) in place directly over the incision.  These strips should remain on the skin for 5-7 days and then be removed.  You may get them wet in the shower and pat them dry.  Any sutures or staples will be removed at the office during your follow-up visit.  ACTIVITIES:  You may resume regular (light) daily activities beginning the next day - such as daily self-care, walking, climbing stairs - gradually increasing activities as tolerated.  You may have sexual intercourse when it is comfortable.  Refrain from any heavy lifting or  straining until approved by your doctor.  You may drive when you are no longer taking prescription pain medication, when you can comfortably wear a seatbelt, and when you can safely maneuver your car and apply brakes.  You should see your doctor in the office for a follow-up appointment approximately 2-3 weeks after your surgery.  Make sure that you call for this appointment  within a day or two after you arrive home to insure a convenient appointment time.  WHEN TO CALL YOUR DOCTOR: Fever over 101.0 Inability to urinate Continued bleeding from incision Increased pain, redness, or drainage from the incision Increasing abdominal pain  The clinic staff is available to answer your questions during regular business hours.  Please don't hesitate to call and ask to speak to one of the nurses for clinical concerns.  If you have a medical emergency, go to the nearest emergency room or call 911.  A surgeon from [facility] is always on call for the hospital.  Earnstine Regal, MD, [facility] Office: [place] Free:  [place] [phone]  Website: [email or web address]   Increase activity slowly   Complete by: As directed    No dressing needed   Complete by: As directed      Allergies as of 01/16/2020      Reactions   Citalopram Other (See Comments)   Severe diarrhea, dizziness and memory loss   Ciprofloxacin Hcl Diarrhea   Demerol Other (See Comments)   Bumps at site of injection   Flagyl [metronidazole] Diarrhea, Nausea Only   Lovastatin Other (See Comments)   Muscle aches   Penicillins Itching, Rash   Has patient had a PCN reaction causing immediate rash, facial/tongue/throat swelling, SOB or lightheadedness with hypotension: No Has patient had a PCN reaction causing severe rash involving mucus membranes or skin necrosis: No Has patient had a PCN reaction that required hospitalization: No Has patient had a PCN reaction occurring within the last 10 years: No If all of the above answers are "NO", then may proceed with Cephalosporin use. *Tolerated Ancef IV 01/15/2020*   Sertraline Hcl Diarrhea   memory loss      Medication List    TAKE these medications   acidophilus Caps capsule Take 1 capsule by mouth daily.   aspirin 81 MG tablet Take 81 mg by mouth daily.   EYE VITAMINS  PO Take 1 tablet by mouth daily.   CENTRUM SILVER PO Take 1 tablet by mouth daily.   Fish Oil 1200 MG Caps Take 1,200 mg by mouth 3 (three) times a week.   latanoprost 0.005 % ophthalmic solution Commonly known as: XALATAN Place 1 drop into both eyes at bedtime.   lisinopril 5 MG tablet Commonly known as: ZESTRIL Take 5 mg by mouth daily.   omeprazole 20 MG tablet Commonly known as: PRILOSEC OTC Take 20 mg by mouth daily as needed (for heartburn).   oxyCODONE 5 MG immediate release tablet Commonly known as: Oxy IR/ROXICODONE Take 1-2 tablets (5-10 mg total) by mouth every 6 (six) hours as needed for moderate pain.   propranolol 10 MG tablet Commonly known as: INDERAL Take 1 tablet (10 mg total) by mouth 4 (four) times daily. What changed:   when to take this  reasons to take this   risedronate 150 MG tablet Commonly known as: ACTONEL Take 150 mg by mouth every 30 (thirty) days.   VITAMIN B COMPLEX PO Take 1 tablet by mouth daily.   vitamin  B-12 1000 MCG tablet Commonly known as: CYANOCOBALAMIN Take 1,000 mcg by mouth daily.   Vitamin D3 50 MCG (2000 UT) Tabs Take 2,000 Units by mouth daily.            Discharge Care Instructions  (From admission, onward)         Start     Ordered   01/16/20 0000  No dressing needed        01/16/20 0700          Follow-up Information    Armandina Gemma, MD. Schedule an appointment as soon as possible for a visit in 3 week(s).   Specialty: General Surgery Contact information: [address] [place] [address] [phone]               Earnstine Regal, MD, [facility] Office: [phone]   Signed: Armandina Gemma 01/16/2020, 7:01 AM

## 2020-01-30 DIAGNOSIS — H35362 Drusen (degenerative) of macula, left eye: Secondary | ICD-10-CM | POA: Diagnosis not present

## 2020-01-30 DIAGNOSIS — H353112 Nonexudative age-related macular degeneration, right eye, intermediate dry stage: Secondary | ICD-10-CM | POA: Diagnosis not present

## 2020-01-30 DIAGNOSIS — H35373 Puckering of macula, bilateral: Secondary | ICD-10-CM | POA: Diagnosis not present

## 2020-01-30 DIAGNOSIS — H43812 Vitreous degeneration, left eye: Secondary | ICD-10-CM | POA: Diagnosis not present

## 2020-02-02 ENCOUNTER — Ambulatory Visit (HOSPITAL_COMMUNITY): Admission: RE | Admit: 2020-02-02 | Payer: Medicare PPO | Source: Ambulatory Visit

## 2020-02-02 ENCOUNTER — Other Ambulatory Visit: Payer: Self-pay | Admitting: Surgery

## 2020-02-02 DIAGNOSIS — Z9049 Acquired absence of other specified parts of digestive tract: Secondary | ICD-10-CM | POA: Diagnosis not present

## 2020-02-03 ENCOUNTER — Ambulatory Visit (HOSPITAL_COMMUNITY)
Admission: RE | Admit: 2020-02-03 | Discharge: 2020-02-03 | Disposition: A | Discharge status: Home / Self Care | Payer: Medicare PPO | Source: Ambulatory Visit | Attending: Surgery | Admitting: Surgery

## 2020-02-03 ENCOUNTER — Other Ambulatory Visit: Payer: Self-pay

## 2020-02-03 DIAGNOSIS — Z9049 Acquired absence of other specified parts of digestive tract: Secondary | ICD-10-CM | POA: Insufficient documentation

## 2020-02-03 DIAGNOSIS — R112 Nausea with vomiting, unspecified: Secondary | ICD-10-CM | POA: Diagnosis not present

## 2020-03-09 DIAGNOSIS — H43813 Vitreous degeneration, bilateral: Secondary | ICD-10-CM | POA: Diagnosis not present

## 2020-03-09 DIAGNOSIS — H25813 Combined forms of age-related cataract, bilateral: Secondary | ICD-10-CM | POA: Diagnosis not present

## 2020-03-09 DIAGNOSIS — H353112 Nonexudative age-related macular degeneration, right eye, intermediate dry stage: Secondary | ICD-10-CM | POA: Diagnosis not present

## 2020-03-09 DIAGNOSIS — H401134 Primary open-angle glaucoma, bilateral, indeterminate stage: Secondary | ICD-10-CM | POA: Diagnosis not present

## 2020-04-06 ENCOUNTER — Other Ambulatory Visit (HOSPITAL_COMMUNITY): Payer: Self-pay | Admitting: Family Medicine

## 2020-04-06 ENCOUNTER — Other Ambulatory Visit: Payer: Self-pay | Admitting: Family Medicine

## 2020-04-06 DIAGNOSIS — R1012 Left upper quadrant pain: Secondary | ICD-10-CM | POA: Diagnosis not present

## 2020-04-30 ENCOUNTER — Other Ambulatory Visit: Payer: Self-pay

## 2020-04-30 ENCOUNTER — Encounter (HOSPITAL_COMMUNITY): Payer: Self-pay

## 2020-04-30 ENCOUNTER — Ambulatory Visit (HOSPITAL_COMMUNITY)
Admission: RE | Admit: 2020-04-30 | Discharge: 2020-04-30 | Disposition: A | Discharge status: Home / Self Care | Payer: Medicare PPO | Source: Ambulatory Visit | Attending: Family Medicine | Admitting: Family Medicine

## 2020-04-30 DIAGNOSIS — R1012 Left upper quadrant pain: Secondary | ICD-10-CM | POA: Insufficient documentation

## 2020-04-30 DIAGNOSIS — K5730 Diverticulosis of large intestine without perforation or abscess without bleeding: Secondary | ICD-10-CM | POA: Diagnosis not present

## 2020-04-30 LAB — POCT I-STAT CREATININE: Creatinine, Ser: 1.2 mg/dL — ABNORMAL HIGH (ref 0.44–1.00)

## 2020-04-30 MED ORDER — IOHEXOL 300 MG/ML  SOLN
100.0000 mL | Freq: Once | INTRAMUSCULAR | Status: AC | PRN
  Administered 2020-04-30: 75 mL via INTRAVENOUS

## 2020-05-04 ENCOUNTER — Other Ambulatory Visit (HOSPITAL_COMMUNITY): Payer: Self-pay | Admitting: Family Medicine

## 2020-05-04 DIAGNOSIS — Z1231 Encounter for screening mammogram for malignant neoplasm of breast: Secondary | ICD-10-CM

## 2020-05-14 DIAGNOSIS — R109 Unspecified abdominal pain: Secondary | ICD-10-CM | POA: Diagnosis not present

## 2020-05-14 DIAGNOSIS — R110 Nausea: Secondary | ICD-10-CM | POA: Diagnosis not present

## 2020-05-14 DIAGNOSIS — R1012 Left upper quadrant pain: Secondary | ICD-10-CM | POA: Diagnosis not present

## 2020-05-14 DIAGNOSIS — R634 Abnormal weight loss: Secondary | ICD-10-CM | POA: Diagnosis not present

## 2020-05-14 DIAGNOSIS — K529 Noninfective gastroenteritis and colitis, unspecified: Secondary | ICD-10-CM | POA: Diagnosis not present

## 2020-05-14 DIAGNOSIS — R935 Abnormal findings on diagnostic imaging of other abdominal regions, including retroperitoneum: Secondary | ICD-10-CM | POA: Diagnosis not present

## 2020-05-17 DIAGNOSIS — M899 Disorder of bone, unspecified: Secondary | ICD-10-CM | POA: Diagnosis not present

## 2020-05-17 DIAGNOSIS — Z79899 Other long term (current) drug therapy: Secondary | ICD-10-CM | POA: Diagnosis not present

## 2020-05-17 DIAGNOSIS — E782 Mixed hyperlipidemia: Secondary | ICD-10-CM | POA: Diagnosis not present

## 2020-05-21 DIAGNOSIS — K529 Noninfective gastroenteritis and colitis, unspecified: Secondary | ICD-10-CM | POA: Diagnosis not present

## 2020-05-21 DIAGNOSIS — I10 Essential (primary) hypertension: Secondary | ICD-10-CM | POA: Diagnosis not present

## 2020-05-21 DIAGNOSIS — N1831 Chronic kidney disease, stage 3a: Secondary | ICD-10-CM | POA: Diagnosis not present

## 2020-05-21 DIAGNOSIS — M810 Age-related osteoporosis without current pathological fracture: Secondary | ICD-10-CM | POA: Diagnosis not present

## 2020-05-21 DIAGNOSIS — R109 Unspecified abdominal pain: Secondary | ICD-10-CM | POA: Diagnosis not present

## 2020-05-21 DIAGNOSIS — F411 Generalized anxiety disorder: Secondary | ICD-10-CM | POA: Diagnosis not present

## 2020-05-21 DIAGNOSIS — E782 Mixed hyperlipidemia: Secondary | ICD-10-CM | POA: Diagnosis not present

## 2020-05-21 DIAGNOSIS — Z0000 Encounter for general adult medical examination without abnormal findings: Secondary | ICD-10-CM | POA: Diagnosis not present

## 2020-06-03 ENCOUNTER — Ambulatory Visit (HOSPITAL_COMMUNITY)
Admission: RE | Admit: 2020-06-03 | Discharge: 2020-06-03 | Disposition: A | Discharge status: Home / Self Care | Payer: Medicare PPO | Source: Ambulatory Visit | Attending: Family Medicine | Admitting: Family Medicine

## 2020-06-03 DIAGNOSIS — Z1231 Encounter for screening mammogram for malignant neoplasm of breast: Secondary | ICD-10-CM | POA: Diagnosis not present

## 2020-06-07 ENCOUNTER — Other Ambulatory Visit (HOSPITAL_COMMUNITY): Payer: Self-pay | Admitting: Family Medicine

## 2020-06-09 ENCOUNTER — Other Ambulatory Visit (HOSPITAL_COMMUNITY): Payer: Self-pay | Admitting: Family Medicine

## 2020-06-09 DIAGNOSIS — R928 Other abnormal and inconclusive findings on diagnostic imaging of breast: Secondary | ICD-10-CM

## 2020-06-17 ENCOUNTER — Ambulatory Visit (HOSPITAL_COMMUNITY)
Admission: RE | Admit: 2020-06-17 | Discharge: 2020-06-17 | Disposition: A | Discharge status: Home / Self Care | Payer: Medicare PPO | Source: Ambulatory Visit | Attending: Family Medicine | Admitting: Family Medicine

## 2020-06-17 DIAGNOSIS — R922 Inconclusive mammogram: Secondary | ICD-10-CM | POA: Diagnosis not present

## 2020-06-17 DIAGNOSIS — R928 Other abnormal and inconclusive findings on diagnostic imaging of breast: Secondary | ICD-10-CM | POA: Insufficient documentation

## 2020-06-23 DIAGNOSIS — K909 Intestinal malabsorption, unspecified: Secondary | ICD-10-CM | POA: Diagnosis not present

## 2020-06-23 DIAGNOSIS — R634 Abnormal weight loss: Secondary | ICD-10-CM | POA: Diagnosis not present

## 2020-06-23 DIAGNOSIS — R109 Unspecified abdominal pain: Secondary | ICD-10-CM | POA: Diagnosis not present

## 2020-07-27 DIAGNOSIS — H35362 Drusen (degenerative) of macula, left eye: Secondary | ICD-10-CM | POA: Diagnosis not present

## 2020-07-27 DIAGNOSIS — H43812 Vitreous degeneration, left eye: Secondary | ICD-10-CM | POA: Diagnosis not present

## 2020-07-27 DIAGNOSIS — H35373 Puckering of macula, bilateral: Secondary | ICD-10-CM | POA: Diagnosis not present

## 2020-07-27 DIAGNOSIS — H353112 Nonexudative age-related macular degeneration, right eye, intermediate dry stage: Secondary | ICD-10-CM | POA: Diagnosis not present

## 2020-08-19 DIAGNOSIS — H2512 Age-related nuclear cataract, left eye: Secondary | ICD-10-CM | POA: Diagnosis not present

## 2020-08-19 DIAGNOSIS — H353131 Nonexudative age-related macular degeneration, bilateral, early dry stage: Secondary | ICD-10-CM | POA: Diagnosis not present

## 2020-08-19 DIAGNOSIS — H2513 Age-related nuclear cataract, bilateral: Secondary | ICD-10-CM | POA: Diagnosis not present

## 2020-08-19 DIAGNOSIS — H18413 Arcus senilis, bilateral: Secondary | ICD-10-CM | POA: Diagnosis not present

## 2020-08-19 DIAGNOSIS — H401131 Primary open-angle glaucoma, bilateral, mild stage: Secondary | ICD-10-CM | POA: Diagnosis not present

## 2020-08-19 DIAGNOSIS — H25043 Posterior subcapsular polar age-related cataract, bilateral: Secondary | ICD-10-CM | POA: Diagnosis not present

## 2020-08-19 DIAGNOSIS — H25013 Cortical age-related cataract, bilateral: Secondary | ICD-10-CM | POA: Diagnosis not present

## 2020-10-20 DIAGNOSIS — H2512 Age-related nuclear cataract, left eye: Secondary | ICD-10-CM | POA: Diagnosis not present

## 2020-10-21 DIAGNOSIS — H2511 Age-related nuclear cataract, right eye: Secondary | ICD-10-CM | POA: Diagnosis not present

## 2020-11-19 DIAGNOSIS — R197 Diarrhea, unspecified: Secondary | ICD-10-CM | POA: Diagnosis not present

## 2020-11-19 DIAGNOSIS — M810 Age-related osteoporosis without current pathological fracture: Secondary | ICD-10-CM | POA: Diagnosis not present

## 2020-11-19 DIAGNOSIS — R002 Palpitations: Secondary | ICD-10-CM | POA: Diagnosis not present

## 2020-11-19 DIAGNOSIS — R634 Abnormal weight loss: Secondary | ICD-10-CM | POA: Diagnosis not present

## 2020-11-19 DIAGNOSIS — Z23 Encounter for immunization: Secondary | ICD-10-CM | POA: Diagnosis not present

## 2020-11-19 DIAGNOSIS — H40053 Ocular hypertension, bilateral: Secondary | ICD-10-CM | POA: Diagnosis not present

## 2020-11-19 DIAGNOSIS — E782 Mixed hyperlipidemia: Secondary | ICD-10-CM | POA: Diagnosis not present

## 2020-11-19 DIAGNOSIS — K909 Intestinal malabsorption, unspecified: Secondary | ICD-10-CM | POA: Diagnosis not present

## 2020-11-19 DIAGNOSIS — I10 Essential (primary) hypertension: Secondary | ICD-10-CM | POA: Diagnosis not present

## 2020-11-24 DIAGNOSIS — H2511 Age-related nuclear cataract, right eye: Secondary | ICD-10-CM | POA: Diagnosis not present

## 2020-12-11 ENCOUNTER — Encounter: Payer: Self-pay | Admitting: Cardiovascular Disease

## 2020-12-11 NOTE — Progress Notes (Signed)
Cardiology Office Note:    Date:  12/13/2020   ID:  Susan Nunez, DOB [birth year], MRN [id number]  PCP:  Cari Caraway, MD  Cardiologist:  Timmy Bubeck  Electrophysiologist:  None   Referring MD: Cari Caraway, MD   Chief Complaint  Patient presents with   Mitral Valve Prolapse   Chest Pain     Aug. 7, 2020    Susan Nunez is a 80 y.o. female with a hx of chest pain and MVP .   We were asked to see her today for follow up of her MVP and atypical CP by Dr. Addison Lank.   Has some occasional CP . Has some CP which we have attributed to MVP.    Worse with caffiene and with stress.    Primarily at night.   More aware of it at night.   Has skipping  Does her own yard work, mowing,   Took down a split rail fence.   Dismantled a Building services engineer .  Does not walk but is very busy   October 24, 2019:   Susan Nunez is seen today for follow-up of her mitral valve prolapse and atypical chest pain. She had a normal heart cath in 2011. Continues to have cp. previously took propranolol with good results  Pinching like cp with radiation sporatic  We discussed doing a coronary Ct angiogram  Her pains have been unchanged since I met her 10 years .    Oct. 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 EF at 65% -- mooth and normal coronary arteries -- Normal left ventricular systolic function.  We will continue with medical therapy.  I suspect her chest pain was due to her mitral valve prolapse syndrome --  Thayer Headings, M.D.    CHOLECYSTECTOMY N/A 01/15/2020   Procedure: LAPAROSCOPIC CHOLECYSTECTOMY WITH INTRAOPERATIVE CHOLANGIOGRAM;  Surgeon: Armandina Gemma, MD;  Location: [facility];  Service: General;  Laterality: N/A;   FOOT SURGERY  07/12/00   right   JOINT REPLACEMENT     KNEE ARTHROSCOPY  06/14/2009   LUMBAR SPINE SURGERY     lumbar spine fusion L5-S1 performed 20 years ago x 3   TOTAL KNEE ARTHROPLASTY Right 11/04/2014   Procedure: RIGHT TOTAL KNEE ARTHROPLASTY;  Surgeon: Newt Minion, MD;  Location: [place];  Service: Orthopedics;  Laterality: Right;   TOTAL VAGINAL HYSTERECTOMY  12/18/2001    Current Medications: Current Meds  Medication Sig   acidophilus (RISAQUAD) CAPS capsule Take 1 capsule by mouth daily.   aspirin 81 MG tablet Take 81 mg by mouth daily.  B Complex Vitamins (VITAMIN B COMPLEX PO) Take 1 tablet by mouth daily.   Cholecalciferol (VITAMIN D3) 2000 UNITS TABS Take 2,000 Units by mouth daily.   DUREZOL 0.05 % EMUL Place 1 drop into the right eye 3 (three) times daily.   ketorolac (ACULAR) 0.5 % ophthalmic solution Place 1 drop into the right eye 4 (four) times daily.   latanoprost (XALATAN) 0.005 % ophthalmic solution Place 1 drop into both eyes at bedtime.   lisinopril (PRINIVIL,ZESTRIL) 5 MG tablet Take 5 mg by mouth daily.     Multiple Vitamins-Minerals (CENTRUM SILVER PO) Take 1 tablet by mouth daily.   Multiple Vitamins-Minerals (EYE VITAMINS PO) Take 1 tablet by mouth daily.   Omega-3 Fatty Acids (FISH OIL) 1200 MG CAPS Take 1,200 mg by mouth 3 (three) times a week.   oxyCODONE (OXY IR/ROXICODONE) 5 MG immediate release tablet Take 1-2 tablets (5-10 mg total) by mouth every 6 (six) hours as needed for moderate pain.   propranolol  (INDERAL) 10 MG tablet Take 1 tablet (10 mg total) by mouth 4 (four) times daily. (Patient taking differently: Take 10 mg by mouth 4 (four) times daily as needed (palpitations).)   risedronate (ACTONEL) 150 MG tablet Take 150 mg by mouth every 30 (thirty) days.    vitamin B-12 (CYANOCOBALAMIN) 1000 MCG tablet Take 1,000 mcg by mouth daily.     Allergies:   Citalopram, Ciprofloxacin hcl, Demerol, Flagyl [metronidazole], Lovastatin, Penicillins, and Sertraline hcl   Social History   Socioeconomic History   Marital status: Widowed    Spouse name: Not on file   Number of children: Not on file   Years of education: Not on file   Highest education level: Not on file  Occupational History   Not on file  Tobacco Use   Smoking status: Never   Smokeless tobacco: Never  Vaping Use   Vaping Use: Never used  Substance and Sexual Activity   Alcohol use: No   Drug use: No   Sexual activity: Not on file  Other Topics Concern   Not on file  Social History Narrative   Not on file   Social Determinants of Health   Financial Resource Strain: Not on file  Food Insecurity: Not on file  Transportation Needs: Not on file  Physical Activity: Not on file  Stress: Not on file  Social Connections: Not on file     Family History: The patient's family history includes Coronary artery disease in her father; Heart attack in her father; Hypertension in her mother.  ROS:   Please see the history of present illness.     All other systems reviewed and are negative.  EKGs/Labs/Other Studies Reviewed:    The following studies were reviewed today:      Recent Labs: 01/07/2020: BUN 22; Hemoglobin 12.7; Platelets 290; Potassium 4.7; Sodium 141 04/30/2020: Creatinine, Ser 1.20  Recent Lipid Panel No results found for: CHOL, TRIG, HDL, CHOLHDL, VLDL, LDLCALC, LDLDIRECT  Physical Exam:    Physical Exam: Blood pressure 130/80, pulse 62, height 5' 9"  (1.753 m), weight 162 lb 12.8 oz (73.8 kg), SpO2  96 %.  GEN:  Well nourished, well developed in no acute distress HEENT: Normal NECK: No JVD; No carotid bruits LYMPHATICS: No lymphadenopathy CARDIAC: RRR , no murmurs, rubs, gallops RESPIRATORY:  Clear to auscultation without rales, wheezing or rhonchi  ABDOMEN: Soft, non-tender, non-distended MUSCULOSKELETAL:  No edema; No deformity  SKIN: Warm and dry NEUROLOGIC:  Alert and oriented x 3  EKG:     Oct. 3, 2022:  NSR at 62.  No St or T wave changes   ASSESSMENT:    1. Atypical chest pain     PLAN:       Atypical chest pain : I will has rare episodes of atypical chest pain.  She takes propranolol.  For years without she has some degree of mitral valve prolapse but her last echo does not show any evidence of mitral valve prolapse.  I do not hear any evidence of mitral regurgitation today.   2.  Shortness of breath:   Breathing seems to be normal now   Will have her see an APP in 1 year     Medication Adjustments/Labs and Tests Ordered: Current medicines are reviewed at length with the patient today.  Concerns regarding medicines are outlined above.  Orders Placed This Encounter  Procedures   EKG 12-Lead    No orders of the defined types were placed in this encounter.   Patient Instructions  Medication Instructions:  Your physician recommends that you continue on your current medications as directed. Please refer to the Current Medication list given to you today.  *If you need a refill on your cardiac medications before your next appointment, please call your pharmacy*   Lab Work: NONE If you have labs (blood work) drawn today and your tests are completely normal, you will receive your results only by: [place] (if you have MyChart) OR A paper copy in the mail If you have any lab test that is abnormal or we need to change your treatment, we will call you to review the results.   Testing/Procedures: NONE   Follow-Up: At [facility], you and  your health needs are our priority.  As part of our continuing mission to provide you with exceptional heart care, we have created designated Provider Care Teams.  These Care Teams include your primary Cardiologist (physician) and Advanced Practice Providers (APPs -  Physician Assistants and Nurse Practitioners) who all work together to provide you with the care you need, when you need it.     Your next appointment:   12 month(s)  The format for your next appointment:   In Person  Provider:   You may see one of the following Advanced Practice Providers on your designated Care Team:   Richardson Dopp, PA-C Robbie Lis, [place]     Signed, Mertie Moores, MD  12/13/2020 8:29 PM    [place]

## 2020-12-13 ENCOUNTER — Encounter: Payer: Self-pay | Admitting: Cardiovascular Disease

## 2020-12-13 ENCOUNTER — Ambulatory Visit: Payer: Medicare PPO | Admitting: Cardiovascular Disease

## 2020-12-13 ENCOUNTER — Other Ambulatory Visit: Payer: Self-pay

## 2020-12-13 VITALS — BP 130/80 | HR 62 | Ht 69.0 in | Wt 162.8 lb

## 2020-12-13 DIAGNOSIS — R0789 Other chest pain: Secondary | ICD-10-CM

## 2020-12-13 NOTE — Patient Instructions (Signed)
Medication Instructions:  Your physician recommends that you continue on your current medications as directed. Please refer to the Current Medication list given to you today.  *If you need a refill on your cardiac medications before your next appointment, please call your pharmacy*   Lab Work: NONE If you have labs (blood work) drawn today and your tests are completely normal, you will receive your results only by: [place] (if you have MyChart) OR A paper copy in the mail If you have any lab test that is abnormal or we need to change your treatment, we will call you to review the results.   Testing/Procedures: NONE   Follow-Up: At [facility], you and your health needs are our priority.  As part of our continuing mission to provide you with exceptional heart care, we have created designated Provider Care Teams.  These Care Teams include your primary Cardiologist (physician) and Advanced Practice Providers (APPs -  Physician Assistants and Nurse Practitioners) who all work together to provide you with the care you need, when you need it.     Your next appointment:   12 month(s)  The format for your next appointment:   In Person  Provider:   You may see one of the following Advanced Practice Providers on your designated Care Team:   Richardson Dopp, PA-C [place], [place]

## 2020-12-14 ENCOUNTER — Other Ambulatory Visit: Payer: Self-pay | Admitting: Physician Assistant

## 2020-12-14 DIAGNOSIS — K529 Noninfective gastroenteritis and colitis, unspecified: Secondary | ICD-10-CM | POA: Diagnosis not present

## 2020-12-14 DIAGNOSIS — R110 Nausea: Secondary | ICD-10-CM | POA: Diagnosis not present

## 2020-12-14 DIAGNOSIS — R109 Unspecified abdominal pain: Secondary | ICD-10-CM

## 2020-12-15 ENCOUNTER — Other Ambulatory Visit: Payer: Self-pay | Admitting: Physician Assistant

## 2020-12-15 ENCOUNTER — Other Ambulatory Visit (HOSPITAL_COMMUNITY): Payer: Self-pay | Admitting: Physician Assistant

## 2020-12-15 DIAGNOSIS — R109 Unspecified abdominal pain: Secondary | ICD-10-CM

## 2020-12-23 ENCOUNTER — Ambulatory Visit: Payer: Medicare PPO

## 2020-12-28 DIAGNOSIS — R109 Unspecified abdominal pain: Secondary | ICD-10-CM | POA: Diagnosis not present

## 2021-01-14 ENCOUNTER — Other Ambulatory Visit: Payer: Self-pay

## 2021-01-14 ENCOUNTER — Ambulatory Visit (HOSPITAL_COMMUNITY)
Admission: RE | Admit: 2021-01-14 | Discharge: 2021-01-14 | Disposition: A | Discharge status: Home / Self Care | Payer: Medicare PPO | Source: Ambulatory Visit | Attending: Physician Assistant | Admitting: Physician Assistant

## 2021-01-14 DIAGNOSIS — R109 Unspecified abdominal pain: Secondary | ICD-10-CM | POA: Insufficient documentation

## 2021-01-14 LAB — POCT I-STAT CREATININE: Creatinine, Ser: 1.2 mg/dL — ABNORMAL HIGH (ref 0.44–1.00)

## 2021-01-14 MED ORDER — IOHEXOL 300 MG/ML  SOLN
125.0000 mL | Freq: Once | INTRAMUSCULAR | Status: AC | PRN
  Administered 2021-01-14: 100 mL via INTRAVENOUS

## 2021-01-25 DIAGNOSIS — H353112 Nonexudative age-related macular degeneration, right eye, intermediate dry stage: Secondary | ICD-10-CM | POA: Diagnosis not present

## 2021-01-25 DIAGNOSIS — H35362 Drusen (degenerative) of macula, left eye: Secondary | ICD-10-CM | POA: Diagnosis not present

## 2021-01-25 DIAGNOSIS — H35373 Puckering of macula, bilateral: Secondary | ICD-10-CM | POA: Diagnosis not present

## 2021-01-25 DIAGNOSIS — H43812 Vitreous degeneration, left eye: Secondary | ICD-10-CM | POA: Diagnosis not present

## 2021-01-27 DIAGNOSIS — Z23 Encounter for immunization: Secondary | ICD-10-CM | POA: Diagnosis not present

## 2021-02-01 DIAGNOSIS — K589 Irritable bowel syndrome without diarrhea: Secondary | ICD-10-CM | POA: Diagnosis not present

## 2021-02-01 DIAGNOSIS — K639 Disease of intestine, unspecified: Secondary | ICD-10-CM | POA: Diagnosis not present

## 2021-02-01 DIAGNOSIS — R935 Abnormal findings on diagnostic imaging of other abdominal regions, including retroperitoneum: Secondary | ICD-10-CM | POA: Diagnosis not present

## 2021-04-01 ENCOUNTER — Other Ambulatory Visit: Payer: Self-pay | Admitting: Cardiovascular Disease

## 2021-05-19 ENCOUNTER — Other Ambulatory Visit (HOSPITAL_COMMUNITY): Payer: Self-pay | Admitting: Family Medicine

## 2021-05-19 DIAGNOSIS — Z1231 Encounter for screening mammogram for malignant neoplasm of breast: Secondary | ICD-10-CM

## 2021-05-23 DIAGNOSIS — K219 Gastro-esophageal reflux disease without esophagitis: Secondary | ICD-10-CM | POA: Diagnosis not present

## 2021-05-23 DIAGNOSIS — R1084 Generalized abdominal pain: Secondary | ICD-10-CM | POA: Diagnosis not present

## 2021-05-23 DIAGNOSIS — K668 Other specified disorders of peritoneum: Secondary | ICD-10-CM | POA: Diagnosis not present

## 2021-05-31 DIAGNOSIS — Z79899 Other long term (current) drug therapy: Secondary | ICD-10-CM | POA: Diagnosis not present

## 2021-05-31 DIAGNOSIS — M810 Age-related osteoporosis without current pathological fracture: Secondary | ICD-10-CM | POA: Diagnosis not present

## 2021-05-31 DIAGNOSIS — E782 Mixed hyperlipidemia: Secondary | ICD-10-CM | POA: Diagnosis not present

## 2021-06-02 DIAGNOSIS — R110 Nausea: Secondary | ICD-10-CM | POA: Diagnosis not present

## 2021-06-02 DIAGNOSIS — R1084 Generalized abdominal pain: Secondary | ICD-10-CM | POA: Diagnosis not present

## 2021-06-02 DIAGNOSIS — K5900 Constipation, unspecified: Secondary | ICD-10-CM | POA: Diagnosis not present

## 2021-06-07 DIAGNOSIS — K5900 Constipation, unspecified: Secondary | ICD-10-CM | POA: Diagnosis not present

## 2021-06-07 DIAGNOSIS — Z1389 Encounter for screening for other disorder: Secondary | ICD-10-CM | POA: Diagnosis not present

## 2021-06-07 DIAGNOSIS — R109 Unspecified abdominal pain: Secondary | ICD-10-CM | POA: Diagnosis not present

## 2021-06-07 DIAGNOSIS — M810 Age-related osteoporosis without current pathological fracture: Secondary | ICD-10-CM | POA: Diagnosis not present

## 2021-06-07 DIAGNOSIS — R110 Nausea: Secondary | ICD-10-CM | POA: Diagnosis not present

## 2021-06-07 DIAGNOSIS — R002 Palpitations: Secondary | ICD-10-CM | POA: Diagnosis not present

## 2021-06-07 DIAGNOSIS — Z01419 Encounter for gynecological examination (general) (routine) without abnormal findings: Secondary | ICD-10-CM | POA: Diagnosis not present

## 2021-06-07 DIAGNOSIS — I10 Essential (primary) hypertension: Secondary | ICD-10-CM | POA: Diagnosis not present

## 2021-06-07 DIAGNOSIS — Z0000 Encounter for general adult medical examination without abnormal findings: Secondary | ICD-10-CM | POA: Diagnosis not present

## 2021-06-20 ENCOUNTER — Ambulatory Visit (HOSPITAL_COMMUNITY)
Admission: RE | Admit: 2021-06-20 | Discharge: 2021-06-20 | Disposition: A | Discharge status: Home / Self Care | Payer: Medicare PPO | Source: Ambulatory Visit | Attending: Family Medicine | Admitting: Family Medicine

## 2021-06-20 DIAGNOSIS — Z1231 Encounter for screening mammogram for malignant neoplasm of breast: Secondary | ICD-10-CM | POA: Insufficient documentation

## 2021-06-24 DIAGNOSIS — H353132 Nonexudative age-related macular degeneration, bilateral, intermediate dry stage: Secondary | ICD-10-CM | POA: Diagnosis not present

## 2021-06-24 DIAGNOSIS — H524 Presbyopia: Secondary | ICD-10-CM | POA: Diagnosis not present

## 2021-06-24 DIAGNOSIS — H5203 Hypermetropia, bilateral: Secondary | ICD-10-CM | POA: Diagnosis not present

## 2021-06-24 DIAGNOSIS — H52223 Regular astigmatism, bilateral: Secondary | ICD-10-CM | POA: Diagnosis not present

## 2021-06-24 DIAGNOSIS — Z9849 Cataract extraction status, unspecified eye: Secondary | ICD-10-CM | POA: Diagnosis not present

## 2021-06-24 DIAGNOSIS — Z961 Presence of intraocular lens: Secondary | ICD-10-CM | POA: Diagnosis not present

## 2021-06-24 DIAGNOSIS — H401131 Primary open-angle glaucoma, bilateral, mild stage: Secondary | ICD-10-CM | POA: Diagnosis not present

## 2021-07-01 ENCOUNTER — Other Ambulatory Visit (HOSPITAL_COMMUNITY): Payer: Self-pay | Admitting: Family Medicine

## 2021-07-01 DIAGNOSIS — M810 Age-related osteoporosis without current pathological fracture: Secondary | ICD-10-CM

## 2021-07-07 ENCOUNTER — Ambulatory Visit (HOSPITAL_COMMUNITY)
Admission: RE | Admit: 2021-07-07 | Discharge: 2021-07-07 | Disposition: A | Discharge status: Home / Self Care | Payer: Medicare PPO | Source: Ambulatory Visit | Attending: Family Medicine | Admitting: Family Medicine

## 2021-07-07 DIAGNOSIS — M810 Age-related osteoporosis without current pathological fracture: Secondary | ICD-10-CM | POA: Diagnosis not present

## 2021-07-07 DIAGNOSIS — Z780 Asymptomatic menopausal state: Secondary | ICD-10-CM | POA: Diagnosis not present

## 2021-07-07 DIAGNOSIS — M85851 Other specified disorders of bone density and structure, right thigh: Secondary | ICD-10-CM | POA: Diagnosis not present

## 2021-07-26 DIAGNOSIS — H35362 Drusen (degenerative) of macula, left eye: Secondary | ICD-10-CM | POA: Diagnosis not present

## 2021-07-26 DIAGNOSIS — H35373 Puckering of macula, bilateral: Secondary | ICD-10-CM | POA: Diagnosis not present

## 2021-07-26 DIAGNOSIS — H43812 Vitreous degeneration, left eye: Secondary | ICD-10-CM | POA: Diagnosis not present

## 2021-07-26 DIAGNOSIS — H353112 Nonexudative age-related macular degeneration, right eye, intermediate dry stage: Secondary | ICD-10-CM | POA: Diagnosis not present

## 2021-08-01 DIAGNOSIS — R1084 Generalized abdominal pain: Secondary | ICD-10-CM | POA: Diagnosis not present

## 2021-08-01 DIAGNOSIS — R634 Abnormal weight loss: Secondary | ICD-10-CM | POA: Diagnosis not present

## 2021-08-01 DIAGNOSIS — R6881 Early satiety: Secondary | ICD-10-CM | POA: Diagnosis not present

## 2021-08-02 ENCOUNTER — Other Ambulatory Visit: Payer: Self-pay | Admitting: Gastroenterology

## 2021-08-02 ENCOUNTER — Other Ambulatory Visit (HOSPITAL_COMMUNITY): Payer: Self-pay | Admitting: Gastroenterology

## 2021-08-02 DIAGNOSIS — R634 Abnormal weight loss: Secondary | ICD-10-CM

## 2021-08-05 ENCOUNTER — Ambulatory Visit (HOSPITAL_COMMUNITY)
Admission: RE | Admit: 2021-08-05 | Discharge: 2021-08-05 | Disposition: A | Discharge status: Home / Self Care | Payer: Medicare PPO | Source: Ambulatory Visit | Attending: Gastroenterology | Admitting: Gastroenterology

## 2021-08-05 DIAGNOSIS — R634 Abnormal weight loss: Secondary | ICD-10-CM | POA: Diagnosis not present

## 2021-08-05 DIAGNOSIS — N281 Cyst of kidney, acquired: Secondary | ICD-10-CM | POA: Diagnosis not present

## 2021-08-05 DIAGNOSIS — K5730 Diverticulosis of large intestine without perforation or abscess without bleeding: Secondary | ICD-10-CM | POA: Diagnosis not present

## 2021-08-05 MED ORDER — IOHEXOL 300 MG/ML  SOLN
100.0000 mL | Freq: Once | INTRAMUSCULAR | Status: AC | PRN
  Administered 2021-08-05: 80 mL via INTRAVENOUS

## 2021-08-05 MED ORDER — IOHEXOL 9 MG/ML PO SOLN
ORAL | Status: AC
  Filled 2021-08-05: qty 1000

## 2021-08-09 LAB — POCT I-STAT CREATININE: Creatinine, Ser: 1.3 mg/dL — ABNORMAL HIGH (ref 0.44–1.00)

## 2021-09-02 DIAGNOSIS — G8929 Other chronic pain: Secondary | ICD-10-CM | POA: Diagnosis not present

## 2021-09-02 DIAGNOSIS — R109 Unspecified abdominal pain: Secondary | ICD-10-CM | POA: Diagnosis not present

## 2021-09-30 DIAGNOSIS — R109 Unspecified abdominal pain: Secondary | ICD-10-CM | POA: Diagnosis not present

## 2021-09-30 DIAGNOSIS — G8929 Other chronic pain: Secondary | ICD-10-CM | POA: Diagnosis not present

## 2021-10-12 DIAGNOSIS — L821 Other seborrheic keratosis: Secondary | ICD-10-CM | POA: Diagnosis not present

## 2021-10-12 DIAGNOSIS — L570 Actinic keratosis: Secondary | ICD-10-CM | POA: Diagnosis not present

## 2021-10-12 DIAGNOSIS — C44319 Basal cell carcinoma of skin of other parts of face: Secondary | ICD-10-CM | POA: Diagnosis not present

## 2021-10-12 DIAGNOSIS — D225 Melanocytic nevi of trunk: Secondary | ICD-10-CM | POA: Diagnosis not present

## 2021-10-12 DIAGNOSIS — Z85828 Personal history of other malignant neoplasm of skin: Secondary | ICD-10-CM | POA: Diagnosis not present

## 2021-10-12 DIAGNOSIS — C44311 Basal cell carcinoma of skin of nose: Secondary | ICD-10-CM | POA: Diagnosis not present

## 2021-10-12 DIAGNOSIS — D485 Neoplasm of uncertain behavior of skin: Secondary | ICD-10-CM | POA: Diagnosis not present

## 2021-10-26 DIAGNOSIS — K8080 Other cholelithiasis without obstruction: Secondary | ICD-10-CM | POA: Diagnosis not present

## 2021-10-26 DIAGNOSIS — Z01818 Encounter for other preprocedural examination: Secondary | ICD-10-CM | POA: Diagnosis not present

## 2021-10-26 DIAGNOSIS — F419 Anxiety disorder, unspecified: Secondary | ICD-10-CM | POA: Diagnosis not present

## 2021-10-26 DIAGNOSIS — I10 Essential (primary) hypertension: Secondary | ICD-10-CM | POA: Diagnosis not present

## 2021-10-26 DIAGNOSIS — Z9181 History of falling: Secondary | ICD-10-CM | POA: Diagnosis not present

## 2021-10-26 DIAGNOSIS — N1832 Chronic kidney disease, stage 3b: Secondary | ICD-10-CM | POA: Diagnosis not present

## 2021-11-04 DIAGNOSIS — I341 Nonrheumatic mitral (valve) prolapse: Secondary | ICD-10-CM | POA: Diagnosis not present

## 2021-11-04 DIAGNOSIS — K5710 Diverticulosis of small intestine without perforation or abscess without bleeding: Secondary | ICD-10-CM | POA: Diagnosis not present

## 2021-11-04 DIAGNOSIS — K562 Volvulus: Secondary | ICD-10-CM | POA: Diagnosis not present

## 2021-11-04 DIAGNOSIS — N183 Chronic kidney disease, stage 3 unspecified: Secondary | ICD-10-CM | POA: Diagnosis not present

## 2021-11-04 DIAGNOSIS — I129 Hypertensive chronic kidney disease with stage 1 through stage 4 chronic kidney disease, or unspecified chronic kidney disease: Secondary | ICD-10-CM | POA: Diagnosis not present

## 2021-11-04 DIAGNOSIS — I10 Essential (primary) hypertension: Secondary | ICD-10-CM | POA: Diagnosis not present

## 2021-11-04 DIAGNOSIS — H9192 Unspecified hearing loss, left ear: Secondary | ICD-10-CM | POA: Diagnosis not present

## 2021-11-04 DIAGNOSIS — K219 Gastro-esophageal reflux disease without esophagitis: Secondary | ICD-10-CM | POA: Diagnosis not present

## 2021-11-04 DIAGNOSIS — E785 Hyperlipidemia, unspecified: Secondary | ICD-10-CM | POA: Diagnosis not present

## 2021-11-04 DIAGNOSIS — E0590 Thyrotoxicosis, unspecified without thyrotoxic crisis or storm: Secondary | ICD-10-CM | POA: Diagnosis not present

## 2021-11-04 DIAGNOSIS — K660 Peritoneal adhesions (postprocedural) (postinfection): Secondary | ICD-10-CM | POA: Diagnosis not present

## 2021-11-04 DIAGNOSIS — K5712 Diverticulitis of small intestine without perforation or abscess without bleeding: Secondary | ICD-10-CM | POA: Diagnosis not present

## 2021-11-04 DIAGNOSIS — K558 Other vascular disorders of intestine: Secondary | ICD-10-CM | POA: Diagnosis not present

## 2021-11-04 DIAGNOSIS — H3530 Unspecified macular degeneration: Secondary | ICD-10-CM | POA: Diagnosis not present

## 2021-12-01 DIAGNOSIS — N1831 Chronic kidney disease, stage 3a: Secondary | ICD-10-CM | POA: Diagnosis not present

## 2021-12-01 DIAGNOSIS — I10 Essential (primary) hypertension: Secondary | ICD-10-CM | POA: Diagnosis not present

## 2021-12-09 DIAGNOSIS — F411 Generalized anxiety disorder: Secondary | ICD-10-CM | POA: Diagnosis not present

## 2021-12-09 DIAGNOSIS — I10 Essential (primary) hypertension: Secondary | ICD-10-CM | POA: Diagnosis not present

## 2021-12-09 DIAGNOSIS — Z23 Encounter for immunization: Secondary | ICD-10-CM | POA: Diagnosis not present

## 2021-12-09 DIAGNOSIS — R002 Palpitations: Secondary | ICD-10-CM | POA: Diagnosis not present

## 2021-12-09 DIAGNOSIS — E782 Mixed hyperlipidemia: Secondary | ICD-10-CM | POA: Diagnosis not present

## 2021-12-09 DIAGNOSIS — M810 Age-related osteoporosis without current pathological fracture: Secondary | ICD-10-CM | POA: Diagnosis not present

## 2021-12-09 DIAGNOSIS — Z6822 Body mass index (BMI) 22.0-22.9, adult: Secondary | ICD-10-CM | POA: Diagnosis not present

## 2021-12-09 DIAGNOSIS — K5710 Diverticulosis of small intestine without perforation or abscess without bleeding: Secondary | ICD-10-CM | POA: Diagnosis not present

## 2021-12-18 ENCOUNTER — Encounter: Payer: Self-pay | Admitting: Cardiovascular Disease

## 2021-12-18 NOTE — Progress Notes (Unsigned)
Cardiology Office Note:    Date:  12/18/2021   ID:  Susan Nunez, DOB 08-18-1940, MRN [id number]  PCP:  Cari Caraway, MD  Cardiologist:  Freda Jaquith  Electrophysiologist:  None   Referring MD: Cari Caraway, MD   Chief Complaint  Patient presents with   Mitral Valve Prolapse   Chest Pain     Aug. 7, 2020    Susan Nunez is a 81 y.o. female with a hx of chest pain and MVP .   We were asked to see her today for follow up of her MVP and atypical CP by Dr. Addison Lank.   Has some occasional CP . Has some CP which we have attributed to MVP.    Worse with caffiene and with stress.    Primarily at night.   More aware of it at night.   Has skipping  Does her own yard work, mowing,   Took down a split rail fence.   Dismantled a Building services engineer .  Does not walk but is very busy   October 24, 2019:   Susan Nunez is seen today for follow-up of her mitral valve prolapse and atypical chest pain. She had a normal heart cath in 2011. Continues to have cp. previously took propranolol with good results  Pinching like cp with radiation sporatic  We discussed doing a coronary Ct angiogram  Her pains have been unchanged since I met her 10 years .    Oct. 3, 2022: Susan Nunez is seen today for follow up of her MVP and atypical CP    Oct. 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 EF at 65% -- mooth and normal coronary arteries -- Normal left ventricular systolic function.  We will continue with medical therapy.  I suspect her chest pain was due to her mitral valve prolapse syndrome --  Thayer Headings, M.D.    CHOLECYSTECTOMY N/A 01/15/2020   Procedure: LAPAROSCOPIC CHOLECYSTECTOMY WITH INTRAOPERATIVE CHOLANGIOGRAM;  Surgeon: Armandina Gemma, MD;  Location: [facility];  Service: General;  Laterality: N/A;   FOOT SURGERY  07/12/00   right   JOINT REPLACEMENT     KNEE ARTHROSCOPY  06/14/2009   LUMBAR SPINE SURGERY     lumbar spine fusion L5-S1 performed 20 years ago x 3   TOTAL KNEE ARTHROPLASTY Right 11/04/2014   Procedure: RIGHT TOTAL KNEE ARTHROPLASTY;  Surgeon: Newt Minion, MD;  Location: [place];  Service: Orthopedics;  Laterality: Right;   TOTAL VAGINAL HYSTERECTOMY  12/18/2001    Current Medications: No outpatient medications have been marked as taking for the  12/20/21 encounter (Office Visit) with Teruo Stilley, Wonda Cheng, MD.     Allergies:   Citalopram, Ciprofloxacin hcl, Demerol, Flagyl [metronidazole], Lovastatin, Penicillins, and Sertraline hcl   Social History   Socioeconomic History   Marital status: Widowed    Spouse name: Not on file   Number of children: Not on file   Years of education: Not on file   Highest education level: Not on file  Occupational History   Not on file  Tobacco Use   Smoking status: Never   Smokeless tobacco: Never  Vaping Use   Vaping Use: Never used  Substance and Sexual Activity   Alcohol use: No   Drug use: No   Sexual activity: Not on file  Other Topics Concern   Not on file  Social History Narrative   Not on file   Social Determinants of Health   Financial Resource Strain: Not on file  Food Insecurity: Not on file  Transportation Needs: Not on file  Physical Activity: Not on file  Stress: Not on file   Social Connections: Not on file     Family History: The patient's family history includes Coronary artery disease in her father; Heart attack in her father; Hypertension in her mother.  ROS:   Please see the history of present illness.     All other systems reviewed and are negative.  EKGs/Labs/Other Studies Reviewed:    The following studies were reviewed today:      Recent Labs: 08/05/2021: Creatinine, Ser 1.30  Recent Lipid Panel No results found for: "CHOL", "TRIG", "HDL", "CHOLHDL", "VLDL", "LDLCALC", "LDLDIRECT"  Physical Exam:    Physical Exam: There were no vitals taken for this visit.  No BP recorded.  {Refresh Note OR Click here to enter BP  :1}***    GEN:  Well nourished, well developed in no acute distress HEENT: Normal NECK: No JVD; No carotid bruits LYMPHATICS: No lymphadenopathy CARDIAC: RRR ***, no murmurs, rubs, gallops RESPIRATORY:  Clear to auscultation without rales, wheezing or rhonchi  ABDOMEN: Soft, non-tender, non-distended MUSCULOSKELETAL:  No edema; No deformity  SKIN: Warm and dry NEUROLOGIC:  Alert and oriented x 3   EKG:       ASSESSMENT:    No diagnosis found.   PLAN:       Atypical chest pain :    2.  Shortness of breath:        Medication Adjustments/Labs and Tests Ordered: Current medicines are reviewed at length with the patient today.  Concerns regarding medicines are outlined above.  No orders of the defined types were placed in this encounter.   No orders of the defined types were placed in this encounter.    There are no Patient Instructions on file for this visit.   Signed, Mertie Moores, MD  12/18/2021 7:25 PM    [place]

## 2021-12-20 ENCOUNTER — Ambulatory Visit: Payer: Medicare PPO | Attending: Cardiovascular Disease | Admitting: Cardiovascular Disease

## 2021-12-20 ENCOUNTER — Encounter: Payer: Self-pay | Admitting: Cardiovascular Disease

## 2021-12-20 VITALS — BP 100/60 | HR 76 | Ht 69.0 in | Wt 149.6 lb

## 2021-12-20 DIAGNOSIS — I341 Nonrheumatic mitral (valve) prolapse: Secondary | ICD-10-CM | POA: Diagnosis not present

## 2021-12-20 DIAGNOSIS — E782 Mixed hyperlipidemia: Secondary | ICD-10-CM

## 2021-12-20 NOTE — Patient Instructions (Signed)
Medication Instructions:  NO CHANGES *If you need a refill on your cardiac medications before your next appointment, please call your pharmacy*   Lab Work: NONE If you have labs (blood work) drawn today and your tests are completely normal, you will receive your results only by: [place] (if you have MyChart) OR A paper copy in the mail If you have any lab test that is abnormal or we need to change your treatment, we will call you to review the results.   Testing/Procedures: NONE   Follow-Up: At [facility], you and your health needs are our priority.  As part of our continuing mission to provide you with exceptional heart care, we have created designated Provider Care Teams.  These Care Teams include your primary Cardiologist (physician) and Advanced Practice Providers (APPs -  Physician Assistants and Nurse Practitioners) who all work together to provide you with the care you need, when you need it.  We recommend signing up for the patient portal called "MyChart".  Sign up information is provided on this After Visit Summary.  MyChart is used to connect with patients for Virtual Visits (Telemedicine).  Patients are able to view lab/test results, encounter notes, upcoming appointments, etc.  Non-urgent messages can be sent to your provider as well.   To learn more about what you can do with MyChart, go to [email or web address].    Your next appointment:    1year(s)  The format for your next appointment:   In Person  Provider:   Christen Bame NP [facility]     Other Instructions NONE  Important Information About [place]

## 2021-12-22 DIAGNOSIS — Z85828 Personal history of other malignant neoplasm of skin: Secondary | ICD-10-CM | POA: Diagnosis not present

## 2021-12-22 DIAGNOSIS — C44311 Basal cell carcinoma of skin of nose: Secondary | ICD-10-CM | POA: Diagnosis not present

## 2022-01-27 DIAGNOSIS — H43812 Vitreous degeneration, left eye: Secondary | ICD-10-CM | POA: Diagnosis not present

## 2022-01-27 DIAGNOSIS — H353112 Nonexudative age-related macular degeneration, right eye, intermediate dry stage: Secondary | ICD-10-CM | POA: Diagnosis not present

## 2022-01-27 DIAGNOSIS — H35362 Drusen (degenerative) of macula, left eye: Secondary | ICD-10-CM | POA: Diagnosis not present

## 2022-01-27 DIAGNOSIS — H35373 Puckering of macula, bilateral: Secondary | ICD-10-CM | POA: Diagnosis not present

## 2022-03-02 DIAGNOSIS — Z6822 Body mass index (BMI) 22.0-22.9, adult: Secondary | ICD-10-CM | POA: Diagnosis not present

## 2022-03-02 DIAGNOSIS — U071 COVID-19: Secondary | ICD-10-CM | POA: Diagnosis not present

## 2022-03-02 DIAGNOSIS — N1831 Chronic kidney disease, stage 3a: Secondary | ICD-10-CM | POA: Diagnosis not present

## 2022-05-03 ENCOUNTER — Telehealth: Payer: Self-pay | Admitting: Cardiovascular Disease

## 2022-05-03 MED ORDER — PROPRANOLOL HCL 10 MG PO TABS: 10.0000 mg | ORAL_TABLET | Freq: Four times a day (QID) | ORAL | 9 refills | Status: DC | PRN

## 2022-05-03 NOTE — Telephone Encounter (Signed)
Pt c/o of Chest Pain: STAT if CP now or developed within 24 hours  1. Are you having CP right now? No   2. Are you experiencing any other symptoms (ex. SOB, nausea, vomiting, sweating)? No   3. How long have you been experiencing CP? 3-4 days it has gotten worse   4. Is your CP continuous or coming and going? Coming and going   5. Have you taken Nitroglycerin? No    Pt states she had covid and not sure if her symptoms are related to that. She states her chest pain is related to her mitral valve issue.  ?

## 2022-05-03 NOTE — Telephone Encounter (Signed)
Patient reports increased episodes of CP. She states it is the same as she has been treated for previously but more episodes and does improve with propanolol 10 mg as needed. She denies SOB and NV. She states she also takes Prilosec for reflux.  She had Covid in December and reports a difficult recovery.  She was diagnosed with Atypical CP in October of 2023. She was prescribed propanolol at that time. She states she also needs a refill for propanolol.   Scheduled with doctor of day for 05/05/21 and advised to go to ED for urgent evaluation for any worsening symptoms such as SOB, NV or additional symptoms.  Patient verbalized understanding and had no questions.

## 2022-05-05 ENCOUNTER — Ambulatory Visit: Payer: Medicare PPO | Admitting: Cardiology

## 2022-05-05 ENCOUNTER — Other Ambulatory Visit
Admission: RE | Admit: 2022-05-05 | Discharge: 2022-05-05 | Disposition: A | Discharge status: Home / Self Care | Payer: Medicare PPO | Source: Ambulatory Visit | Attending: Cardiology | Admitting: Cardiology

## 2022-05-05 ENCOUNTER — Encounter: Payer: Self-pay | Admitting: Cardiology

## 2022-05-05 VITALS — BP 106/68 | HR 68 | Ht 69.0 in | Wt 155.8 lb

## 2022-05-05 DIAGNOSIS — I341 Nonrheumatic mitral (valve) prolapse: Secondary | ICD-10-CM | POA: Diagnosis not present

## 2022-05-05 DIAGNOSIS — R079 Chest pain, unspecified: Secondary | ICD-10-CM

## 2022-05-05 DIAGNOSIS — Z8249 Family history of ischemic heart disease and other diseases of the circulatory system: Secondary | ICD-10-CM | POA: Diagnosis not present

## 2022-05-05 DIAGNOSIS — R0789 Other chest pain: Secondary | ICD-10-CM

## 2022-05-05 DIAGNOSIS — E785 Hyperlipidemia, unspecified: Secondary | ICD-10-CM | POA: Diagnosis not present

## 2022-05-05 LAB — BASIC METABOLIC PANEL
Anion gap: 8 (ref 5–15)
BUN: 27 mg/dL — ABNORMAL HIGH (ref 8–23)
CO2: 22 mmol/L (ref 22–32)
Calcium: 10.3 mg/dL (ref 8.9–10.3)
Chloride: 110 mmol/L (ref 98–111)
Creatinine, Ser: 1.17 mg/dL — ABNORMAL HIGH (ref 0.44–1.00)
GFR, Estimated: 47 mL/min — ABNORMAL LOW (ref >60)
Glucose, Bld: 104 mg/dL — ABNORMAL HIGH (ref 70–99)
Potassium: 4.6 mmol/L (ref 3.5–5.1)
Sodium: 140 mmol/L (ref 135–145)

## 2022-05-05 LAB — D-DIMER, QUANTITATIVE: D-Dimer, Quant: 1.38 ug/mL-FEU — ABNORMAL HIGH (ref 0.00–0.50)

## 2022-05-05 LAB — TROPONIN I (HIGH SENSITIVITY): Troponin I (High Sensitivity): 7 ng/L (ref <18)

## 2022-05-05 MED ORDER — METOPROLOL TARTRATE 100 MG PO TABS: 100.0000 mg | ORAL_TABLET | Freq: Once | ORAL | 0 refills | Status: DC

## 2022-05-05 NOTE — Progress Notes (Signed)
Patient advised by Dr. Radford Pax to go to [facility] ER as she reports chest pain for a week as lab is closed and Dr. Radford Pax had ordered BMET, D-Dimer and troponin to assess chest pain. Patient refused to go to ER, but agreed to take paper scripts over to admissions to obtain lab results as soon as possible. Paper scripts and wrap up document given to patient. Order for coronary CTA placed, staff will call patient to schedule follow up visit with Dr. Acie Fredrickson.

## 2022-05-05 NOTE — Addendum Note (Signed)
Addended by: Joni Reining on: 05/05/2022 05:14 PM   Modules accepted: Orders

## 2022-05-05 NOTE — Progress Notes (Signed)
Cardiology Office Note:    Date:  05/05/2022   ID:  Susan Nunez, DOB [birth year], MRN [id number]  PCP:  Cari Caraway, MD  Cardiologist:  Nahser  Electrophysiologist:  None   Referring MD: Cari Caraway, MD   No chief complaint on file.    Aug. 7, 2020    Susan Nunez is a 82 y.o. female with a hx of chest pain and MVP .  She was last seen by Dr. Acie Fredrickson back in 2023 at which time she was having some chest pain that was attributed to mitral valve prolapse.  Her pain gets worse with caffeine and stress.  It occurs more at night.  At her last Office visit with Dr. Acie Fredrickson nothing was done.  Her last 2D echo was in 2020 which revealed normal LV function and actually normal mitral valve.  She is seen today as a workin on DOD day for an episode of CP.  She started having CP a week ago off and on but never goes away completely that she describes as a discomfort that she has a hard time describing but says that it is sharp like a pinching sensation.  There is no associated nausea, diaphoresis or SOB.  There was some radiation into her back but nothing into her arm or back.  Currently on a scale of 1-10 the pain is 0.5/10 but when it first started it was an 8/10.  She says that it hurts worse when laying on her left side. She has not had any long trips recently.     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 EF at 65% -- mooth and normal coronary arteries -- Normal left ventricular systolic function.  We will continue with medical therapy.  I suspect her chest pain was due to her mitral valve prolapse syndrome --  Thayer Headings, M.D.    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   aspirin 81 MG tablet Take 81 mg by mouth  daily.     Cholecalciferol (VITAMIN D3) 2000 UNITS TABS Take 2,000 Units by mouth daily.   latanoprost (XALATAN) 0.005 % ophthalmic solution Place 1 drop into both eyes at bedtime.   lisinopril (PRINIVIL,ZESTRIL) 5 MG tablet Take 5 mg by mouth daily.     Multiple Vitamins-Minerals (EYE VITAMINS PO) Take 1 tablet by mouth daily.   Omega-3 Fatty Acids (FISH OIL) 1200 MG CAPS Take 1,200 mg by mouth 3 (three) times a week.   omeprazole (PRILOSEC OTC) 20 MG tablet Take 20 mg by mouth daily as needed (for heartburn).   propranolol (INDERAL) 10 MG tablet Take 1 tablet (10 mg total) by mouth 4 (four) times daily as needed. For palpitations.   risedronate (ACTONEL) 150 MG tablet Take 150 mg by mouth every 30 (thirty) days.    vitamin B-12 (CYANOCOBALAMIN) 1000 MCG tablet Take 1,000 mcg by mouth daily.   [DISCONTINUED] B Complex Vitamins (VITAMIN B COMPLEX PO) Take 1 tablet by  mouth daily.     Allergies:   Citalopram, Ciprofloxacin hcl, Demerol, Flagyl [metronidazole], Lovastatin, Penicillins, and Sertraline hcl   Social History   Socioeconomic History   Marital status: Widowed    Spouse name: Not on file   Number of children: Not on file   Years of education: Not on file   Highest education level: Not on file  Occupational History   Not on file  Tobacco Use   Smoking status: Never   Smokeless tobacco: Never  Vaping Use   Vaping Use: Never used  Substance and Sexual Activity   Alcohol use: No   Drug use: No   Sexual activity: Not on file  Other Topics Concern   Not on file  Social History Narrative   Not on file   Social Determinants of Health   Financial Resource Strain: Not on file  Food Insecurity: Not on file  Transportation Needs: Not on file  Physical Activity: Not on file  Stress: Not on file  Social Connections: Not on file     Family History: The patient's family history includes Coronary artery disease in her father; Heart attack in her father; Hypertension in her mother.  ROS:   Please see the history of present illness.     All other systems reviewed and are negative.  EKGs/Labs/Other Studies Reviewed:    The following studies were reviewed today:      Recent Labs: 08/05/2021: Creatinine, Ser 1.30  Recent Lipid Panel No results found for: "CHOL", "TRIG", "HDL", "CHOLHDL", "VLDL", "LDLCALC", "LDLDIRECT"  Physical Exam:    Physical Exam: Blood pressure 106/68, pulse 68, height '5\' 9"'$  (1.753 m), weight 155 lb 12.8 oz (70.7 kg), SpO2 94 %. GGEN: Well nourished, well developed in no acute distress HEENT: Normal NECK: No JVD; No carotid bruits LYMPHATICS: No lymphadenopathy CARDIAC:RRR, no murmurs, rubs, gallops RESPIRATORY:  Clear to auscultation without rales, wheezing or rhonchi  ABDOMEN: Soft, non-tender, non-distended MUSCULOSKELETAL:  No edema; No deformity  SKIN: Warm and dry NEUROLOGIC:  Alert and oriented  x 3 PSYCHIATRIC:  Normal affect   EKG:   NSR with PACs, LAD, LAFB ASSESSMENT:    1. Atypical chest pain   2. Mitral valve prolapse       PLAN:    Chest pain  -she carries a long history of atypical chest pain with a normal stress Myoview in 2009.  She has never had a coronary calcium score or coronary CTA done -It has been felt that her chest pain in the  past has been related to mitral valve prolapse although on her last echo in 2020 there was no evidence of mitral valve prolapse noted -now with sharp atypical CP that has been going on for a week somewhat constant but currently only a 0.5/10 in severity.  -check ddimer, Troponin and BMET today -Given her age and history of hypertension and hyperlipidemia I recommended getting a coronary CTA to define coronary anatomy. -Her last serum creatinine was 1.3 a year ago so we will check a bmet first  2.  Mitral valve prolapse -Her last 2D echo in 2020 showed a normal mitral valve with no prolapse and no MR -I do not hear a heart murmur on exam  Follow-up in 4 to 6 weeks with Dr. Acie Fredrickson  Medication Adjustments/Labs and Tests Ordered: Current medicines are reviewed at length with the patient today.  Concerns regarding medicines are outlined above.  Orders Placed This Encounter  Procedures   EKG 12-Lead    No orders of the defined types were placed in this encounter.    There are no Patient Instructions on file for this visit.   Signed, Fransico Him, MD  05/05/2022 4:32 PM    [facility]

## 2022-05-05 NOTE — Patient Instructions (Addendum)
Medication Instructions:  Your physician recommends that you continue on your current medications as directed. Please refer to the Current Medication list given to you today.  *If you need a refill on your cardiac medications before your next appointment, please call your pharmacy*   Lab Work: Please got to [facility] to complete a BMET, D-Dimer, and Troponin as soon as possible. You have been given paper scripts to take with you.   If you have labs (blood work) drawn today and your tests are completely normal, you will receive your results only by: [place] (if you have MyChart) OR A paper copy in the mail If you have any lab test that is abnormal or we need to change your treatment, we will call you to review the results.   Testing/Procedures:   Your cardiac CT will be scheduled at:   [facility] [address] [place],  [address] [phone]  Please arrive at the [facility] and Children's Entrance (Entrance C2) of [facility] 30 minutes prior to test start time. You can use the FREE valet parking offered at entrance C (encouraged to control the heart rate for the test)  Proceed to the [facility] Radiology Department (first floor) to check-in and test prep.  All radiology patients and guests should use entrance C2 at [facility], accessed from [facility], even though the hospital's physical address listed is [address]       Please follow these instructions carefully (unless otherwise directed):  Hold all erectile dysfunction medications at least 3 days (72 hrs) prior to test. (Ie viagra, cialis, sildenafil, tadalafil, etc) We will administer nitroglycerin during this exam.   On the Night Before the Test: Be sure to Drink plenty of water. Do not consume any caffeinated/decaffeinated beverages or chocolate 12 hours prior to your test. Do not take any antihistamines 12 hours prior to your  test.  On the Day of the Test: Drink plenty of water until 1 hour prior to the test. Do not eat any food 1 hour prior to test. You may take your regular medications prior to the test.  Take metoprolol 100 mg Lopressor two hours prior to test. If you take Furosemide/Hydrochlorothiazide/Spironolactone, please HOLD on the morning of the test. FEMALES- please wear underwire-free bra if available, avoid dresses & tight clothing   After the Test: Drink plenty of water. After receiving IV contrast, you may experience a mild flushed feeling. This is normal. On occasion, you may experience a mild rash up to 24 hours after the test. This is not dangerous. If this occurs, you can take Benadryl 25 mg and increase your fluid intake. If you experience trouble breathing, this can be serious. If it is severe call 911 IMMEDIATELY. If it is mild, please call our office. If you take any of these medications: Glipizide/Metformin, Avandament, Glucavance, please do not take 48 hours after completing test unless otherwise instructed.  We will call to schedule your test 2-4 weeks out understanding that some insurance companies will need an authorization prior to the service being performed.   For non-scheduling related questions, please contact the cardiac imaging nurse navigator should you have any questions/concerns: Marchia Bond, Cardiac Imaging Nurse Navigator Gordy Clement, Cardiac Imaging Nurse Navigator [facility] Direct Office Dial: [phone]   For scheduling needs, including cancellations and rescheduling, please call [place], [phone].    Follow-Up: At [facility], you and your health needs are  our priority.  As part of our continuing mission to provide you with exceptional heart care, we have created designated Provider Care Teams.  These Care Teams include your primary Cardiologist (physician) and Advanced Practice Providers (APPs -  Physician  Assistants and Nurse Practitioners) who all work together to provide you with the care you need, when you need it.  We recommend signing up for the patient portal called "MyChart".  Sign up information is provided on this After Visit Summary.  MyChart is used to connect with patients for Virtual Visits (Telemedicine).  Patients are able to view lab/test results, encounter notes, upcoming appointments, etc.  Non-urgent messages can be sent to your provider as well.   To learn more about what you can do with MyChart, go to [email or web address].    Your next appointment:   4 week(s)  Provider:   Mertie Moores, MD     Other Instructions You have been advised to present to the ER for evaluation and labwork due to concerns about your chest pain. Please proceed to [facility], which is across the street.

## 2022-05-08 ENCOUNTER — Emergency Department (HOSPITAL_COMMUNITY): Payer: Medicare PPO

## 2022-05-08 ENCOUNTER — Telehealth: Payer: Self-pay | Admitting: Cardiology

## 2022-05-08 ENCOUNTER — Encounter (HOSPITAL_COMMUNITY): Payer: Self-pay | Admitting: Emergency Medicine

## 2022-05-08 ENCOUNTER — Emergency Department (HOSPITAL_COMMUNITY)
Admission: EM | Admit: 2022-05-08 | Discharge: 2022-05-08 | Disposition: A | Discharge status: Home / Self Care | Payer: Medicare PPO | Attending: Emergency Medicine | Admitting: Emergency Medicine

## 2022-05-08 DIAGNOSIS — R091 Pleurisy: Secondary | ICD-10-CM | POA: Diagnosis not present

## 2022-05-08 DIAGNOSIS — R0609 Other forms of dyspnea: Secondary | ICD-10-CM | POA: Diagnosis not present

## 2022-05-08 DIAGNOSIS — Z7982 Long term (current) use of aspirin: Secondary | ICD-10-CM | POA: Insufficient documentation

## 2022-05-08 DIAGNOSIS — R079 Chest pain, unspecified: Secondary | ICD-10-CM | POA: Diagnosis not present

## 2022-05-08 LAB — CBC
HCT: 36.3 % (ref 36.0–46.0)
Hemoglobin: 11.7 g/dL — ABNORMAL LOW (ref 12.0–15.0)
MCH: 32.3 pg (ref 26.0–34.0)
MCHC: 32.2 g/dL (ref 30.0–36.0)
MCV: 100.3 fL — ABNORMAL HIGH (ref 80.0–100.0)
Platelets: 273 10*3/uL (ref 150–400)
RBC: 3.62 MIL/uL — ABNORMAL LOW (ref 3.87–5.11)
RDW: 14.4 % (ref 11.5–15.5)
WBC: 5.4 10*3/uL (ref 4.0–10.5)
nRBC: 0 % (ref 0.0–0.2)

## 2022-05-08 LAB — BASIC METABOLIC PANEL
Anion gap: 10 (ref 5–15)
BUN: 35 mg/dL — ABNORMAL HIGH (ref 8–23)
CO2: 22 mmol/L (ref 22–32)
Calcium: 9.9 mg/dL (ref 8.9–10.3)
Chloride: 107 mmol/L (ref 98–111)
Creatinine, Ser: 1.1 mg/dL — ABNORMAL HIGH (ref 0.44–1.00)
GFR, Estimated: 51 mL/min — ABNORMAL LOW (ref >60)
Glucose, Bld: 108 mg/dL — ABNORMAL HIGH (ref 70–99)
Potassium: 4.6 mmol/L (ref 3.5–5.1)
Sodium: 139 mmol/L (ref 135–145)

## 2022-05-08 LAB — TROPONIN I (HIGH SENSITIVITY)
Troponin I (High Sensitivity): 5 ng/L (ref <18)
Troponin I (High Sensitivity): 5 ng/L (ref <18)

## 2022-05-08 MED ORDER — IOHEXOL 350 MG/ML SOLN
75.0000 mL | Freq: Once | INTRAVENOUS | Status: AC | PRN
  Administered 2022-05-08: 75 mL via INTRAVENOUS

## 2022-05-08 MED ORDER — COLCHICINE 0.6 MG PO TABS: 0.6000 mg | ORAL_TABLET | Freq: Every day | ORAL | 0 refills | Status: DC

## 2022-05-08 NOTE — ED Notes (Signed)
Patient transported to CT 

## 2022-05-08 NOTE — Telephone Encounter (Signed)
The patient has been notified of the result and verbalized understanding.  All questions (if any) were answered. Bernestine Amass, RN 05/08/2022 5:06 PM  Patient will go to [facility] for CT scan.

## 2022-05-08 NOTE — Telephone Encounter (Signed)
Patient stated she is returning RN's call.

## 2022-05-08 NOTE — ED Triage Notes (Addendum)
Pt had sharp pain a week ago (states felt like bee sting) then it dulled and now has become a constant ache. She was  seen on Friday for mitral valve disease by cardiologist. She drew labs and dimer came back positive. She was called by office and told to come for ct angio. Currently having chest pain radiating into back. SOB upon exertion.

## 2022-05-08 NOTE — Telephone Encounter (Signed)
-----   Message from Sueanne Margarita, MD sent at 05/08/2022  8:10 AM EST ----- BMET stable.  DDIMER is elevated - please call her and have her go to [facility] for stat Chest CTA to rule out PE

## 2022-05-08 NOTE — Discharge Instructions (Addendum)
Please follow-up with Dr. Radford Pax within the week.  They will still need to do the coronary CT scan that they had planned as the CT scan today did not show any specific details about your heart.  Your blood work look good, your vital signs look good, your EKG looked good, the CT scan did not show any signs of blood clot!  Please take colchicine once a day for the next 10 days, let your family doctor and your cardiologist know.  ER for worsening symptoms

## 2022-05-08 NOTE — ED Provider Notes (Signed)
[facility] Provider Note   CSN: [id number] Arrival date & time: 05/08/22  1758     History  Chief Complaint  Patient presents with   Chest Pain    Susan Nunez is a 82 y.o. female.   Chest Pain    This patient is a 82 year old female, she has a history of mitral valve disease and is on propranolol 10 mg tablets, she is also takes metoprolol lisinopril and a baby aspirin.  She has never had obstructive heart disease, she reports having a heart catheterization over 10 years ago and had a stress test more recently both of which were unremarkable.  She has recently started to have some sharp left-sided chest pain over the last week and was seen by her cardiologist Dr. Golden Hurter who ordered some lab work, 1 of these test was a D-dimer that was elevated, her troponin was normal, her metabolic panel showed that her renal function was adequate for a CT scan so she recommended that she come to the hospital to get a CT angiogram to rule out pulmonary embolism.  The patient is not particular short of breath, she does have pain worse when she lays on her left side, there is no swelling of the legs, no recent travel or trauma, she did have abdominal surgery for resection of a small bowel approximately 6 months ago.  Home Medications Prior to Admission medications   Medication Sig Start Date End Date Taking? Authorizing Provider  colchicine 0.6 MG tablet Take 1 tablet (0.6 mg total) by mouth daily for 10 days. 05/08/22 05/18/22 Yes Noemi Chapel, MD  acidophilus (RISAQUAD) CAPS capsule Take 1 capsule by mouth daily.    [provider]  aspirin 81 MG tablet Take 81 mg by mouth daily.      [provider]  Cholecalciferol (VITAMIN D3) 2000 UNITS TABS Take 2,000 Units by mouth daily.    [provider]  latanoprost (XALATAN) 0.005 % ophthalmic solution Place 1 drop into both eyes at bedtime. 10/06/19   [provider]   lisinopril (PRINIVIL,ZESTRIL) 5 MG tablet Take 5 mg by mouth daily.      [provider]  metoprolol tartrate (LOPRESSOR) 100 MG tablet Take 1 tablet (100 mg total) by mouth once for 1 dose. Take 90-120 minutes prior to scan. 05/05/22 05/05/22  Sueanne Margarita, MD  Multiple Vitamins-Minerals (EYE VITAMINS PO) Take 1 tablet by mouth daily.    [provider]  Omega-3 Fatty Acids (FISH OIL) 1200 MG CAPS Take 1,200 mg by mouth 3 (three) times a week.    [provider]  omeprazole (PRILOSEC OTC) 20 MG tablet Take 20 mg by mouth daily as needed (for heartburn).    [provider]  propranolol (INDERAL) 10 MG tablet Take 1 tablet (10 mg total) by mouth 4 (four) times daily as needed. For palpitations. 05/03/22   Nahser, Wonda Cheng, MD  risedronate (ACTONEL) 150 MG tablet Take 150 mg by mouth every 30 (thirty) days.  09/05/19   [provider]  vitamin B-12 (CYANOCOBALAMIN) 1000 MCG tablet Take 1,000 mcg by mouth daily.    [provider]      Allergies    Citalopram, Ciprofloxacin hcl, Demerol, Flagyl [metronidazole], Lovastatin, Penicillins, and Sertraline hcl    Review of Systems   Review of Systems  Cardiovascular:  Positive for chest pain.  All other systems reviewed and are negative.   Physical Exam Updated Vital Signs  BP (!) 151/87 (BP Location: Right Arm)   Pulse 75   Temp 98.3 F (36.8 C) (Oral)   Resp 18   SpO2 96%  Physical Exam Vitals and nursing note reviewed.  Constitutional:      General: She is not in acute distress.    Appearance: She is well-developed.  HENT:     Head: Normocephalic and atraumatic.     Mouth/Throat:     Pharynx: No oropharyngeal exudate.  Eyes:     General: No scleral icterus.       Right eye: No discharge.        Left eye: No discharge.     Conjunctiva/sclera: Conjunctivae normal.     Pupils: Pupils are equal, round, and reactive to light.  Neck:     Thyroid: No thyromegaly.     Vascular: No  JVD.  Cardiovascular:     Rate and Rhythm: Normal rate and regular rhythm.     Heart sounds: Normal heart sounds. No murmur heard.    No friction rub. No gallop.  Pulmonary:     Effort: Pulmonary effort is normal. No respiratory distress.     Breath sounds: Normal breath sounds. No wheezing or rales.  Abdominal:     General: Bowel sounds are normal. There is no distension.     Palpations: Abdomen is soft. There is no mass.     Tenderness: There is no abdominal tenderness.  Musculoskeletal:        General: No tenderness. Normal range of motion.     Cervical back: Normal range of motion and neck supple.     Right lower leg: No tenderness. No edema.     Left lower leg: No tenderness. No edema.  Lymphadenopathy:     Cervical: No cervical adenopathy.  Skin:    General: Skin is warm and dry.     Findings: No erythema or rash.  Neurological:     Mental Status: She is alert.     Coordination: Coordination normal.  Psychiatric:        Behavior: Behavior normal.     ED Results / Procedures / Treatments   Labs (all labs ordered are listed, but only abnormal results are displayed) Labs Reviewed  CBC - Abnormal; Notable for the following components:      Result Value   RBC 3.62 (*)    Hemoglobin 11.7 (*)    MCV 100.3 (*)    All other components within normal limits  BASIC METABOLIC PANEL  TROPONIN I (HIGH SENSITIVITY)  TROPONIN I (HIGH SENSITIVITY)    EKG EKG Interpretation  Date/Time:  Monday May 08 2022 18:42:44 EST Ventricular Rate:  70 PR Interval:  184 QRS Duration: 86 QT Interval:  356 QTC Calculation: 384 R Axis:   -30 Text Interpretation: Normal sinus rhythm Left axis deviation Abnormal ECG When compared with ECG of 07-May-2017 15:31, PREVIOUS ECG IS PRESENT Confirmed by Noemi Chapel [phone]) on 05/08/2022 8:52:24 PM  Radiology CT Angio Chest PE W and/or Wo Contrast  Result Date: 05/08/2022 CLINICAL DATA:  Sharp chest pain 1 week ago, now dull ache, pain  radiating to back, dyspnea on exertion EXAM: CT ANGIOGRAPHY CHEST WITH CONTRAST TECHNIQUE: Multidetector CT imaging of the chest was performed using the standard protocol during bolus administration of intravenous contrast. Multiplanar CT image reconstructions and MIPs were obtained to evaluate the vascular anatomy. RADIATION DOSE REDUCTION: This exam was performed according to the departmental dose-optimization program which includes automated exposure control, adjustment of the mA  and/or kV according to patient size and/or use of iterative reconstruction technique. CONTRAST:  3m OMNIPAQUE IOHEXOL 350 MG/ML SOLN COMPARISON:  None Available. FINDINGS: Cardiovascular: This is a technically adequate evaluation of the pulmonary vasculature. No filling defects or pulmonary emboli. Cardiomegaly, with prominent biatrial dilatation. No pericardial effusion. No evidence of thoracic aortic aneurysm or dissection. Atherosclerosis of the aortic arch. Mediastinum/Nodes: No enlarged mediastinal, hilar, or axillary lymph nodes. Thyroid gland, trachea, and esophagus demonstrate no significant findings. Lungs/Pleura: No airspace disease, effusion, or pneumothorax. Central airways are patent. Upper Abdomen: No acute abnormality. Musculoskeletal: No acute or destructive bony lesions. Reconstructed images demonstrate no additional findings. Review of the MIP images confirms the above findings. IMPRESSION: 1. No evidence of pulmonary embolus. 2. Cardiomegaly. 3.  Aortic Atherosclerosis (ICD10-I70.0). Electronically Signed   By: MRanda NgoM.D.   On: 05/08/2022 22:11    Procedures Procedures    Medications Ordered in ED Medications  iohexol (OMNIPAQUE) 350 MG/ML injection 75 mL (75 mLs Intravenous Contrast Given 05/08/22 2136)    ED Course/ Medical Decision Making/ A&P                             Medical Decision Making Amount and/or Complexity of Data Reviewed Labs: ordered. Radiology:  ordered.  Risk Prescription drug management.   This patient presents to the ED for concern of chest pain differential diagnosis includes but Neri embolism, pleurisy, pericarditis, myocarditis, coronary artery disease though that seems less likely.    Additional history obtained:  Additional history obtained from patient and the family member at the bedside as well as the medical record External records from outside source obtained and reviewed including cardiology notes   Lab Tests:  I Ordered, and personally interpreted labs.  The pertinent results include: D-dimer elevated in the outpatient setting   Imaging Studies ordered:  I ordered imaging studies including CT angiogram I independently visualized and interpreted imaging which showed no signs of acute pulmonary embolism I agree with the radiologist interpretation   Medicines ordered and prescription drug management:  I ordered medication including colchicine for pain I have reviewed the patients home medicines and have made adjustments as needed   Problem List / ED Course:  Pleurisy likely, CT negative   Social Determinants of Health:  Discussed with family member and patient at the bedside, stable for discharge           Final Clinical Impression(s) / ED Diagnoses Final diagnoses:  Pleurisy    Rx / DC Orders ED Discharge Orders          Ordered    colchicine 0.6 MG tablet  Daily        05/08/22 2232              MNoemi Chapel MD 05/08/22 2233

## 2022-05-18 ENCOUNTER — Telehealth (HOSPITAL_COMMUNITY): Payer: Self-pay | Admitting: *Deleted

## 2022-05-18 NOTE — Telephone Encounter (Signed)
Patient calling about her upcoming cardiac imaging study; pt verbalizes understanding of appt date/time, parking situation and where to check in, pre-test NPO status and medications ordered, and verified current allergies; name and call back number provided for further questions should they arise  Gordy Clement RN Navigator Card[facility] [phone] office [phone] cell  Patient to hold her lisinopril today and tomorrow. She is to take '100mg'$  metoprolol tartrate two hours prior to her cardiac CT. She is aware to arrive at 9:30am.

## 2022-05-19 ENCOUNTER — Ambulatory Visit (HOSPITAL_COMMUNITY)
Admission: RE | Admit: 2022-05-19 | Discharge: 2022-05-19 | Disposition: A | Discharge status: Home / Self Care | Payer: Medicare PPO | Source: Ambulatory Visit | Attending: Cardiology | Admitting: Cardiology

## 2022-05-19 DIAGNOSIS — R7989 Other specified abnormal findings of blood chemistry: Secondary | ICD-10-CM | POA: Insufficient documentation

## 2022-05-19 DIAGNOSIS — K760 Fatty (change of) liver, not elsewhere classified: Secondary | ICD-10-CM | POA: Insufficient documentation

## 2022-05-19 DIAGNOSIS — R0789 Other chest pain: Secondary | ICD-10-CM | POA: Diagnosis not present

## 2022-05-19 DIAGNOSIS — M47894 Other spondylosis, thoracic region: Secondary | ICD-10-CM | POA: Diagnosis not present

## 2022-05-19 DIAGNOSIS — I700 Atherosclerosis of aorta: Secondary | ICD-10-CM | POA: Diagnosis not present

## 2022-05-19 DIAGNOSIS — R079 Chest pain, unspecified: Secondary | ICD-10-CM | POA: Diagnosis not present

## 2022-05-19 MED ORDER — NITROGLYCERIN 0.4 MG SL SUBL
SUBLINGUAL_TABLET | SUBLINGUAL | Status: AC
  Filled 2022-05-19: qty 2

## 2022-05-19 MED ORDER — METOPROLOL TARTRATE 5 MG/5ML IV SOLN
10.0000 mg | Freq: Once | INTRAVENOUS | Status: AC
  Administered 2022-05-19: 10 mg via INTRAVENOUS

## 2022-05-19 MED ORDER — IOHEXOL 350 MG/ML SOLN
100.0000 mL | Freq: Once | INTRAVENOUS | Status: AC | PRN
  Administered 2022-05-19: 100 mL via INTRAVENOUS

## 2022-05-19 MED ORDER — NITROGLYCERIN 0.4 MG SL SUBL
0.8000 mg | SUBLINGUAL_TABLET | Freq: Once | SUBLINGUAL | Status: AC
  Administered 2022-05-19: 0.8 mg via SUBLINGUAL

## 2022-05-19 MED ORDER — METOPROLOL TARTRATE 5 MG/5ML IV SOLN
INTRAVENOUS | Status: AC
  Filled 2022-05-19: qty 10

## 2022-05-25 ENCOUNTER — Telehealth: Payer: Self-pay

## 2022-05-25 NOTE — Telephone Encounter (Signed)
Patient is returning call.  °

## 2022-05-25 NOTE — Telephone Encounter (Signed)
Called patient to review CT scan of heart arteries. Patient verbalizes understanding that there is no evidence of coronary artery disease.

## 2022-05-25 NOTE — Telephone Encounter (Signed)
Called to review CT score results with patient, no answer, not able to leave voice mail per DPR. Left message with no identifying info asking patient to call office.

## 2022-05-25 NOTE — Telephone Encounter (Signed)
Called patient to give results of Coronary Calcium score CT. Patient verbalizes understanding of low risk score, but has questions about recent diagnosis of pleurisy, she was diagnosed with this at [facility] during stay starting on 05/08/22. She states she took colchicine as ordered when she was discharged from [facility] which did improve her chest discomfort but did not fully resolve it. Patient denies SOB or active chest pain at this time, states her chest discomfort comes and goes. I advised patient to follow up with PCP for work-up/treatment of her pleurisy found on her ED Visit 05/05/22. Patient has follow up visit with Dr. Acie Fredrickson in May. Forwarded to Dr. Acie Fredrickson and his nurse.

## 2022-05-25 NOTE — Telephone Encounter (Signed)
-----   Message from Freada Bergeron, MD sent at 05/19/2022  3:19 PM EST ----- Her CT scan of her heart arteries looks great with no evidence of heart artery disease. Ca score is 0. This is great news!

## 2022-05-29 NOTE — Telephone Encounter (Signed)
Called patient to share Dr. Elmarie Shiley advice that patient not get any further COVID boosters - especially since she has had COVID and will have natural immunity from that infection .  Those antibodies  will be much more effective than additional COVID boosters at this point. Patient verbalizes understanding.

## 2022-05-29 NOTE — Telephone Encounter (Signed)
Called patient to discuss Dr. Elmarie Shiley advice that her pleuritic chest pain will take some time to improve but colchicine and NSAIDS should help. Patient states that her chest discomfort has gotten better since we last spoke. I also asked patient if onset of pleuritic chest pain was preceded by any viral infection or covid booster shots. Patient states she has had several covid vaccines, but she contracted covid in December and it was after that bout of Covid that her pleuritic chest pain began.   Patient states she is feeling better and feels she can wait until her appointment with Dr. Acie Fredrickson in May for further evaluation. I advised patient to call the office if any symptoms worsen, patient verbalizes understanding.

## 2022-06-29 DIAGNOSIS — Z6823 Body mass index (BMI) 23.0-23.9, adult: Secondary | ICD-10-CM | POA: Diagnosis not present

## 2022-06-29 DIAGNOSIS — R0602 Shortness of breath: Secondary | ICD-10-CM | POA: Diagnosis not present

## 2022-06-29 DIAGNOSIS — T7840XA Allergy, unspecified, initial encounter: Secondary | ICD-10-CM | POA: Diagnosis not present

## 2022-06-29 DIAGNOSIS — H6120 Impacted cerumen, unspecified ear: Secondary | ICD-10-CM | POA: Diagnosis not present

## 2022-06-30 ENCOUNTER — Ambulatory Visit (HOSPITAL_COMMUNITY)
Admission: RE | Admit: 2022-06-30 | Discharge: 2022-06-30 | Disposition: A | Discharge status: Home / Self Care | Payer: Medicare PPO | Source: Ambulatory Visit | Attending: Pain Medicine | Admitting: Pain Medicine

## 2022-06-30 ENCOUNTER — Other Ambulatory Visit (HOSPITAL_COMMUNITY): Payer: Self-pay | Admitting: Pain Medicine

## 2022-06-30 DIAGNOSIS — J449 Chronic obstructive pulmonary disease, unspecified: Secondary | ICD-10-CM | POA: Diagnosis not present

## 2022-06-30 DIAGNOSIS — R0602 Shortness of breath: Secondary | ICD-10-CM | POA: Insufficient documentation

## 2022-06-30 DIAGNOSIS — J439 Emphysema, unspecified: Secondary | ICD-10-CM | POA: Diagnosis not present

## 2022-06-30 DIAGNOSIS — I10 Essential (primary) hypertension: Secondary | ICD-10-CM | POA: Diagnosis not present

## 2022-06-30 DIAGNOSIS — R059 Cough, unspecified: Secondary | ICD-10-CM | POA: Diagnosis not present

## 2022-07-07 DIAGNOSIS — H5203 Hypermetropia, bilateral: Secondary | ICD-10-CM | POA: Diagnosis not present

## 2022-07-07 DIAGNOSIS — H353132 Nonexudative age-related macular degeneration, bilateral, intermediate dry stage: Secondary | ICD-10-CM | POA: Diagnosis not present

## 2022-07-07 DIAGNOSIS — Z961 Presence of intraocular lens: Secondary | ICD-10-CM | POA: Diagnosis not present

## 2022-07-07 DIAGNOSIS — Z9849 Cataract extraction status, unspecified eye: Secondary | ICD-10-CM | POA: Diagnosis not present

## 2022-07-07 DIAGNOSIS — H401131 Primary open-angle glaucoma, bilateral, mild stage: Secondary | ICD-10-CM | POA: Diagnosis not present

## 2022-07-07 DIAGNOSIS — H53143 Visual discomfort, bilateral: Secondary | ICD-10-CM | POA: Diagnosis not present

## 2022-07-20 ENCOUNTER — Ambulatory Visit: Payer: Medicare PPO | Admitting: Internal Medicine

## 2022-07-20 ENCOUNTER — Encounter: Payer: Self-pay | Admitting: Internal Medicine

## 2022-07-20 ENCOUNTER — Other Ambulatory Visit: Payer: Self-pay | Admitting: Internal Medicine

## 2022-07-20 VITALS — BP 126/74 | HR 76 | Temp 97.7°F | Ht 69.0 in | Wt 157.0 lb

## 2022-07-20 DIAGNOSIS — J301 Allergic rhinitis due to pollen: Secondary | ICD-10-CM | POA: Diagnosis not present

## 2022-07-20 DIAGNOSIS — J4530 Mild persistent asthma, uncomplicated: Secondary | ICD-10-CM

## 2022-07-20 DIAGNOSIS — K219 Gastro-esophageal reflux disease without esophagitis: Secondary | ICD-10-CM | POA: Diagnosis not present

## 2022-07-20 LAB — POCT EXHALED NITRIC OXIDE: FeNO level (ppb): 41

## 2022-07-20 MED ORDER — FLUTICASONE PROPIONATE HFA 110 MCG/ACT IN AERO: 1.0000 | INHALATION_SPRAY | Freq: Two times a day (BID) | RESPIRATORY_TRACT | 12 refills | Status: DC

## 2022-07-20 MED ORDER — AZELASTINE HCL 0.1 % NA SOLN: 1.0000 | Freq: Two times a day (BID) | NASAL | 12 refills | Status: DC

## 2022-07-20 NOTE — Patient Instructions (Addendum)
Please schedule follow up scheduled with myself in 2 months.  If my schedule is not open yet, we will contact you with a reminder closer to that time. Please call [phone] if you haven't heard from [place] a month before.   Reflux: Increase prilosec to 40 mg until we meet again. Let's see if that helps the burning in your chest.   Asthma Maintenance inhaler: Start flovent(fluticasone) inhaler 1 puff once daily. Gargle after use.   Rescue inhaler: Take the albuterol rescue inhaler every 4 to 6 hours as needed for wheezing or shortness of breath. You can also take it 15 minutes before exercise or exertional activity. Side effects include heart racing or pounding, jitters or anxiety. If you have a history of an irregular heart rhythm, it can make this worse. Can also give some patients a hard time sleeping.  Allergies: Continue the claritin. Add astelin nasal spray.   Astelin - 1 spray on each side of your nose twice a day for first week, then 1 spray on each side.   Instructions for use: If you also use a saline nasal spray or rinse, use that first. Position the head with the chin slightly tucked. Use the right hand to spray into the left nostril and the right hand to spray into the left nostril.   Point the bottle away from the septum of your nose (cartilage that divides the two sides of your nose).  Hold the nostril closed on the opposite side from where you will spray Spray once and gently sniff to pull the medicine into the higher parts of your nose.  Don't sniff too hard as the medicine will drain down the back of your throat instead. Repeat with a second spray on the same side if prescribed. Repeat on the other side of your nose.  By learning about asthma and how it can be controlled, you take an important step toward managing this disease. Work closely with your asthma care team to learn all you can about your asthma, how to avoid triggers, what your medications do, and how to take them  correctly. With proper care, you can live free of asthma symptoms and maintain a normal, healthy lifestyle.   What is asthma? Asthma is a chronic disease that affects the airways of the lungs. During normal breathing, the bands of muscle that surround the airways are relaxed and air moves freely. During an asthma episode or "attack," there are three main changes that stop air from moving easily through the airways: The bands of muscle that surround the airways tighten and make the airways narrow. This tightening is called bronchospasm.  The lining of the airways becomes swollen or inflamed.  The cells that line the airways produce more mucus, which is thicker than normal and clogs the airways.  These three factors - bronchospasm, inflammation, and mucus production - cause symptoms such as difficulty breathing, wheezing, and coughing.  What are the most common symptoms of asthma? Asthma symptoms are not the same for everyone. They can even change from episode to episode in the same person. Also, you may have only one symptom of asthma, such as cough, but another person may have all the symptoms of asthma. It is important to know all the symptoms of asthma and to be aware that your asthma can present in any of these ways at any time. The most common symptoms include: Coughing, especially at night  Shortness of breath  Wheezing  Chest tightness, pain, or pressure  Who is affected by asthma? Asthma affects 22 million Americans; about 6 million of these are children under age 54. People who have a family history of asthma have an increased risk of developing the disease. Asthma is also more common in people who have allergies or who are exposed to tobacco smoke. However, anyone can develop asthma at any time. Some people may have asthma all of their lives, while others may develop it as adults.  What causes asthma? The airways in a person with asthma are very sensitive and react to many things, or  "triggers." Contact with these triggers causes asthma symptoms. One of the most important parts of asthma control is to identify your triggers and then avoid them when possible. The only trigger you do not want to avoid is exercise. Pre-treatment with medicines before exercise can allow you to stay active yet avoid asthma symptoms. Common asthma triggers include: Infections (colds, viruses, flu, sinus infections)  Exercise  Weather (changes in temperature and/or humidity, cold air)  Tobacco smoke  Allergens (dust mites, pollens, pets, mold spores, cockroaches, and sometimes foods)  Irritants (strong odors from cleaning products, perfume, wood smoke, air pollution)  Strong emotions such as crying or laughing hard  Some medications   How is asthma diagnosed? To diagnose asthma, your doctor will first review your medical history, family history, and symptoms. Your doctor will want to know any past history of breathing problems you may have had, as well as a family history of asthma, allergies, eczema (a bumpy, itchy skin rash caused by allergies), or other lung disease. It is important that you describe your symptoms in detail (cough, wheeze, shortness of breath, chest tightness), including when and how often they occur. The doctor will perform a physical examination and listen to your heart and lungs. He or she may also order breathing tests, allergy tests, blood tests, and chest and sinus X-rays. The tests will find out if you do have asthma and if there are any other conditions that are contributing factors.  How is asthma treated? Asthma can be controlled, but not cured. It is not normal to have frequent symptoms, trouble sleeping, or trouble completing tasks. Appropriate asthma care will prevent symptoms and visits to the emergency room and hospital. Asthma medicines are one of the mainstays of asthma treatment. The drugs used to treat asthma are explained below.  Anti-inflammatories: These are  the most important drugs for most people with asthma. Anti-inflammatory drugs reduce swelling and mucus production in the airways. As a result, airways are less sensitive and less likely to react to triggers. These medications need to be taken daily and may need to be taken for several weeks before they begin to control asthma. Anti-inflammatory medicines lead to fewer symptoms, better airflow, less sensitive airways, less airway damage, and fewer asthma attacks. If taken every day, they CONTROL or prevent asthma symptoms.   Bronchodilators: These drugs relax the muscle bands that tighten around the airways. This action opens the airways, letting more air in and out of the lungs and improving breathing. Bronchodilators also help clear mucus from the lungs. As the airways open, the mucus moves more freely and can be coughed out more easily. In short-acting forms, bronchodilators RELIEVE or stop asthma symptoms by quickly opening the airways and are very helpful during an asthma episode. In long-acting forms, bronchodilators provide CONTROL of asthma symptoms and prevent asthma episodes.  Asthma drugs can be taken in a variety of ways. Inhaling the medications by using  a metered dose inhaler, dry powder inhaler, or nebulizer is one way of taking asthma medicines. Oral medicines (pills or liquids you swallow) may also be prescribed.  Asthma severity Asthma is classified as either "intermittent" (comes and goes) or "persistent" (lasting). Persistent asthma is further described as being mild, moderate, or severe. The severity of asthma is based on how often you have symptoms both during the day and night, as well as by the results of lung function tests and by how well you can perform activities. The "severity" of asthma refers to how "intense" or "strong" your asthma is.  Asthma control Asthma control is the goal of asthma treatment. Regardless of your asthma severity, it may or may not be controlled. Asthma  control means: You are able to do everything you want to do at work and home  You have no (or minimal) asthma symptoms  You do not wake up from your sleep or earlier than usual in the morning due to asthma  You rarely need to use your reliever medicine (inhaler)  Another major part of your treatment is that you are happy with your asthma care and believe your asthma is controlled.  Monitoring symptoms A key part of treatment is keeping track of how well your lungs are working. Monitoring your symptoms, what they are, how and when they happen, and how severe they are, is an important part of being able to control your asthma.  Sometimes asthma is monitored using a peak flow meter. A peak flow (PF) meter measures how fast the air comes out of your lungs. It can help you know when your asthma is getting worse, sometimes even before you have symptoms. By taking daily peak flow readings, you can learn when to adjust medications to keep asthma under good control. It is also used to create your asthma action plan (see below). Your doctor can use your peak flow readings to adjust your treatment plan in some cases.  Asthma Action Plan Based on your history and asthma severity, you and your doctor will develop a care plan called an "asthma action plan." The asthma action plan describes when and how to use your medicines, actions to take when asthma worsens, and when to seek emergency care. Make sure you understand this plan. If you do not, ask your asthma care provider any questions you may have. Your asthma action plan is one of the keys to controlling asthma. Keep it readily available to remind you of what you need to do every day to control asthma and what you need to do when symptoms occur.  Goals of asthma therapy These are the goals of asthma treatment: Live an active, normal life  Prevent chronic and troublesome symptoms  Attend work or school every day  Perform daily activities without difficulty   Stop urgent visits to the doctor, emergency department, or hospital  Use and adjust medications to control asthma with few or no side effects
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
## 2022-07-24 ENCOUNTER — Telehealth: Payer: Self-pay | Admitting: Internal Medicine

## 2022-07-24 ENCOUNTER — Other Ambulatory Visit (HOSPITAL_COMMUNITY): Payer: Self-pay

## 2022-07-24 MED ORDER — FLUTICASONE-SALMETEROL 230-21 MCG/ACT IN AERO: 2.0000 | INHALATION_SPRAY | Freq: Two times a day (BID) | RESPIRATORY_TRACT | 12 refills | Status: DC

## 2022-07-24 NOTE — Telephone Encounter (Signed)
Spoke with patient. Advised PA is not needed for Advair it is currently covered by insurance. Patient states she will call them back tomorrow and verify. NFN

## 2022-07-24 NOTE — Telephone Encounter (Signed)
Per test claims Brand Advair HFA is covered for $40.00

## 2022-07-24 NOTE — Telephone Encounter (Signed)
Pt called the office stating that the Flovent inhaler that Dr. Celine Mans prescribed is no longer available.  She said her insurance provided her names of inhalers that could be prescribed in place of the Flovent but these would require a prior authorization in order to try to get cost of inhalers down for tier exception.   The inhalers she said could be prescribed in place of the Flovent are Arnuity, Advair HFA, Symbicort. Pt said she would prefer not to have a powder inhaler prescribed.  Dr. Celine Mans, please advise on this for pt.

## 2022-07-24 NOTE — Telephone Encounter (Signed)
Now routing to prior auth team to get the prior auth addressed.

## 2022-07-24 NOTE — Telephone Encounter (Signed)
Medication does not need a PA as it is covered.

## 2022-07-24 NOTE — Telephone Encounter (Signed)
Pt calling in bc the pharmacy informed her that they aren't able to fulfill her prescription due to Flovent is no longer being used. Pt is wanted to know what she can use instead and to make sure it is a generic brand

## 2022-07-24 NOTE — Telephone Encounter (Signed)
PA team can you please advise. Patient is stating Advair is needing a PA. Can you please advise what the cost of the Advair and the other inhaler below would be

## 2022-07-24 NOTE — Telephone Encounter (Signed)
See previous note

## 2022-07-24 NOTE — Telephone Encounter (Signed)
Per request from pharmacy, Arnuity is preferred. Test claim shows co-pay of $40.00

## 2022-07-24 NOTE — Telephone Encounter (Signed)
Patient is saying a PA will make prescription cheaper?

## 2022-07-24 NOTE — Telephone Encounter (Signed)
Can you please run ticket on insurance to see what is covered?  Route message back to triage

## 2022-07-25 NOTE — Telephone Encounter (Signed)
Refer to phone encounter from 5/13

## 2022-07-26 ENCOUNTER — Encounter: Payer: Self-pay | Admitting: Cardiovascular Disease
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
## 2022-07-27 ENCOUNTER — Ambulatory Visit: Payer: Medicare PPO | Attending: Cardiovascular Disease | Admitting: Cardiovascular Disease

## 2022-07-27 ENCOUNTER — Encounter: Payer: Self-pay | Admitting: Cardiovascular Disease

## 2022-07-27 VITALS — BP 134/86 | HR 70 | Ht 69.0 in | Wt 159.0 lb

## 2022-07-27 DIAGNOSIS — R0789 Other chest pain: Secondary | ICD-10-CM

## 2022-07-27 NOTE — Patient Instructions (Signed)
Medication Instructions:  Your physician recommends that you continue on your current medications as directed. Please refer to the Current Medication list given to you today.  *If you need a refill on your cardiac medications before your next appointment, please call your pharmacy*   Lab Work: NONE If you have labs (blood work) drawn today and your tests are completely normal, you will receive your results only by: MyChart Message (if you have MyChart) OR A paper copy in the mail If you have any lab test that is abnormal or we need to change your treatment, we will call you to review the results.   Testing/Procedures: NONE   Follow-Up: At [facility], you and your health needs are our priority.  As part of our continuing mission to provide you with exceptional heart care, we have created designated Provider Care Teams.  These Care Teams include your primary Cardiologist (physician) and Advanced Practice Providers (APPs -  Physician Assistants and Nurse Practitioners) who all work together to provide you with the care you need, when you need it.  We recommend signing up for the patient portal called "MyChart".  Sign up information is provided on this After Visit Summary.  MyChart is used to connect with patients for Virtual Visits (Telemedicine).  Patients are able to view lab/test results, encounter notes, upcoming appointments, etc.  Non-urgent messages can be sent to your provider as well.   To learn more about what you can do with MyChart, go to [email or web address].    Your next appointment:   1 year(s)  Provider:   Philip Nahser, MD      

## 2022-07-28 ENCOUNTER — Telehealth: Payer: Self-pay | Admitting: Internal Medicine

## 2022-07-28 NOTE — Telephone Encounter (Signed)
PT recently saw Dr. Celine Mans who Rx'd Advair. Pharm said that because she is on Albuterol as well Dr. Algis Downs may want to cut back the dosage. Please call pt to advise what directions she should follow, Pharm or Dr.  [phone]

## 2022-08-01 NOTE — Telephone Encounter (Signed)
Re-Routing to Triage due to no reply.

## 2022-08-03 NOTE — Telephone Encounter (Signed)
Dr. Celine Mans, please advise on message from front staff about the Advair if you still want to keep dose as is with the directions provided.

## 2022-08-03 NOTE — Telephone Encounter (Signed)
Please take advair as prescribed

## 2022-08-04 NOTE — Telephone Encounter (Signed)
Pt calling for Paige. Ret her call, she said. Please call back. TY.

## 2022-08-04 NOTE — Telephone Encounter (Signed)
Discontinue Advair and send in Symbicort 2 puffs twice daily. Looks like this is covered in placed of flovent from a previous telephone note. Advise she rinse mouth well after use.

## 2022-08-04 NOTE — Telephone Encounter (Signed)
Pharmacy team can you please advise on cost for Symbicort? Brand and generic price?

## 2022-08-04 NOTE — Telephone Encounter (Signed)
Called and spoke with pt letting her know the info per Dr. Celine Mans.  Pt verbalized understanding but said that she has been having side effects from the Advair since she first started taking it. She said she has been having hoarseness, sore throat, dry cough. Due to this, she wants to know if she should continue taking the Advair as prescribed still or if something else might need to be prescribed in it's place.  With Dr. Celine Mans not showing up on [place], routing to provider of the day. Beth, please advise.

## 2022-08-04 NOTE — Telephone Encounter (Signed)
Patient is requesting a tier exception to be sent to insurance for Advair. Was a PA completed for this before?

## 2022-08-08 ENCOUNTER — Other Ambulatory Visit (HOSPITAL_COMMUNITY): Payer: Self-pay

## 2022-08-08 NOTE — Telephone Encounter (Signed)
Advair HFA that patient currently has had a co-pay of $40.00 and can be filled again on 6-3, if desired. For reference, cash price for this medicine is $564.05.  Symbicort generic is not covered. Brand has a co-pay of $40.00. For reference, cash price for this medicine is $305.20.

## 2022-08-14 ENCOUNTER — Other Ambulatory Visit (HOSPITAL_COMMUNITY): Payer: Self-pay | Admitting: Family Medicine

## 2022-08-14 DIAGNOSIS — Z1231 Encounter for screening mammogram for malignant neoplasm of breast: Secondary | ICD-10-CM

## 2022-08-16 DIAGNOSIS — M810 Age-related osteoporosis without current pathological fracture: Secondary | ICD-10-CM | POA: Diagnosis not present

## 2022-08-16 DIAGNOSIS — E782 Mixed hyperlipidemia: Secondary | ICD-10-CM | POA: Diagnosis not present

## 2022-08-24 DIAGNOSIS — Z0000 Encounter for general adult medical examination without abnormal findings: Secondary | ICD-10-CM | POA: Diagnosis not present

## 2022-08-24 DIAGNOSIS — I700 Atherosclerosis of aorta: Secondary | ICD-10-CM | POA: Diagnosis not present

## 2022-08-24 DIAGNOSIS — Z1331 Encounter for screening for depression: Secondary | ICD-10-CM | POA: Diagnosis not present

## 2022-08-24 DIAGNOSIS — M810 Age-related osteoporosis without current pathological fracture: Secondary | ICD-10-CM | POA: Diagnosis not present

## 2022-08-24 DIAGNOSIS — E782 Mixed hyperlipidemia: Secondary | ICD-10-CM | POA: Diagnosis not present

## 2022-08-24 DIAGNOSIS — Z23 Encounter for immunization: Secondary | ICD-10-CM | POA: Diagnosis not present

## 2022-08-24 DIAGNOSIS — N1831 Chronic kidney disease, stage 3a: Secondary | ICD-10-CM | POA: Diagnosis not present

## 2022-08-24 DIAGNOSIS — I10 Essential (primary) hypertension: Secondary | ICD-10-CM | POA: Diagnosis not present

## 2022-08-24 DIAGNOSIS — F411 Generalized anxiety disorder: Secondary | ICD-10-CM | POA: Diagnosis not present

## 2022-08-24 NOTE — Telephone Encounter (Signed)
Spoke with the pt and notified of response per pharm team  Pt states she will get the advair refilled  Nothing further needed

## 2022-08-24 NOTE — Telephone Encounter (Signed)
Please call PT to advise.

## 2022-08-28 ENCOUNTER — Ambulatory Visit (HOSPITAL_COMMUNITY): Payer: Medicare PPO

## 2022-09-08 ENCOUNTER — Ambulatory Visit (HOSPITAL_COMMUNITY)
Admission: RE | Admit: 2022-09-08 | Discharge: 2022-09-08 | Disposition: A | Discharge status: Home / Self Care | Payer: Medicare PPO | Source: Ambulatory Visit | Attending: Family Medicine | Admitting: Family Medicine

## 2022-09-08 DIAGNOSIS — Z1231 Encounter for screening mammogram for malignant neoplasm of breast: Secondary | ICD-10-CM | POA: Diagnosis not present

## 2022-10-03 ENCOUNTER — Ambulatory Visit: Payer: Medicare PPO | Admitting: Internal Medicine

## 2022-10-03 ENCOUNTER — Encounter: Payer: Self-pay | Admitting: Internal Medicine

## 2022-10-03 VITALS — BP 118/66 | HR 77 | Ht 69.0 in | Wt 164.0 lb

## 2022-10-03 DIAGNOSIS — J301 Allergic rhinitis due to pollen: Secondary | ICD-10-CM | POA: Diagnosis not present

## 2022-10-03 DIAGNOSIS — J4530 Mild persistent asthma, uncomplicated: Secondary | ICD-10-CM | POA: Diagnosis not present

## 2022-10-03 LAB — NITRIC OXIDE: Nitric Oxide: 32

## 2022-10-03 MED ORDER — ALBUTEROL SULFATE HFA 108 (90 BASE) MCG/ACT IN AERS: 2.0000 | INHALATION_SPRAY | RESPIRATORY_TRACT | 5 refills | Status: AC | PRN

## 2022-10-03 NOTE — Progress Notes (Signed)
The patient has been prescribed the inhaler advair with spacer. Inhaler technique was demonstrated to patient. The patient subsequently demonstrated correct technique.

## 2022-10-03 NOTE — Progress Notes (Signed)
Susan Nunez    [id number]    11/09/1940  Primary Care Physician:McNeill, Toniann Fail, MD Date of Appointment: 10/03/2022 Established Patient Visit  Chief complaint:   Chief Complaint  Patient presents with   Follow-up    CP has resolved. Her breathing has improved some. She has some SOB with exertion occ. She uses the albuterol once per wk or less. She has some sore throat and hoarseness. She has noticed blurred vision for a few hours after using advair. Nitric oxide down to 32 from 41 at last visit.      HPI: Susan Nunez is a 82 y.o. woman with moderate persistent asthma.   Interval Updates: Started on advair. Minimal albuterol. Improvement in symptoms.   Chest pain improved when she started nexium.   Astelin dries up her nose so she stopped taking it. Allergies doing ok  Having some blurry vision with starting advair.   Asking for handicap placard.   Current Regimen: advair, prn albuterol Asthma Triggers: seasonal allergies, changes in temperature, smoke, strong smells Exacerbations in the last year: once requiring prednisone History of hospitalization or intubation: never Allergy Testing: never had.  GERD: prilosec Allergic Rhinitis: yes on claritin ACT:  Asthma Control Test ACT Total Score  10/03/2022  8:58 AM 21   FeNO: 41 ppb --->32 ppb  I have reviewed the patient's family social and past medical history and updated as appropriate.   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 EF at 65% -- mooth and normal coronary arteries -- Normal left ventricular systolic function.  We will continue with medical therapy.  I suspect her chest pain was due to her mitral valve prolapse syndrome --  Vesta Mixer, M.D.    CHOLECYSTECTOMY N/A 01/15/2020   Procedure: LAPAROSCOPIC CHOLECYSTECTOMY WITH INTRAOPERATIVE CHOLANGIOGRAM;  Surgeon: Darnell Level, MD;  Location: [facility];  Service: General;  Laterality: N/A;   FOOT SURGERY  07/12/00   right   JOINT REPLACEMENT     KNEE ARTHROSCOPY  06/14/2009   LUMBAR SPINE SURGERY     lumbar spine fusion L5-S1 performed 20 years ago x 3   TOTAL KNEE ARTHROPLASTY Right 11/04/2014   Procedure: RIGHT TOTAL KNEE ARTHROPLASTY;  Surgeon: Nadara Mustard, MD;  Location: [facility];  Service: Orthopedics;  Laterality: Right;   TOTAL VAGINAL HYSTERECTOMY  12/18/2001    Family History  Problem Relation Age of Onset   Hypertension Mother    Coronary artery disease Father    Heart attack Father    Asthma Grandson    Allergic Disorder Grandson     Social History   Occupational History   Not  on file  Tobacco Use   Smoking status: Never   Smokeless tobacco: Never  Vaping Use   Vaping status: Never Used  Substance and Sexual Activity   Alcohol use: No   Drug use: No   Sexual activity: Not on file     Physical Exam: Blood pressure 118/66, pulse 77, height 5\' 9"  (1.753 m), weight 164 lb (74.4 kg), SpO2 99%.  Gen:      No acute distress ENT:  no nasal polyps, mucus membranes moist Lungs:    No increased respiratory effort, symmetric chest wall excursion, clear to auscultation bilaterally, no wheezes or crackles CV:         Regular rate and rhythm; no murmurs, rubs, or gallops.  No pedal edema   Data Reviewed: Imaging: I have personally reviewed the chest xray April  2024 shows hyperinflation  PFTs:      No data to display          Labs: Lab Results  Component Value Date   NA 139 05/08/2022   K 4.6 05/08/2022   CO2 22 05/08/2022   GLUCOSE 108 (H) 05/08/2022   BUN 35 (H) 05/08/2022   CREATININE 1.10 (H) 05/08/2022   CALCIUM 9.9 05/08/2022   GFRNONAA 51 (L) 05/08/2022   Lab Results  Component Value Date   WBC 5.4 05/08/2022   HGB 11.7 (L) 05/08/2022   HCT 36.3 05/08/2022   MCV 100.3 (H) 05/08/2022   PLT 273 05/08/2022      Immunization status: Immunization History  Administered Date(s) Administered   Influenza, High Dose Seasonal PF 12/14/2017, 11/02/2018   PNEUMOCOCCAL CONJUGATE-20 08/24/2022   Zoster Recombinant(Shingrix) 11/16/2018    External Records Personally Reviewed:   Assessment:  Moderate persistent asthma, improved control Chest pressure related to GERD - improved control Allergic rhinitis, controlled.   Plan/Recommendations: Use advair 2 puffs once daily with the spacer. We are reducing the dosage due to your blurry vision.  Gargle after use. 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   You can stop the astelin if it is drying out your nose. Let me know if you want to try another medication for allergies.   Continue the prilosec because it is helping your reflux which was causing chest pressure.   Handicap placard form given today.    Return to Care: Return in about 4 months (around 02/03/2023).   Durel Salts, MD Pulmonary and Critical Care Medicine [facility] Office:[phone]

## 2022-10-03 NOTE — Patient Instructions (Addendum)
Please schedule follow up scheduled with myself in 4 months.  If my schedule is not open yet, we will contact you with a reminder closer to that time. Please call [phone] if you haven't heard from [place] a month before.   Use advair 2 puffs once daily with the spacer. We are reducing the dosage due to your blurry vision.  Gargle after use. 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   You can stop the astelin if it is drying out your nose. Let me know if you want to try another medication for allergies.   Continue the prilosec because it is helping your reflux which was causing chest pressure.

## 2022-10-13 ENCOUNTER — Other Ambulatory Visit (HOSPITAL_COMMUNITY): Payer: Self-pay | Admitting: Gastroenterology

## 2022-10-13 DIAGNOSIS — N1832 Chronic kidney disease, stage 3b: Secondary | ICD-10-CM

## 2022-10-13 DIAGNOSIS — R1032 Left lower quadrant pain: Secondary | ICD-10-CM

## 2022-10-17 ENCOUNTER — Ambulatory Visit (HOSPITAL_COMMUNITY)
Admission: RE | Admit: 2022-10-17 | Discharge: 2022-10-17 | Disposition: A | Discharge status: Home / Self Care | Payer: Medicare PPO | Source: Ambulatory Visit | Attending: Gastroenterology | Admitting: Gastroenterology

## 2022-10-17 DIAGNOSIS — R1032 Left lower quadrant pain: Secondary | ICD-10-CM | POA: Diagnosis not present

## 2022-10-17 DIAGNOSIS — N1832 Chronic kidney disease, stage 3b: Secondary | ICD-10-CM | POA: Diagnosis not present

## 2022-10-17 DIAGNOSIS — K5730 Diverticulosis of large intestine without perforation or abscess without bleeding: Secondary | ICD-10-CM | POA: Diagnosis not present

## 2022-10-17 MED ORDER — IOHEXOL 9 MG/ML PO SOLN
ORAL | Status: AC
  Filled 2022-10-17: qty 1000

## 2022-10-17 MED ORDER — IOHEXOL 9 MG/ML PO SOLN
1000.0000 mL | ORAL | Status: AC
  Administered 2022-10-17: 1000 mL via ORAL

## 2022-10-30 DIAGNOSIS — H43812 Vitreous degeneration, left eye: Secondary | ICD-10-CM | POA: Diagnosis not present

## 2022-10-30 DIAGNOSIS — H35362 Drusen (degenerative) of macula, left eye: Secondary | ICD-10-CM | POA: Diagnosis not present

## 2022-10-30 DIAGNOSIS — H401132 Primary open-angle glaucoma, bilateral, moderate stage: Secondary | ICD-10-CM | POA: Diagnosis not present

## 2022-10-30 DIAGNOSIS — H353112 Nonexudative age-related macular degeneration, right eye, intermediate dry stage: Secondary | ICD-10-CM | POA: Diagnosis not present

## 2022-10-30 DIAGNOSIS — Z961 Presence of intraocular lens: Secondary | ICD-10-CM | POA: Diagnosis not present

## 2022-10-30 DIAGNOSIS — H35373 Puckering of macula, bilateral: Secondary | ICD-10-CM | POA: Diagnosis not present

## 2022-12-27 DIAGNOSIS — Z09 Encounter for follow-up examination after completed treatment for conditions other than malignant neoplasm: Secondary | ICD-10-CM | POA: Diagnosis not present

## 2023-02-01 ENCOUNTER — Ambulatory Visit: Payer: Medicare PPO | Admitting: Internal Medicine

## 2023-02-01 ENCOUNTER — Encounter: Payer: Self-pay | Admitting: Internal Medicine

## 2023-02-01 VITALS — BP 135/77 | HR 71 | Temp 97.8°F | Ht 69.0 in | Wt 165.0 lb

## 2023-02-01 DIAGNOSIS — J309 Allergic rhinitis, unspecified: Secondary | ICD-10-CM | POA: Diagnosis not present

## 2023-02-01 DIAGNOSIS — J4540 Moderate persistent asthma, uncomplicated: Secondary | ICD-10-CM | POA: Diagnosis not present

## 2023-02-01 MED ORDER — FLUTICASONE PROPIONATE 50 MCG/ACT NA SUSP: 1.0000 | Freq: Every day | NASAL | 5 refills | Status: AC

## 2023-02-01 MED ORDER — CETIRIZINE HCL 10 MG PO TABS: 10.0000 mg | ORAL_TABLET | Freq: Every day | ORAL | 5 refills | Status: AC

## 2023-02-01 NOTE — Progress Notes (Signed)
AURIELLA Nunez    [id number]    04-21-1940  Primary Care Physician:McNeill, Toniann Fail, MD Date of Appointment: 02/01/2023 Established Patient Visit  Chief complaint:   Chief Complaint  Patient presents with   Follow-up    Some SOB persistent, sinus drainage.  Patient states overall doing well.     HPI: Susan Nunez is a 82 y.o. woman with moderate persistent asthma and GERD with seasonal allergies.   Interval Updates: Here for follow up for asthma. Voice hoarseness improved with MDI and spacer.   She feels her cough is worse related to sinus drainage.   Astelin dried out her eyes.   She has left sided chest discomfort which she describes as a pinching that later turns into burning. Worse with breathing and coughing. Lasts for minutes to an hour or two. Not worse with movement. Does not wake her up from sleep. She has tried tylenol for it which usually helps. She has had these symptoms since she was in her 41s.   Current Regimen: advair, prn albuterol Asthma Triggers: seasonal allergies, changes in temperature, smoke, strong smells Exacerbations in the last year: once requiring prednisone History of hospitalization or intubation: never Allergy Testing: never had.  GERD: prilosec Allergic Rhinitis: yes on claritin (has been on this for a while) ACT:  Asthma Control Test ACT Total Score  10/03/2022  8:58 AM 21   FeNO: 41 ppb --->32 ppb  I have reviewed the patient's family social and past medical history and updated as appropriate.   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 EF at 65% -- mooth and normal coronary arteries -- Normal left ventricular systolic function.  We will continue with medical therapy.  I suspect her chest pain was due to her mitral valve prolapse syndrome --  Vesta Mixer, M.D.    CHOLECYSTECTOMY N/A 01/15/2020   Procedure: LAPAROSCOPIC CHOLECYSTECTOMY WITH INTRAOPERATIVE CHOLANGIOGRAM;  Surgeon: Darnell Level, MD;  Location: [facility];  Service: General;  Laterality: N/A;   FOOT SURGERY  07/12/00   right   JOINT REPLACEMENT     KNEE ARTHROSCOPY  06/14/2009   LUMBAR SPINE SURGERY     lumbar spine fusion L5-S1 performed 20 years ago x 3   TOTAL KNEE ARTHROPLASTY Right 11/04/2014   Procedure: RIGHT TOTAL KNEE ARTHROPLASTY;  Surgeon: Nadara Mustard, MD;  Location: [facility];  Service: Orthopedics;  Laterality: Right;   TOTAL VAGINAL HYSTERECTOMY  12/18/2001    Family History  Problem Relation Age of Onset   Hypertension Mother    Coronary artery disease Father    Heart attack Father    Asthma Lucila [place]  Allergic Disorder Grandson     Social History   Occupational History   Not on file  Tobacco Use   Smoking status: Never   Smokeless tobacco: Never  Vaping Use   Vaping status: Never Used  Substance and Sexual Activity   Alcohol use: No   Drug use: No   Sexual activity: Not on file     Physical Exam: Blood pressure 135/77, pulse 71, temperature 97.8 F (36.6 C), temperature source Oral, height 5\' 9"  (1.753 m), weight 165 lb (74.8 kg), SpO2 100%.  Gen:      No acute distress ENT:  no nasal polyps, mucus membranes moist, mild nasal debris, frequent sniffling Lungs:  ctab no wheeze CV:        RRR no mrg   Data Reviewed: Imaging: I have personally reviewed the  chest xray April 2024 shows hyperinflation  PFTs:      No data to display          Labs: Lab Results  Component Value Date   NA 139 05/08/2022   K 4.6 05/08/2022   CO2 22 05/08/2022   GLUCOSE 108 (H) 05/08/2022   BUN 35 (H) 05/08/2022   CREATININE 1.10 (H) 05/08/2022   CALCIUM 9.9 05/08/2022   GFRNONAA 51 (L) 05/08/2022   Lab Results  Component Value Date   WBC 5.4 05/08/2022   HGB 11.7 (L) 05/08/2022   HCT 36.3 05/08/2022   MCV 100.3 (H) 05/08/2022   PLT 273 05/08/2022      Immunization status: Immunization History  Administered Date(s) Administered   Influenza, High Dose Seasonal PF 12/14/2017, 11/02/2018   PNEUMOCOCCAL CONJUGATE-20 08/24/2022   Zoster Recombinant(Shingrix) 11/16/2018    External Records Personally Reviewed:   Assessment:  Moderate persistent asthma, controlled GERD -  controlled Allergic rhinitis, not well contrlled.   Plan/Recommendations: For asthma - continue advair twice daily with spacer, continue albuterol as needed.  Ok to get covid shot.  Stop taking claritin and switch to zyrtec for your nasal drainage.  Start flonase nasal spray.  Continue prilosec for reflux    Return to Care: Return in about 6 months (around 08/01/2023).   Durel Salts, MD Pulmonary and Critical Care Medicine [facility] Office:[phone]

## 2023-02-01 NOTE — Patient Instructions (Signed)
It was a pleasure to see you today!  Please schedule follow up scheduled with myself in 6 months.  If my schedule is not open yet, we will contact you with a reminder closer to that time. Please call [phone] if you haven't heard from [place] a month before, and always call us sooner if issues or concerns arise. You can also send [place] a message through MyChart, but but aware that this is not to be used for urgent issues and it may take up to 5-7 days to receive a reply. Please be aware that you will likely be able to view your results before I have a chance to respond to them. Please give [place] 5 business days to respond to any non-urgent results.   For asthma - continue advair twice daily with spacer, continue albuterol as needed.  Ok to get covid shot.  Stop taking claritin and switch to zyrtec for your nasal drainage.  Start flonase nasal spray.   Flonase - 1 spray on each side of your nose twice a day for first week, then 1 spray on each side.   Instructions for use: If you also use a saline nasal spray or rinse, use that first. Position the head with the chin slightly tucked. Use the right hand to spray into the left nostril and the right hand to spray into the left nostril.   Point the bottle away from the septum of your nose (cartilage that divides the two sides of your nose).  Hold the nostril closed on the opposite side from where you will spray Spray once and gently sniff to pull the medicine into the higher parts of your nose.  Don't sniff too hard as the medicine will drain down the back of your throat instead. Repeat with a second spray on the same side if prescribed. Repeat on the other side of your nose.

## 2023-02-06 DIAGNOSIS — H52223 Regular astigmatism, bilateral: Secondary | ICD-10-CM | POA: Diagnosis not present

## 2023-02-06 DIAGNOSIS — H40013 Open angle with borderline findings, low risk, bilateral: Secondary | ICD-10-CM | POA: Diagnosis not present

## 2023-02-06 DIAGNOSIS — H5203 Hypermetropia, bilateral: Secondary | ICD-10-CM | POA: Diagnosis not present

## 2023-02-06 DIAGNOSIS — H40053 Ocular hypertension, bilateral: Secondary | ICD-10-CM | POA: Diagnosis not present

## 2023-02-06 DIAGNOSIS — H524 Presbyopia: Secondary | ICD-10-CM | POA: Diagnosis not present

## 2023-02-26 ENCOUNTER — Other Ambulatory Visit (HOSPITAL_COMMUNITY): Payer: Self-pay | Admitting: Family Medicine

## 2023-02-26 DIAGNOSIS — Z6824 Body mass index (BMI) 24.0-24.9, adult: Secondary | ICD-10-CM | POA: Diagnosis not present

## 2023-02-26 DIAGNOSIS — K219 Gastro-esophageal reflux disease without esophagitis: Secondary | ICD-10-CM | POA: Diagnosis not present

## 2023-02-26 DIAGNOSIS — J4540 Moderate persistent asthma, uncomplicated: Secondary | ICD-10-CM | POA: Diagnosis not present

## 2023-02-26 DIAGNOSIS — K5710 Diverticulosis of small intestine without perforation or abscess without bleeding: Secondary | ICD-10-CM | POA: Diagnosis not present

## 2023-02-26 DIAGNOSIS — I10 Essential (primary) hypertension: Secondary | ICD-10-CM | POA: Diagnosis not present

## 2023-02-26 DIAGNOSIS — H40053 Ocular hypertension, bilateral: Secondary | ICD-10-CM | POA: Diagnosis not present

## 2023-02-26 DIAGNOSIS — M810 Age-related osteoporosis without current pathological fracture: Secondary | ICD-10-CM | POA: Diagnosis not present

## 2023-02-26 DIAGNOSIS — E782 Mixed hyperlipidemia: Secondary | ICD-10-CM | POA: Diagnosis not present

## 2023-02-26 DIAGNOSIS — I700 Atherosclerosis of aorta: Secondary | ICD-10-CM | POA: Diagnosis not present

## 2023-04-12 DIAGNOSIS — J069 Acute upper respiratory infection, unspecified: Secondary | ICD-10-CM | POA: Diagnosis not present

## 2023-04-12 DIAGNOSIS — J101 Influenza due to other identified influenza virus with other respiratory manifestations: Secondary | ICD-10-CM | POA: Diagnosis not present

## 2023-04-12 DIAGNOSIS — Z6824 Body mass index (BMI) 24.0-24.9, adult: Secondary | ICD-10-CM | POA: Diagnosis not present

## 2023-04-12 DIAGNOSIS — Z03818 Encounter for observation for suspected exposure to other biological agents ruled out: Secondary | ICD-10-CM | POA: Diagnosis not present

## 2023-04-12 DIAGNOSIS — R051 Acute cough: Secondary | ICD-10-CM | POA: Diagnosis not present

## 2023-05-03 ENCOUNTER — Other Ambulatory Visit (INDEPENDENT_AMBULATORY_CARE_PROVIDER_SITE_OTHER): Payer: Medicare PPO

## 2023-05-03 ENCOUNTER — Ambulatory Visit: Payer: Medicare PPO | Admitting: Orthopedic Surgery

## 2023-05-03 DIAGNOSIS — M5432 Sciatica, left side: Secondary | ICD-10-CM | POA: Diagnosis not present

## 2023-05-03 DIAGNOSIS — M51361 Other intervertebral disc degeneration, lumbar region with lower extremity pain only: Secondary | ICD-10-CM | POA: Diagnosis not present

## 2023-05-04 ENCOUNTER — Telehealth: Payer: Self-pay | Admitting: Orthopedic Surgery

## 2023-05-04 ENCOUNTER — Other Ambulatory Visit: Payer: Self-pay | Admitting: Orthopedic Surgery

## 2023-05-04 ENCOUNTER — Encounter: Payer: Self-pay | Admitting: Orthopedic Surgery

## 2023-05-04 MED ORDER — PREDNISONE 10 MG PO TABS: 10.0000 mg | ORAL_TABLET | Freq: Every day | ORAL | 0 refills | Status: DC

## 2023-05-04 NOTE — Telephone Encounter (Signed)
 I called patient. She states that you gave her prednisone yesterday and she has started it. She has also been taking tylenol but it does nothing for the excruciating pain she is having. She would like to know if there is something you can send in for her for pain to go along with the prednisone?    [facility] [place].  Please advise.

## 2023-05-04 NOTE — Telephone Encounter (Signed)
 Pt request a call regarding the Rx for Prednisone [facility] [place]

## 2023-05-04 NOTE — Telephone Encounter (Signed)
 Patient called requesting a call back regarding pain medication. Amy [phone]

## 2023-05-04 NOTE — Progress Notes (Signed)
 Office Visit Note   Patient: Susan Nunez           Date of Birth: 01/18/1941           MRN: [id number] Visit Date: 05/03/2023              Requested by: Gweneth Dimitri, MD [address] [place],  [place] [address] PCP: Gweneth Dimitri, MD  Chief Complaint  Patient presents with   Left Leg - Pain   Lower Back - Pain      HPI: Patient is an 83 year old woman who presents with left-sided sciatic pain.  She states the pain starts in the left buttocks and radiates to the lateral left ankle.  Patient states this started after doing a lot of coughing from a case of the flu.  Patient states she has difficulty walking.  Assessment & Plan: Visit Diagnoses:  1. Left sciatic nerve pain   2. Degeneration of intervertebral disc of lumbar region with lower extremity pain     Plan: Recommended heat and will call in a prescription for prednisone 10 mg with breakfast.  At follow-up will evaluate for possible need for an MRI scan of her lumbar spine.  Follow-Up Instructions: Return in about 4 weeks (around 05/31/2023).   Ortho Exam  Patient is alert, oriented, no adenopathy, well-dressed, normal affect, normal respiratory effort. Examination patient has an antalgic gait she has no pain with range of motion of the hip knee or ankle she has a negative straight leg raise on the left.  She has symmetric motor strength in both lower extremities with dorsiflexion strength bilaterally 4/5.  She also has symmetric hip flexion strength to 4/5 bilaterally.  Plantarflexion strength 5/5 bilaterally.  Imaging: No results found. No images are attached to the encounter.  Labs: No results found for: "HGBA1C", "ESRSEDRATE", "CRP", "LABURIC", "REPTSTATUS", "GRAMSTAIN", "CULT", "LABORGA"   Lab Results  Component Value Date   ALBUMIN 4.0 10/23/2014   ALBUMIN 3.5 06/26/2010    No results found for: "MG" No results found for: "VD25OH"  No results found for: "PREALBUMIN"    Latest Ref Rng & Units  05/08/2022    8:09 PM 01/07/2020   11:25 AM 05/07/2017    4:33 PM  CBC EXTENDED  WBC 4.0 - 10.5 K/uL 5.4  6.4  10.4   RBC 3.87 - 5.11 MIL/uL 3.62  3.79  3.91   Hemoglobin 12.0 - 15.0 g/dL 19.1  47.8  29.5   HCT 36.0 - 46.0 % 36.3  38.5  38.9   Platelets 150 - 400 K/uL 273  290  273   NEUT# 1.7 - 7.7 K/uL   8.2   Lymph# 0.7 - 4.0 K/uL   1.4      There is no height or weight on file to calculate BMI.  Orders:  Orders Placed This Encounter  Procedures   XR Lumbar Spine 2-3 Views   No orders of the defined types were placed in this encounter.    Procedures: No procedures performed  Clinical Data: No additional findings.  ROS:  All other systems negative, except as noted in the HPI. Review of Systems  Objective: Vital Signs: There were no vitals taken for this visit.  Specialty Comments:  No specialty comments available.  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   Chronic kidney disease    stage 3....renal insufficiency   Complication of anesthesia    difficulty waking up, difficulty peeing, extreme nausea & vomiting   Diverticulosis    GERD (gastroesophageal reflux disease)    not on daily basis   Heart murmur    Hyperlipidemia    Hypertension    Mitral valve prolapse    with mitral regurgitation    PONV (postoperative nausea and vomiting)     Family History  Problem Relation Age of Onset   Hypertension Mother    Coronary artery disease Father    Heart attack Father    Asthma Grandson    Allergic Disorder Grandson     Past Surgical History:  Procedure Laterality Date    ABDOMINAL HYSTERECTOMY     BACK SURGERY     x 3   CARDIAC CATHETERIZATION  02/21/2010   Est. EF at 65% -- mooth and normal coronary arteries -- Normal left ventricular systolic function.  We will continue with medical therapy.  I suspect her chest pain was due to her mitral valve prolapse syndrome --  Vesta Mixer, M.D.    CHOLECYSTECTOMY N/A 01/15/2020   Procedure: LAPAROSCOPIC CHOLECYSTECTOMY WITH INTRAOPERATIVE CHOLANGIOGRAM;  Surgeon: Darnell Level, MD;  Location: [facility];  Service: General;  Laterality: N/A;   FOOT SURGERY  07/12/00   right   JOINT REPLACEMENT     KNEE ARTHROSCOPY  06/14/2009   LUMBAR SPINE SURGERY     lumbar spine fusion L5-S1 performed 20 years ago x 3   TOTAL KNEE ARTHROPLASTY Right 11/04/2014   Procedure: RIGHT TOTAL KNEE ARTHROPLASTY;  Surgeon: Nadara Mustard, MD;  Location: [facility];  Service: Orthopedics;  Laterality: Right;   TOTAL VAGINAL HYSTERECTOMY  12/18/2001   Social History   Occupational History   Not on file  Tobacco Use   Smoking status: Never   Smokeless tobacco: Never  Vaping Use   Vaping status: Never Used  Substance and Sexual Activity   Alcohol use: No   Drug use: No   Sexual activity: Not on file

## 2023-05-07 ENCOUNTER — Other Ambulatory Visit: Payer: Self-pay | Admitting: Orthopedic Surgery

## 2023-05-07 ENCOUNTER — Telehealth: Payer: Self-pay | Admitting: Orthopedic Surgery

## 2023-05-07 DIAGNOSIS — M51361 Other intervertebral disc degeneration, lumbar region with lower extremity pain only: Secondary | ICD-10-CM

## 2023-05-07 DIAGNOSIS — M5432 Sciatica, left side: Secondary | ICD-10-CM

## 2023-05-07 MED ORDER — HYDROCODONE-ACETAMINOPHEN 5-325 MG PO TABS: 1.0000 | ORAL_TABLET | Freq: Four times a day (QID) | ORAL | 0 refills | Status: DC | PRN

## 2023-05-07 NOTE — Telephone Encounter (Signed)
 I called pt and advised that Dr. Lajoyce Corners put order in for MRI she asked that it be scheduled at [facility] if at all possible.

## 2023-05-07 NOTE — Telephone Encounter (Signed)
 Please see message below. At last visit 05/03/2023 had advised may need MRI L spine do you want to order this?

## 2023-05-07 NOTE — Telephone Encounter (Signed)
 Patient called asked if Autumn would call her concerning her medication (Prednisone and pain medication.)  Patient said she can not get out of bed and having trouble getting to the bathroom without assistance. Patient said she is using her walker to get up and down. The number to contact patient is  [phone]

## 2023-05-09 ENCOUNTER — Telehealth: Payer: Self-pay | Admitting: Orthopedic Surgery

## 2023-05-09 NOTE — Telephone Encounter (Signed)
 Patient called. Would like a refill on hydrocodone called in. Her cb# [phone]

## 2023-05-09 NOTE — Telephone Encounter (Signed)
 I called and sw pt and advised that she received a refill on Monday and that it is too soon to refill. She states that she will supplement with OTC pain medication and heat patch. Will await scheduling of the MRI will call with any questions.

## 2023-05-15 ENCOUNTER — Telehealth: Payer: Self-pay | Admitting: Orthopedic Surgery

## 2023-05-15 ENCOUNTER — Other Ambulatory Visit: Payer: Self-pay | Admitting: Orthopedic Surgery

## 2023-05-15 MED ORDER — DIAZEPAM 5 MG PO TABS: 5.0000 mg | ORAL_TABLET | Freq: Four times a day (QID) | ORAL | 0 refills | Status: DC | PRN

## 2023-05-15 NOTE — Telephone Encounter (Signed)
 Pt sch to have MRI asking for something to take prior to procedure please advise.

## 2023-05-15 NOTE — Telephone Encounter (Signed)
 Patient called. Would like something to take before her MRI, [facility] [place]

## 2023-05-15 NOTE — Telephone Encounter (Signed)
 I called and sw pt to advise that rx has been sent to pharm. Reminded pt that she should use heat and per Dr. Lajoyce Corners use only one OTC medication as direction per package instructions. The pt was taking several different things ibuprofen, aleve, naproxen and tylenol. Advised tha this will put too much stress on her liver and kidneys. Voiced understanding and will call with any questions.

## 2023-05-18 ENCOUNTER — Telehealth: Payer: Self-pay | Admitting: Orthopedic Surgery

## 2023-05-18 NOTE — Telephone Encounter (Signed)
 I called pt and she wanted to know what she is supposed to do if she can not tolerate laying down for the MRI. I advised that they will make her as comfortable as they can and that the test does not take hours to complete she should continue with here conservative treatment and await results of scan.

## 2023-05-18 NOTE — Telephone Encounter (Signed)
 Patient would like Autumn to call her.

## 2023-05-19 ENCOUNTER — Ambulatory Visit (HOSPITAL_COMMUNITY)
Admission: RE | Admit: 2023-05-19 | Discharge: 2023-05-19 | Disposition: A | Discharge status: Home / Self Care | Source: Ambulatory Visit | Attending: Orthopedic Surgery | Admitting: Orthopedic Surgery

## 2023-05-19 DIAGNOSIS — M4185 Other forms of scoliosis, thoracolumbar region: Secondary | ICD-10-CM | POA: Diagnosis not present

## 2023-05-19 DIAGNOSIS — M48061 Spinal stenosis, lumbar region without neurogenic claudication: Secondary | ICD-10-CM | POA: Diagnosis not present

## 2023-05-19 DIAGNOSIS — M51361 Other intervertebral disc degeneration, lumbar region with lower extremity pain only: Secondary | ICD-10-CM | POA: Insufficient documentation

## 2023-05-19 DIAGNOSIS — M5432 Sciatica, left side: Secondary | ICD-10-CM | POA: Diagnosis not present

## 2023-05-19 DIAGNOSIS — M5126 Other intervertebral disc displacement, lumbar region: Secondary | ICD-10-CM | POA: Diagnosis not present

## 2023-05-19 DIAGNOSIS — M47816 Spondylosis without myelopathy or radiculopathy, lumbar region: Secondary | ICD-10-CM | POA: Diagnosis not present

## 2023-05-29 ENCOUNTER — Telehealth: Payer: Self-pay | Admitting: Orthopedic Surgery

## 2023-05-29 NOTE — Telephone Encounter (Signed)
 Can you call the read room and see if they can dictate this report. Pt had the test on 05/19/2023 and coming in tomorrow for appt. Pt called to see if she needs to cx appt.

## 2023-05-29 NOTE — Telephone Encounter (Signed)
 Pt is coming in Thursday, will call the reading room and see if we can get it read before visit. Patient doesn't need to cancel.

## 2023-05-29 NOTE — Telephone Encounter (Signed)
 Pt request a call back regarding her mri results are not on Mychart, pt wants to know if she needs to keep her appt 05/31/23.

## 2023-05-30 NOTE — Telephone Encounter (Signed)
 Called reading room. They will put in STAT so this will be read before her apt.

## 2023-05-30 NOTE — Telephone Encounter (Signed)
 Pt informed

## 2023-05-31 ENCOUNTER — Encounter: Payer: Self-pay | Admitting: Orthopedic Surgery

## 2023-05-31 ENCOUNTER — Ambulatory Visit: Payer: Medicare PPO | Admitting: Orthopedic Surgery

## 2023-05-31 DIAGNOSIS — M51361 Other intervertebral disc degeneration, lumbar region with lower extremity pain only: Secondary | ICD-10-CM | POA: Diagnosis not present

## 2023-05-31 DIAGNOSIS — M5432 Sciatica, left side: Secondary | ICD-10-CM | POA: Diagnosis not present

## 2023-05-31 MED ORDER — PREDNISONE 10 MG PO TABS: 20.0000 mg | ORAL_TABLET | Freq: Every day | ORAL | 0 refills | Status: DC

## 2023-05-31 NOTE — Progress Notes (Addendum)
 Office Visit Note   Patient: Susan Nunez           Date of Birth: [birth year]           MRN: [id number] Visit Date: 05/31/2023              Requested by: Gweneth Dimitri, MD [address] 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  Patient states that she has difficulty getting out of bed in the morning secondary to the pain.  Assessment & Plan: Visit Diagnoses:  1. Degeneration of intervertebral disc of lumbar region with lower extremity pain     Plan: Will set her up with physical therapy at [facility] therapy in [place].  A prescription was provided for prednisone to start at 20 mg a day and wean off as she feels comfortable.  We reviewed her osteoporotic medication.  Will make a consult with Dr. Alvester Morin for evaluation for epidural steroid injection.  Follow-Up Instructions: No follow-ups on file.   Ortho Exam  Patient is alert, oriented, no adenopathy, well-dressed, normal affect, normal respiratory effort. Examination patient has no focal motor weakness in either lower extremity.  Review of the MRI scan shows degenerative changes of the lumbar spine throughout the lumbar spine with multilevel foraminal stenosis on the left at L1-2 and on the left at L4-5 which is most likely her symptomatic level.  Patient has disc bulging at L2-3 on the left.  There is also disc bulging at L3-4.   I have reviewed the patient's history and given the presence of a fragility fracture, I have deemed the necessity of a osteoporosis management referral or confirmed that the patient is currently enrolled in a osteoporosis treatment program.   Patient is currently on Actonel.  Imaging: No results  found. No images are attached to the encounter.  Labs: No results found for: "HGBA1C", "ESRSEDRATE", "CRP", "LABURIC", "REPTSTATUS", "GRAMSTAIN", "CULT", "LABORGA"   Lab Results  Component Value Date   ALBUMIN 4.0 10/23/2014   ALBUMIN 3.5 06/26/2010    No results found for: "MG" No results found for: "VD25OH"  No results found for: "PREALBUMIN"    Latest Ref Rng & Units 05/08/2022    8:09 PM 01/07/2020   11:25 AM 05/07/2017    4:33 PM  CBC EXTENDED  WBC 4.0 - 10.5 K/uL 5.4  6.4  10.4   RBC 3.87 - 5.11 MIL/uL 3.62  3.79  3.91   Hemoglobin 12.0 - 15.0 g/dL 47.4  25.9  56.3   HCT 36.0 - 46.0 % 36.3  38.5  38.9   Platelets 150 - 400 K/uL 273  290  273   NEUT# 1.7 - 7.7 K/uL   8.2   Lymph# 0.7 - 4.0 K/uL   1.4      There is no height or weight on file to calculate BMI.  Orders:  Orders Placed This Encounter  Procedures   Ambulatory referral to Physical Medicine Rehab   Ambulatory referral to Physical Therapy   Meds ordered this encounter  Medications   predniSONE (DELTASONE) 10 MG tablet    Sig: Take 2 tablets (20 mg total) by mouth daily with breakfast.  Dispense:  60 tablet    Refill:  0     Procedures: No procedures performed  Clinical Data: No additional findings.  ROS:  All other systems negative, except as noted in the HPI. Review of Systems  Objective: Vital Signs: There were no vitals taken for this visit.  Specialty Comments:  No specialty comments available.  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   Chronic kidney disease    stage 3....renal insufficiency    Complication of anesthesia    difficulty waking up, difficulty peeing, extreme nausea & vomiting   Diverticulosis    GERD (gastroesophageal reflux disease)    not on daily basis   Heart murmur    Hyperlipidemia    Hypertension    Mitral valve prolapse    with mitral regurgitation    PONV (postoperative nausea and vomiting)     Family History  Problem Relation Age of Onset   Hypertension Mother    Coronary artery disease Father    Heart attack Father    Asthma Grandson    Allergic Disorder Grandson     Past Surgical History:  Procedure Laterality Date   ABDOMINAL HYSTERECTOMY     BACK SURGERY     x 3   CARDIAC CATHETERIZATION  02/21/2010   Est. EF at 65% -- mooth and normal coronary arteries -- Normal left ventricular systolic function.  We will continue with medical therapy.  I suspect her chest pain was due to her mitral valve prolapse syndrome --  Vesta Mixer, M.D.    CHOLECYSTECTOMY N/A 01/15/2020   Procedure: LAPAROSCOPIC CHOLECYSTECTOMY WITH INTRAOPERATIVE CHOLANGIOGRAM;  Surgeon: Darnell Level, MD;  Location: [facility];  Service: General;  Laterality: N/A;   FOOT SURGERY  07/12/00   right   JOINT REPLACEMENT     KNEE ARTHROSCOPY  06/14/2009   LUMBAR SPINE SURGERY     lumbar spine fusion L5-S1 performed 20 years ago x 3   TOTAL KNEE ARTHROPLASTY Right 11/04/2014   Procedure: RIGHT TOTAL KNEE ARTHROPLASTY;  Surgeon: Nadara Mustard, MD;  Location: [facility];  Service: Orthopedics;  Laterality: Right;   TOTAL VAGINAL HYSTERECTOMY  12/18/2001   Social History   Occupational History   Not on file  Tobacco Use   Smoking status: Never   Smokeless tobacco: Never  Vaping Use   Vaping status: Never Used  Substance and Sexual Activity   Alcohol use: No   Drug use: No   Sexual activity: Not on file

## 2023-06-14 ENCOUNTER — Telehealth: Payer: Self-pay | Admitting: Physical Medicine and Rehabilitation

## 2023-06-14 NOTE — Telephone Encounter (Signed)
 Pt is requesting a phone call in regards to her upcoming appt and medication she's currently taking. Best call back [phone]

## 2023-06-18 ENCOUNTER — Other Ambulatory Visit: Payer: Self-pay | Admitting: Physical Medicine and Rehabilitation

## 2023-06-18 ENCOUNTER — Telehealth: Payer: Self-pay | Admitting: Physical Medicine and Rehabilitation

## 2023-06-18 MED ORDER — DIAZEPAM 5 MG PO TABS: ORAL_TABLET | ORAL | 0 refills | Status: DC

## 2023-06-18 NOTE — Telephone Encounter (Signed)
 Patient called. Would like some medication called in before her appointment 4/10

## 2023-06-21 ENCOUNTER — Encounter: Payer: Self-pay | Admitting: Physical Medicine and Rehabilitation

## 2023-06-21 ENCOUNTER — Ambulatory Visit: Admitting: Physical Medicine and Rehabilitation

## 2023-06-21 ENCOUNTER — Other Ambulatory Visit: Payer: Self-pay

## 2023-06-21 VITALS — BP 117/76 | HR 79

## 2023-06-21 DIAGNOSIS — M5116 Intervertebral disc disorders with radiculopathy, lumbar region: Secondary | ICD-10-CM

## 2023-06-21 DIAGNOSIS — M961 Postlaminectomy syndrome, not elsewhere classified: Secondary | ICD-10-CM

## 2023-06-21 DIAGNOSIS — F411 Generalized anxiety disorder: Secondary | ICD-10-CM

## 2023-06-21 DIAGNOSIS — M5416 Radiculopathy, lumbar region: Secondary | ICD-10-CM

## 2023-06-21 MED ORDER — TRIAZOLAM 0.25 MG PO TABS: ORAL_TABLET | ORAL | 0 refills | Status: DC

## 2023-06-21 MED ORDER — METHYLPREDNISOLONE ACETATE 40 MG/ML IJ SUSP: 40.0000 mg | Freq: Once | INTRAMUSCULAR | Status: DC

## 2023-06-21 NOTE — Patient Instructions (Signed)

## 2023-06-21 NOTE — Progress Notes (Signed)
 Susan Nunez - 83 y.o. female MRN [id number]  Date of birth: 08-22-1940  Office Visit Note: Visit Date: 06/21/2023 PCP: Gweneth Dimitri, MD Referred by: Gweneth Dimitri, MD  Subjective: Chief Complaint  Patient presents with   Lower Back - Pain   HPI: Susan Nunez is a 83 y.o. female who comes in today for evaluation and management of chronic several weeks now approximately 9 weeks of left radicular leg pain quite severe.  She has been followed by [place] [facility] for many years.  Very pleasant lady somewhat anxious but accompanied by her daughter who provides a lot of history as well.  She has been followed recently by Dr. Aldean Baker.  She reports sometime in January just doing things around the house putting up decorations etc. she began having this left radicular leg pain and its really been unremittent since that time although she feels like her left foot is getting somewhat better in terms of the numbness feel that she was having.  She has had MRI showing multilevel degenerative disease with probable cause of her symptoms a L4-5 left paracentral disc herniation but no real compression of the nerve roots.  The MRI is reviewed below in detail and the report.  Her case is complicated by history of prior lumbar fusion by Dr. Kathrene Bongo which appears to be noninstrumented at the time that many years ago.  She had this approximately 40 years ago.  The disc base at L5-S1 is clearly fused.  She has had some back problems off and on since that time.  She reports that really over the last couple years she has had a lot of issues with a small bowel resection to treat chronic intermittent abdominal pain and what appears to have been some mesentery and bowel twisting.  This does make visualization on x-ray pretty problematic as she has a lot of bowel loops in moderate stool burden on x-ray visualization.  In terms of her symptoms she is having a pretty classic left S1 distribution symptoms down the left  leg into the calf and into the lateral 2 toes and bottom of the foot.  She does not report much in the way of back pain per se.  It is very problematic though and very painful at this point despite a couple rounds of prednisone.  She has not had physical therapy recently just because of the pain level.  She is using a walker and a cane at this point because of the pain and ordinarily pretty active.  In anticipation of potential injection today her daughter has been giving her small amount of Xanax because she is very anxious about having the injection done.     Review of Systems  Musculoskeletal:  Positive for joint pain.  Neurological:  Positive for tingling.  Psychiatric/Behavioral:  The patient is nervous/anxious.   All other systems reviewed and are negative.  Otherwise per HPI.  Assessment & Plan: Visit Diagnoses:    ICD-10-CM   1. Lumbar radiculopathy  M54.16 XR C-ARM NO REPORT    Epidural Steroid injection    methylPREDNISolone acetate (DEPO-MEDROL) injection 40 mg    2. 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 Post laminectomy syndrome  M96.1     4. Pre-operative anxiety  F41.1        Plan: Findings:  Approximately 2 to 3 months of worsening severe left radicular leg pain in a pretty classic S1 distribution in the setting of  prior lumbar fusion remotely at L5-S1 noninstrumented and disc herniation at L4-5 on the left.  Complicating factors are anxiety as well as prior abdominal surgery and significant imaging of stool burden and bowel loops on x-ray and fluoroscopy.  Please see the fluoroscopy note today.  We were unable to complete an S1 transforaminal injection which we were trying to tent based on the symptoms and the fact that she had the prior fusion..  This was really just problematic because of his realization and the patient's anxiety.  At this point I think the best approach is an L4-5 interlaminar epidural steroid injection above the fusion  I think the medication will still get to the nerve roots involved and this should be a little bit easier to see and we will do this with preprocedure triazolam which I think will give her a little bit more sedating effect.  This was gone over today with her daughter and we will get her back in pretty quick to do that injection.    Meds & Orders:  Meds ordered this encounter  Medications   methylPREDNISolone acetate (DEPO-MEDROL) injection 40 mg   triazolam (HALCION) 0.25 MG tablet    Sig: Take one tablet (0.25 mg) by mouth with food 30 minutes prior to procedure.    Dispense:  1 tablet    Refill:  0    Orders Placed This Encounter  Procedures   XR C-ARM NO REPORT   Epidural Steroid injection    Follow-up: No follow-ups on file.   Procedures: No procedures performed  S1 Lumbosacral Transforaminal Epidural Steroid Injection - Sub-Pedicular Approach with Fluoroscopic Guidance   Patient: Susan Nunez      Date of Birth: 07/26/1940 MRN: [id number] PCP: Gweneth Dimitri, MD      Visit Date: 06/21/2023   Universal Protocol:    Date/Time: 06/21/2510:22 PM  Consent Given By: the patient  Position:  PRONE  Additional Comments: Vital signs were monitored before and after the procedure. Patient was prepped and draped in the usual sterile fashion. The correct patient, procedure, and site was verified.   Injection Procedure Details:  Procedure Site One Meds Administered:  No medications administered today except for lidocaine.  Laterality: Left  Location/Site:  S1 Foramen   Needle size: 22 ga.  Needle type: Spinal  Needle Placement: Transforaminal  Findings:   -Comments:    Procedure Details: After squaring off the sacral end-plate to get a true AP view, the C-arm was positioned so that the best possible view of the S1 foramen was visualized. The soft tissues overlying this structure were infiltrated with 2-3 ml. of 1% Lidocaine without Epinephrine.  Even with just  27-gauge needle and cold spray patient has significant amount of pain response to numbing the skin.   The spinal needle was inserted toward the target using a "trajectory" view along the fluoroscope beam.  Under AP and lateral visualization the needle was advanced without difficulty although she did have some exaggerated response to small movements in the muscle.  Unfortunately did at the prior abdominal surgery and scattered bowel picture on the fluoroscopic imaging is very difficult to visualize the foramen and the sacrum.  Despite multiple attempts and different angles we are just unable to get the needle into the foramen.  At that point we readjusted to try to attempt an L5 transforaminal approach.  It appeared that the fusion prior did not really include a lateral mass fusion and the attempt there was fraught with problems because every  time there was needle movement anywhere in the musculature she again had this very exaggerated response.  I elected at that point to stop the procedure to see if we could get a more simple injection done with more oral sedation.  Please see the note today for further details.   Additional Comments:   Dressing: Band-Aid with 2 x 2 sterile gauze    Post-procedure details: Patient was observed during the procedure. Post-procedure instructions were reviewed.  Patient left the clinic in stable condition.   Clinical History: MRI LUMBAR SPINE WITHOUT CONTRAST   TECHNIQUE: Multiplanar, multisequence MR imaging of the lumbar spine was performed. No intravenous contrast was administered.   COMPARISON:  Lumbar spine radiograph 05/03/2023   FINDINGS: Segmentation:  Standard.   Alignment: Straightening of the normal lumbar lordosis. Trace retrolisthesis of L3 on L4. S shaped scoliosis of the thoracolumbar spine. There is leftward convex curvature centered at T12-L1 and rightward convex curvature centered at L3-4.   Vertebrae: Modic type 1 degenerative  endplate changes with associated discogenic edema at T11-12 more pronounced on the right. Additional degenerative endplate changes and discogenic edema at L3-4. No evidence of fracture. Heterogeneous bone marrow signal intensity with multiple areas of fatty marrow versus hemangiomas. No suspicious osseous lesion appreciated.   Conus medullaris and cauda equina: Conus extends to the mid/lower L2 level. Conus and cauda equina appear normal.   Paraspinal and other soft tissues: Mild atrophy of the paraspinal musculature. Multiple parapelvic cysts in the left kidney are similar to prior. Mild prominence of the left renal pelvis. Scattered diverticuli along the partially visualized descending colon.   Disc levels:   T12-L1: Diffuse disc bulge and superimposed left subarticular disc protrusion which indents the ventral thecal sac. Bilateral facet arthrosis. Mild spinal canal stenosis. No significant foraminal stenosis.   L1-2: Diffuse disc bulge slightly eccentric to the right which indents the ventral thecal sac without significant spinal canal stenosis. Moderate bilateral facet arthrosis. Mild-to-moderate foraminal stenosis on the right secondary to prominent facet osteophytes.   L2-3: Mild disc height loss. Diffuse disc bulge and posterior osteophytic ridging which indents the ventral thecal sac resulting in narrowing of the lateral recesses. Disc bulge likely abuts the traversing left L3 nerve roots. Moderate facet arthrosis. Mild bilateral foraminal stenosis.   L3-4: Severe disc height loss. Disc bulge and posterior osteophytic ridging which indents the ventral thecal sac resulting in narrowing of the lateral recesses. Disc bulge and osteophytes possibly contacts the traversing bilateral L4 nerve roots. Moderate facet arthrosis. Mild spinal canal stenosis. Mild bilateral foraminal stenosis.   L4-5: Diffuse disc bulge and prominent central disc protrusion which indents the  ventral thecal sac. Moderate facet arthrosis. Mild spinal canal stenosis. There is mild narrowing of the left lateral recess without mass effect on the traversing nerve roots. Mild-to-moderate left foraminal stenosis.   L5-S1: Severe disc space narrowing with partial fusion of the vertebral bodies. No significant spinal canal or foraminal stenosis.   IMPRESSION: Degenerative changes of the lumbar spine as above. Disc bulge at L2-3 likely abuts the traversing left L3 nerve roots. Additional disc bulge and osteophytes at L3-4 likely abuts the traversing bilateral L4 nerve roots.   Multilevel foraminal stenosis, greatest and mild-to-moderate on the right at L1-2 and on the left at L4-5.   Degenerative endplate changes with associated discogenic edema at T11-12 and L3-4 which may contribute to back pain.   S shaped scoliosis of the thoracolumbar spine as above.     Electronically Signed  By: Emily Filbert M.D.   On: 05/30/2023 13:09   She reports that she has never smoked. She has never used smokeless tobacco. No results for input(s): "HGBA1C", "LABURIC" in the last 8760 hours.  Objective:  VS:  HT:    WT:   BMI:     BP:117/76  HR:79bpm  TEMP: ( )  RESP:  Physical Exam Vitals and nursing note reviewed.  Constitutional:      General: She is not in acute distress.    Appearance: Normal appearance. She is not ill-appearing.  HENT:     Head: Normocephalic and atraumatic.     Right Ear: External ear normal.     Left Ear: External ear normal.  Eyes:     Extraocular Movements: Extraocular movements intact.  Cardiovascular:     Rate and Rhythm: Normal rate.     Pulses: Normal pulses.  Pulmonary:     Effort: Pulmonary effort is normal. No respiratory distress.  Abdominal:     General: There is no distension.     Palpations: Abdomen is soft.  Musculoskeletal:        General: Tenderness present.     Cervical back: Neck supple.     Right lower leg: No edema.     Left lower  leg: No edema.     Comments: Patient has good distal strength with no pain over the greater trochanters.  No clonus or focal weakness.  Skin:    Findings: No erythema, lesion or rash.  Neurological:     General: No focal deficit present.     Mental Status: She is alert and oriented to person, place, and time.     Cranial Nerves: No cranial nerve deficit.     Sensory: No sensory deficit.     Motor: No weakness or abnormal muscle tone.     Coordination: Coordination normal.     Gait: Gait abnormal.  Psychiatric:        Mood and Affect: Mood normal.        Behavior: Behavior normal.     Ortho Exam  Imaging: No results found.  Past Medical/Family/Surgical/Social History: Medications & Allergies reviewed per EMR, new medications updated. 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   Chronic kidney disease    stage 3....renal insufficiency   Complication of anesthesia    difficulty waking up, difficulty peeing, extreme nausea & vomiting   Diverticulosis    GERD (gastroesophageal reflux disease)    not on daily basis   Heart murmur    Hyperlipidemia    Hypertension    Mitral valve prolapse    with mitral regurgitation    PONV (postoperative nausea and vomiting)    Family History  Problem Relation Age of Onset   Hypertension Mother    Coronary artery disease Father    Heart attack Father    Asthma Grandson    Allergic Disorder Grandson    Past Surgical History:  Procedure Laterality Date   ABDOMINAL HYSTERECTOMY     BACK SURGERY     x 3   CARDIAC CATHETERIZATION  02/21/2010   Est. EF at 65% -- mooth and  normal coronary arteries -- Normal left ventricular systolic function.  We will continue with medical therapy.  I suspect her chest pain was due to her mitral valve prolapse syndrome --  Vesta Mixer, M.D.    CHOLECYSTECTOMY N/A 01/15/2020   Procedure: LAPAROSCOPIC CHOLECYSTECTOMY WITH INTRAOPERATIVE CHOLANGIOGRAM;  Surgeon: Darnell Level, MD;  Location: [facility];  Service: General;  Laterality: N/A;   FOOT SURGERY  07/12/00   right   JOINT REPLACEMENT     KNEE ARTHROSCOPY  06/14/2009   LUMBAR SPINE SURGERY     lumbar spine fusion L5-S1 performed 20 years ago x 3   TOTAL KNEE ARTHROPLASTY Right 11/04/2014   Procedure: RIGHT TOTAL KNEE ARTHROPLASTY;  Surgeon: Nadara Mustard, MD;  Location: [facility];  Service: Orthopedics;  Laterality: Right;   TOTAL VAGINAL HYSTERECTOMY  12/18/2001   Social History   Occupational History   Not on file  Tobacco Use   Smoking status: Never   Smokeless tobacco: Never  Vaping Use   Vaping status: Never Used  Substance and Sexual Activity   Alcohol use: No   Drug use: No   Sexual activity: Not on file

## 2023-06-21 NOTE — Progress Notes (Signed)
 Pain Scale   Average Pain 3 Patient advising she had pain when walking and standing, she states she had stiffness trying to get out of bed and she usually uses a heating pad before bed to relax her back. She states pain radiates down her left leg.        +Driver, -BT, -Dye Allergies.

## 2023-06-21 NOTE — Procedures (Signed)
 S1 Lumbosacral Transforaminal Epidural Steroid Injection - Sub-Pedicular Approach with Fluoroscopic Guidance   Patient: Susan Nunez      Date of Birth: 10-20-1940 MRN: [id number] PCP: Gweneth Dimitri, MD      Visit Date: 06/21/2023   Universal Protocol:    Date/Time: 06/21/2510:22 PM  Consent Given By: the patient  Position:  PRONE  Additional Comments: Vital signs were monitored before and after the procedure. Patient was prepped and draped in the usual sterile fashion. The correct patient, procedure, and site was verified.   Injection Procedure Details:  Procedure Site One Meds Administered:  No medications administered today except for lidocaine.  Laterality: Left  Location/Site:  S1 Foramen   Needle size: 22 ga.  Needle type: Spinal  Needle Placement: Transforaminal  Findings:   -Comments:    Procedure Details: After squaring off the sacral end-plate to get a true AP view, the C-arm was positioned so that the best possible view of the S1 foramen was visualized. The soft tissues overlying this structure were infiltrated with 2-3 ml. of 1% Lidocaine without Epinephrine.  Even with just 27-gauge needle and cold spray patient has significant amount of pain response to numbing the skin.   The spinal needle was inserted toward the target using a "trajectory" view along the fluoroscope beam.  Under AP and lateral visualization the needle was advanced without difficulty although she did have some exaggerated response to small movements in the muscle.  Unfortunately did at the prior abdominal surgery and scattered bowel picture on the fluoroscopic imaging is very difficult to visualize the foramen and the sacrum.  Despite multiple attempts and different angles we are just unable to get the needle into the foramen.  At that point we readjusted to try to attempt an L5 transforaminal approach.  It appeared that the fusion prior did not really include a lateral mass fusion and  the attempt there was fraught with problems because every time there was needle movement anywhere in the musculature she again had this very exaggerated response.  I elected at that point to stop the procedure to see if we could get a more simple injection done with more oral sedation.  Please see the note today for further details.   Additional Comments:   Dressing: Band-Aid with 2 x 2 sterile gauze    Post-procedure details: Patient was observed during the procedure. Post-procedure instructions were reviewed.  Patient left the clinic in stable condition.

## 2023-06-27 ENCOUNTER — Ambulatory Visit (INDEPENDENT_AMBULATORY_CARE_PROVIDER_SITE_OTHER): Admitting: Physical Medicine and Rehabilitation

## 2023-06-27 ENCOUNTER — Other Ambulatory Visit: Payer: Self-pay

## 2023-06-27 VITALS — BP 107/71 | HR 89

## 2023-06-27 DIAGNOSIS — M5416 Radiculopathy, lumbar region: Secondary | ICD-10-CM

## 2023-06-27 MED ORDER — METHYLPREDNISOLONE ACETATE 40 MG/ML IJ SUSP
40.0000 mg | Freq: Once | INTRAMUSCULAR | Status: AC
  Administered 2023-06-27: 40 mg

## 2023-06-27 NOTE — Procedures (Signed)
 Lumbar Epidural Steroid Injection - Interlaminar Approach with Fluoroscopic Guidance  Patient: Susan Nunez      Date of Birth: 01-Mar-1941 MRN: [id number] PCP: Helyn Lobstein, MD      Visit Date: 06/27/2023   Universal Protocol:     Consent Given By: the patient  Position: PRONE  Additional Comments: Vital signs were monitored before and after the procedure. Patient was prepped and draped in the usual sterile fashion. The correct patient, procedure, and site was verified.   Injection Procedure Details:   Procedure diagnoses: Lumbar radiculopathy [M54.16]   Meds Administered:  Meds ordered this encounter  Medications   methylPREDNISolone acetate (DEPO-MEDROL) injection 40 mg     Laterality: Left  Location/Site:  L4-5  Needle: 3.5 in., 20 ga. Tuohy  Needle Placement: Paramedian epidural  Findings:   -Comments: Excellent flow of contrast into the epidural space.  As of note, patient did much better with triazolam oral sedation prior to the procedure.  Also she had much reduced bowel contents and this made visualization much simpler.  Procedure Details: Using a paramedian approach from the side mentioned above, the region overlying the inferior lamina was localized under fluoroscopic visualization and the soft tissues overlying this structure were infiltrated with 4 ml. of 1% Lidocaine without Epinephrine. The Tuohy needle was inserted into the epidural space using a paramedian approach.   The epidural space was localized using loss of resistance along with counter oblique bi-planar fluoroscopic views.  After negative aspirate for air, blood, and CSF, a 2 ml. volume of Isovue-250 was injected into the epidural space and the flow of contrast was observed. Radiographs were obtained for documentation purposes.    The injectate was administered into the level noted above.   Additional Comments:  No complications occurred Dressing: 2 x 2 sterile gauze and Band-Aid     Post-procedure details: Patient was observed during the procedure. Post-procedure instructions were reviewed.  Patient left the clinic in stable condition.

## 2023-06-27 NOTE — Progress Notes (Signed)
 Pain Scale   Average Pain 2 Patient advising her lower back pain is constant radiates to her right leg.        +Driver, -BT, -Dye Allergies.

## 2023-06-27 NOTE — Patient Instructions (Signed)

## 2023-06-27 NOTE — Progress Notes (Signed)
 Susan Nunez - 83 y.o. female MRN [id number]  Date of birth: [birth year]  Office Visit Note: Visit Date: 06/27/2023 PCP: Gweneth Dimitri, MD Referred by: Gweneth Dimitri, MD  Subjective: Chief Complaint  Patient presents with   Lower Back - Pain   HPI:  Susan Nunez is a 83 y.o. female who comes in today at the request of Dr. Aldean Baker for planned Left L4-5 Lumbar Interlaminar epidural steroid injection with fluoroscopic guidance.  The patient has failed conservative care including home exercise, medications, time and activity modification.  This injection will be diagnostic and hopefully therapeutic.  Please see requesting physician notes for further details and justification.   ROS Otherwise per HPI.  Assessment & Plan: Visit Diagnoses:    ICD-10-CM   1. Lumbar radiculopathy  M54.16 XR C-ARM NO REPORT    Epidural Steroid injection    methylPREDNISolone acetate (DEPO-MEDROL) injection 40 mg      Plan: No additional findings.   Meds & Orders:  Meds ordered this encounter  Medications   methylPREDNISolone acetate (DEPO-MEDROL) injection 40 mg    Orders Placed This Encounter  Procedures   XR C-ARM NO REPORT   Epidural Steroid injection    Follow-up: Return if symptoms worsen or fail to improve.   Procedures: No procedures performed  Lumbar Epidural Steroid Injection - Interlaminar Approach with Fluoroscopic Guidance  Patient: Susan Nunez      Date of Birth: [birth year] MRN: [id number] PCP: Gweneth Dimitri, MD      Visit Date: 06/27/2023   Universal Protocol:     Consent Given By: the patient  Position: PRONE  Additional Comments: Vital signs were monitored before and after the procedure. Patient was prepped and draped in the usual sterile fashion. The correct patient, procedure, and site was verified.   Injection Procedure Details:   Procedure diagnoses: Lumbar radiculopathy [M54.16]   Meds Administered:  Meds ordered this encounter  Medications    methylPREDNISolone acetate (DEPO-MEDROL) injection 40 mg     Laterality: Left  Location/Site:  L4-5  Needle: 3.5 in., 20 ga. Tuohy  Needle Placement: Paramedian epidural  Findings:   -Comments: Excellent flow of contrast into the epidural space.  As of note, patient did much better with triazolam oral sedation prior to the procedure.  Also she had much reduced bowel contents and this made visualization much simpler.  Procedure Details: Using a paramedian approach from the side mentioned above, the region overlying the inferior lamina was localized under fluoroscopic visualization and the soft tissues overlying this structure were infiltrated with 4 ml. of 1% Lidocaine without Epinephrine. The Tuohy needle was inserted into the epidural space using a paramedian approach.   The epidural space was localized using loss of resistance along with counter oblique bi-planar fluoroscopic views.  After negative aspirate for air, blood, and CSF, a 2 ml. volume of Isovue-250 was injected into the epidural space and the flow of contrast was observed. Radiographs were obtained for documentation purposes.    The injectate was administered into the level noted above.   Additional Comments:  No complications occurred Dressing: 2 x 2 sterile gauze and Band-Aid    Post-procedure details: Patient was observed during the procedure. Post-procedure instructions were reviewed.  Patient left the clinic in stable condition.   Clinical History: MRI LUMBAR SPINE WITHOUT CONTRAST   TECHNIQUE: Multiplanar, multisequence MR imaging of the lumbar spine was performed. No intravenous contrast was administered.   COMPARISON:  Lumbar spine radiograph 05/03/2023   FINDINGS:  Segmentation:  Standard.   Alignment: Straightening of the normal lumbar lordosis. Trace retrolisthesis of L3 on L4. S shaped scoliosis of the thoracolumbar spine. There is leftward convex curvature centered at T12-L1 and rightward  convex curvature centered at L3-4.   Vertebrae: Modic type 1 degenerative endplate changes with associated discogenic edema at T11-12 more pronounced on the right. Additional degenerative endplate changes and discogenic edema at L3-4. No evidence of fracture. Heterogeneous bone marrow signal intensity with multiple areas of fatty marrow versus hemangiomas. No suspicious osseous lesion appreciated.   Conus medullaris and cauda equina: Conus extends to the mid/lower L2 level. Conus and cauda equina appear normal.   Paraspinal and other soft tissues: Mild atrophy of the paraspinal musculature. Multiple parapelvic cysts in the left kidney are similar to prior. Mild prominence of the left renal pelvis. Scattered diverticuli along the partially visualized descending colon.   Disc levels:   T12-L1: Diffuse disc bulge and superimposed left subarticular disc protrusion which indents the ventral thecal sac. Bilateral facet arthrosis. Mild spinal canal stenosis. No significant foraminal stenosis.   L1-2: Diffuse disc bulge slightly eccentric to the right which indents the ventral thecal sac without significant spinal canal stenosis. Moderate bilateral facet arthrosis. Mild-to-moderate foraminal stenosis on the right secondary to prominent facet osteophytes.   L2-3: Mild disc height loss. Diffuse disc bulge and posterior osteophytic ridging which indents the ventral thecal sac resulting in narrowing of the lateral recesses. Disc bulge likely abuts the traversing left L3 nerve roots. Moderate facet arthrosis. Mild bilateral foraminal stenosis.   L3-4: Severe disc height loss. Disc bulge and posterior osteophytic ridging which indents the ventral thecal sac resulting in narrowing of the lateral recesses. Disc bulge and osteophytes possibly contacts the traversing bilateral L4 nerve roots. Moderate facet arthrosis. Mild spinal canal stenosis. Mild bilateral foraminal stenosis.   L4-5:  Diffuse disc bulge and prominent central disc protrusion which indents the ventral thecal sac. Moderate facet arthrosis. Mild spinal canal stenosis. There is mild narrowing of the left lateral recess without mass effect on the traversing nerve roots. Mild-to-moderate left foraminal stenosis.   L5-S1: Severe disc space narrowing with partial fusion of the vertebral bodies. No significant spinal canal or foraminal stenosis.   IMPRESSION: Degenerative changes of the lumbar spine as above. Disc bulge at L2-3 likely abuts the traversing left L3 nerve roots. Additional disc bulge and osteophytes at L3-4 likely abuts the traversing bilateral L4 nerve roots.   Multilevel foraminal stenosis, greatest and mild-to-moderate on the right at L1-2 and on the left at L4-5.   Degenerative endplate changes with associated discogenic edema at T11-12 and L3-4 which may contribute to back pain.   S shaped scoliosis of the thoracolumbar spine as above.     Electronically Signed   By: Emily Filbert M.D.   On: 05/30/2023 13:09     Objective:  VS:  HT:    WT:   BMI:     BP:107/71  HR:89bpm  TEMP: ( )  RESP:  Physical Exam Vitals and nursing note reviewed.  Constitutional:      General: She is not in acute distress.    Appearance: Normal appearance. She is not ill-appearing.  HENT:     Head: Normocephalic and atraumatic.     Right Ear: External ear normal.     Left Ear: External ear normal.  Eyes:     Extraocular Movements: Extraocular movements intact.  Cardiovascular:     Rate and Rhythm: Normal rate.  Pulses: Normal pulses.  Pulmonary:     Effort: Pulmonary effort is normal. No respiratory distress.  Abdominal:     General: There is no distension.     Palpations: Abdomen is soft.  Musculoskeletal:        General: Tenderness present.     Cervical back: Neck supple.     Right lower leg: No edema.     Left lower leg: No edema.     Comments: Patient has good distal strength with  no pain over the greater trochanters.  No clonus or focal weakness.  Skin:    Findings: No erythema, lesion or rash.  Neurological:     General: No focal deficit present.     Mental Status: She is alert and oriented to person, place, and time.     Sensory: No sensory deficit.     Motor: No weakness or abnormal muscle tone.     Coordination: Coordination normal.  Psychiatric:        Mood and Affect: Mood normal.        Behavior: Behavior normal.     Comments: Oral sedation today      Imaging: XR C-ARM NO REPORT Result Date: 06/27/2023 Please see Notes tab for imaging impression.

## 2023-07-11 ENCOUNTER — Ambulatory Visit (HOSPITAL_COMMUNITY)
Admission: RE | Admit: 2023-07-11 | Discharge: 2023-07-11 | Disposition: A | Discharge status: Home / Self Care | Payer: Medicare PPO | Source: Ambulatory Visit | Attending: Family Medicine | Admitting: Family Medicine

## 2023-07-11 DIAGNOSIS — Z780 Asymptomatic menopausal state: Secondary | ICD-10-CM | POA: Diagnosis not present

## 2023-07-11 DIAGNOSIS — M810 Age-related osteoporosis without current pathological fracture: Secondary | ICD-10-CM | POA: Diagnosis not present

## 2023-07-12 ENCOUNTER — Ambulatory Visit: Attending: Orthopedic Surgery

## 2023-07-12 DIAGNOSIS — M51361 Other intervertebral disc degeneration, lumbar region with lower extremity pain only: Secondary | ICD-10-CM | POA: Insufficient documentation

## 2023-07-12 DIAGNOSIS — M6281 Muscle weakness (generalized): Secondary | ICD-10-CM | POA: Diagnosis not present

## 2023-07-12 DIAGNOSIS — M5417 Radiculopathy, lumbosacral region: Secondary | ICD-10-CM | POA: Diagnosis not present

## 2023-07-12 NOTE — Addendum Note (Signed)
 Addended by: Lane Pinon on: 07/12/2023 02:46 PM   Modules accepted: Orders

## 2023-07-12 NOTE — Therapy (Addendum)
 OUTPATIENT PHYSICAL THERAPY THORACOLUMBAR EVALUATION   Patient Name: Susan Nunez MRN: [id number] DOB:18-Dec-1940, 83 y.o., female Today's Date: 07/12/2023  END OF SESSION:  PT End of Session - 07/12/23 1200     Visit Number 1    Number of Visits 12    Date for PT Re-Evaluation 08/24/23    PT Start Time 1100    PT Stop Time 1145    PT Time Calculation (min) 45 min    Activity Tolerance Patient tolerated treatment well    Behavior During Therapy WFL for tasks assessed/performed             Past Medical History:  Diagnosis Date   Anxiety    when husband was ill and died   Arthritis    Cancer (HCC)    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 EF at 65% -- mooth and normal coronary arteries -- Normal left ventricular systolic function.  We will continue with medical therapy.  I suspect her chest pain was due to her mitral valve prolapse syndrome --  [place], M.D.    CHOLECYSTECTOMY N/A 01/15/2020   Procedure: LAPAROSCOPIC CHOLECYSTECTOMY WITH INTRAOPERATIVE CHOLANGIOGRAM;  Surgeon: Oralee Billow, MD;  Location: [facility];  Service: General;  Laterality: N/A;   FOOT SURGERY  07/12/00   right   JOINT REPLACEMENT     KNEE ARTHROSCOPY  06/14/2009   LUMBAR SPINE SURGERY     lumbar spine fusion L5-S1 performed 20 years ago x 3   TOTAL KNEE ARTHROPLASTY Right 11/04/2014   Procedure: RIGHT TOTAL KNEE ARTHROPLASTY;  Surgeon: Timothy Ford, MD;  Location:  [facility];  Service: Orthopedics;  Laterality: Right;   TOTAL VAGINAL HYSTERECTOMY  12/18/2001   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   Chest pain of uncertain etiology 10/28/2010   Hyperlipidemia 10/28/2010   Mitral valve prolapse 10/28/2010    PCP: Helyn Lobstein, MD   REFERRING PROVIDER: Timothy Ford, MD    REFERRING DIAG: [phone] (ICD-10-CM) - Degeneration of intervertebral disc of lumbar region with lower extremity pain    Rationale for Evaluation and Treatment: Rehabilitation  THERAPY DIAG:  Muscle weakness (generalized)  Radiculopathy, lumbosacral region  ONSET DATE: 04/2023  SUBJECTIVE:  SUBJECTIVE STATEMENT: Patient reported bilateral LBP with left LE pain that began late February, 2025 with no identifiable cause and her symptoms have been intermittent since onset. She reported intermittent shooting pain radiating from the left gluteal region down the posterior aspect of the entire leg since symptom onset. She noted diminished to absent sensation in the anterior thigh, posterior lower leg, and ankles and plantar surface of the foot. She reported the shooting pain is most pronounced when getting out of bed in the morning, as well as with prolonged sitting, standing, and walking. She reported feeling most unsteady whenever she walks and does not trust her balance without using an assistive device. Relieving factors include using a hot pack and pain medication. At time of evaluation, she reported her pain is a constant 3-4/10 pain and 9-10/10 pain at its worst. She also reported her pain is a 7/10 when getting in and out of bed. Pain limits her ability to perform ADL's. Her primary goals  are to be able to get in and out of bed, perform yard work, ADL's again with reduced pain.    PERTINENT HISTORY:    Osteoporosis, anxiety, hyperlipidemia, hypertension, heart murmur, bilateral TKA's per patient report, S-shaped Scoliosis, chronic kidney disease, diverticulosis, spinal fusion at L5-S1  PAIN:  Are you having pain? Yes: NPRS scale: constant 3-4/10 pain and 9-10/10 pain at its worst Pain location: Low back, left glute, posterior thigh and lower leg Pain description: Ache, throbbing, sharp, numb Aggravating factors: bed transfers, walking, sitting, standing for prolonged periods Relieving factors: hot pack and pain medication  PRECAUTIONS: Fall  RED FLAGS: None   WEIGHT BEARING RESTRICTIONS: No  FALLS:  Has patient fallen in last 6 months? No  LIVING ENVIRONMENT: Lives with: lives alone Lives in: House/apartment Stairs: Yes: External: 1 back porch steps; none Has following equipment at home: Single point cane and Environmental consultant - 2 wheeled  OCCUPATION: Retired   PLOF: Independent  PATIENT GOALS: Yard work, perform ADL's, driving without pain, decrease pain, improve movement, improve strength, stand longer, sit longer  NEXT MD VISIT: In two weeks  OBJECTIVE:  Note: Objective measures were completed at Evaluation unless otherwise noted.  DIAGNOSTIC FINDINGS:   MRI on 05/19/2023:   Degenerative changes of the lumbar spine as above. Disc bulge at L2-3 likely abuts the traversing left L3 nerve roots. Additional disc bulge and osteophytes at L3-4 likely abuts the traversing bilateral L4 nerve roots.   Multilevel foraminal stenosis, greatest and mild-to-moderate on the right at L1-2 and on the left at L4-5.   Degenerative endplate changes with associated discogenic edema at T11-12 and L3-4 which may contribute to back pain.   S shaped scoliosis of the thoracolumbar spine as above.  PATIENT SURVEYS:  Modified Oswestry 16/50   COGNITION: Overall cognitive  status:  WFL      SENSATION: Reflexes:  Left L3-4: 1+ Right L3-4: 1+  Left S1: 0 Right: 2+   Dermatomes:  WFL on the right LE Left L2: 1 diminished  Left L4: 1 diminished  Left L5: 1, diminished  Left S1: 0, absent   POSTURE: rounded shoulders  PALPATION: TTP over erector spinae muscles in lumbar region, L3-L4, L4-L5 facet joints, L3-5 spinous processes.   PASSIVE ACCESSORY MOTION:   Spinous processes: Pain and hypomobile at L2-L5 Left L3-L4 and L4-L5 facet joints: radiating pain and hypomobile  Right L3-L4 and L4-L5: pain and hypomobile  LUMBAR ROM:   AROM eval  Flexion 65 degrees, pain  Extension 12 degrees,  reproduced her pain  Right lateral flexion 17, pain   Left lateral flexion 11 degrees, reproduced her pain  Right rotation WFL, reproduced her pain  Left rotation WFL    (Blank rows = not tested)  LOWER EXTREMITY MMT:    MMT Right eval Left eval  Hip flexion 3 3  Hip extension    Hip abduction    Hip adduction    Hip internal rotation    Hip external rotation    Knee flexion 3 3  Knee extension 3+ 3  Ankle dorsiflexion    Ankle plantarflexion    Ankle inversion    Ankle eversion     (Blank rows = not tested)  LUMBAR SPECIAL TESTS:  Slump test: Positive  GAIT: Distance walked: 82 feet Assistive device utilized: Single point cane Level of assistance: Modified independence Comments: Patient demonstrated an antalgic step-through gait with reduced speed, stance time and stride length on left leg.   TREATMENT DATE:                                     07/12/23 EXERCISE LOG  Exercise Repetitions and Resistance Comments  Seated hip abduction  5 reps    Seated march  5 reps    Thoracolumbar ROTN AROM  5 reps             Blank cell = exercise not performed today                                                                                                                              PATIENT EDUCATION:  Education details: Patient educated on  HEP, objective findings, POC, prognosis and goals for therapy.  Person educated: Patient Education method: Medical illustrator Education comprehension: verbalized understanding and returned demonstration  HOME EXERCISE PROGRAM: [id number]  ASSESSMENT:  CLINICAL IMPRESSION: Patient is a 83 y.o. woman who was seen today for physical therapy evaluation and treatment for chronic LBP with left LE pain that began in late February of 2025. Objective findings of chronic LBP with left LE pain include diminished LE sensation and S1 MSR reflex, positive slump test, weakness with hip flexion, knee flexion and extension. Additionally the patient demonstrated an antalgic gait with left-sided deficits, pain and limitations with her lumbar spine AROM, TTP over erector spinae muscles in the lumbar region, painful and hypomobile passive accessory motion with L3-5 spinous processes and bilateral L3-L3/L4-L5 facet joints. She was provided with an HEP and verbalized and demonstrated compliance. Patient would benefit from skilled physical therapy to address impairments in order to facilitate safe return to ADL's.    OBJECTIVE IMPAIRMENTS: Abnormal gait, decreased activity tolerance, decreased balance, decreased coordination, decreased endurance, decreased mobility, difficulty walking, decreased ROM, decreased strength, hypomobility, impaired flexibility, impaired sensation, and pain.   ACTIVITY LIMITATIONS: carrying, lifting, bending, sitting, standing, squatting, sleeping, stairs, transfers, bed mobility, bathing, dressing,  and locomotion level  PARTICIPATION LIMITATIONS: meal prep, cleaning, laundry, driving, shopping, community activity, and yard work  PERSONAL FACTORS: Age, Time since onset of injury/illness/exacerbation, Transportation, and 3+ comorbidities: Osteoporosis, anxiety, hyperlipidemia, hypertension, heart murmur, bilateral TKA's per patient report, S-shaped Scoliosis, complication of anesthesia,  chronic kidney disease, spinal fusion at L5-S1  are also affecting patient's functional outcome.   REHAB POTENTIAL: Fair    CLINICAL DECISION MAKING: Unstable/unpredictable  EVALUATION COMPLEXITY: High   GOALS: Goals reviewed with patient? No  SHORT TERM GOALS: Target date: 08/02/23  Patient will be independent with her HEP.  Baseline: Goal status: INITIAL  2.  Patient's bilateral lateral flexion will increase to at least 25 degrees to enable pain-free reaching for items on the car floor and center console.   Baseline:  Goal status: INITIAL  3.  Patient's pain at its worst will decrease from 9-10/10 to 7-8/10 to ensure greater confidence in being able to stand for longer periods of time.  Baseline:  Goal status: INITIAL  4.  Patient's Modified Oswestry Disability Index score will reduce from 16/50 to at least 11/50 to ensure greater confidence in being able to perform ADL's.  Baseline:  Goal status: INITIAL  LONG TERM GOALS: Target date: 08/23/23  Patient will be independent with advanced HEP.  Baseline:  Goal status: INITIAL  2.  Patient's left knee flexion and extension MMT will increase to at least 4 which will help normalize gait mechanics.  Baseline:  Goal status: INITIAL  3. Patient will report her pain with bed mobility will decrease from 7/10 to a 5/10.  Baseline:  Goal status: INITIAL  4.  Patient's left hip flexion MMT will increase to at least 4 to enhance her ability to ascend and descend stairs.  Baseline:  Goal status: INITIAL   PLAN:  PT FREQUENCY: 1-2x/week  PT DURATION: 6 weeks  PLANNED INTERVENTIONS: 97164- PT Re-evaluation, 97750- Physical Performance Testing, 97110-Therapeutic exercises, 97530- Therapeutic activity, [id number]- Neuromuscular re-education, 97535- Self Care, [id number]- Manual therapy, [phone]- Gait training, [phone]- Electrical stimulation (unattended), Patient/Family education, Balance training, Stair training, Joint mobilization, Spinal  mobilization, Cryotherapy, and Moist heat.  PLAN FOR NEXT SESSION: Nustep, core strengthening, low back iso's, modalities as needed.    Rebeccah Ivins, Student-PT 07/12/2023, 2:40 PM

## 2023-07-18 ENCOUNTER — Encounter: Payer: Self-pay | Admitting: Physical Medicine and Rehabilitation

## 2023-07-18 ENCOUNTER — Other Ambulatory Visit: Payer: Self-pay | Admitting: Physical Medicine and Rehabilitation

## 2023-07-18 DIAGNOSIS — H353131 Nonexudative age-related macular degeneration, bilateral, early dry stage: Secondary | ICD-10-CM | POA: Diagnosis not present

## 2023-07-18 DIAGNOSIS — H11821 Conjunctivochalasis, right eye: Secondary | ICD-10-CM | POA: Diagnosis not present

## 2023-07-18 DIAGNOSIS — M5416 Radiculopathy, lumbar region: Secondary | ICD-10-CM

## 2023-07-18 DIAGNOSIS — H26491 Other secondary cataract, right eye: Secondary | ICD-10-CM | POA: Diagnosis not present

## 2023-07-18 DIAGNOSIS — H401131 Primary open-angle glaucoma, bilateral, mild stage: Secondary | ICD-10-CM | POA: Diagnosis not present

## 2023-07-18 MED ORDER — TRIAZOLAM 0.25 MG PO TABS: ORAL_TABLET | ORAL | 0 refills | Status: DC

## 2023-07-24 ENCOUNTER — Ambulatory Visit

## 2023-07-24 DIAGNOSIS — M5417 Radiculopathy, lumbosacral region: Secondary | ICD-10-CM | POA: Diagnosis not present

## 2023-07-24 DIAGNOSIS — M51361 Other intervertebral disc degeneration, lumbar region with lower extremity pain only: Secondary | ICD-10-CM | POA: Diagnosis not present

## 2023-07-24 DIAGNOSIS — M6281 Muscle weakness (generalized): Secondary | ICD-10-CM | POA: Diagnosis not present

## 2023-07-24 NOTE — Therapy (Signed)
 OUTPATIENT PHYSICAL THERAPY THORACOLUMBAR TREATMENT   Patient Name: Susan Nunez MRN: [id number] DOB:24-Mar-1940, 83 y.o., female Today's Date: 07/24/2023  END OF SESSION:  PT End of Session - 07/24/23 1036     Visit Number 2    Number of Visits 12    Date for PT Re-Evaluation 08/24/23    PT Start Time 1015    PT Stop Time 1057    PT Time Calculation (min) 42 min    Activity Tolerance Patient tolerated treatment well    Behavior During Therapy WFL for tasks assessed/performed              Past Medical History:  Diagnosis Date   Anxiety    when husband was ill and died   Arthritis    Cancer (HCC)    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 EF at 65% -- mooth and normal coronary arteries -- Normal left ventricular systolic function.  We will continue with medical therapy.  I suspect her chest pain was due to her mitral valve prolapse syndrome --  [place], M.D.    CHOLECYSTECTOMY N/A 01/15/2020   Procedure: LAPAROSCOPIC CHOLECYSTECTOMY WITH INTRAOPERATIVE CHOLANGIOGRAM;  Surgeon: Oralee Billow, MD;  Location: [facility];  Service: General;  Laterality: N/A;   FOOT SURGERY  07/12/00   right   JOINT REPLACEMENT     KNEE ARTHROSCOPY  06/14/2009   LUMBAR SPINE SURGERY     lumbar spine fusion L5-S1 performed 20 years ago x 3   TOTAL KNEE ARTHROPLASTY Right 11/04/2014   Procedure: RIGHT TOTAL KNEE ARTHROPLASTY;  Surgeon: Timothy Ford, MD;   Location: [facility];  Service: Orthopedics;  Laterality: Right;   TOTAL VAGINAL HYSTERECTOMY  12/18/2001   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 However, she is scheduled for another injection next week.   PERTINENT HISTORY:    Osteoporosis, anxiety, hyperlipidemia, hypertension, heart murmur, bilateral TKA's per patient report, S-shaped Scoliosis, chronic kidney disease, diverticulosis, spinal fusion at L5-S1  PAIN:  Are you having pain? Yes: NPRS scale: 3/10 Pain location: Low back, left glute, posterior thigh and lower leg Pain description: Ache, throbbing, sharp, numb Aggravating factors: bed transfers, walking, sitting, standing for prolonged periods Relieving factors: hot pack and pain medication  PRECAUTIONS: Fall  RED FLAGS: None   WEIGHT BEARING RESTRICTIONS: No  FALLS:  Has patient fallen in last 6 months? No  LIVING ENVIRONMENT: Lives with: lives alone Lives in: House/apartment Stairs: Yes: External: 1 back porch steps; none Has following equipment at home: Single point cane and Environmental consultant - 2 wheeled  OCCUPATION: Retired   PLOF:  Independent  PATIENT GOALS: Yard work, perform ADL's, driving without pain, decrease pain, improve movement, improve strength, stand longer, sit longer  NEXT MD VISIT: In two weeks  OBJECTIVE:  Note: Objective measures were completed at Evaluation unless otherwise noted.  DIAGNOSTIC FINDINGS:   MRI on 05/19/2023:   Degenerative changes of the lumbar spine as above. Disc bulge at L2-3 likely abuts the traversing left L3 nerve roots. Additional disc bulge and osteophytes at L3-4 likely abuts the traversing bilateral L4 nerve roots.   Multilevel foraminal stenosis, greatest and mild-to-moderate on the right at L1-2 and on the left at L4-5.   Degenerative endplate changes with associated discogenic edema at T11-12 and L3-4 which may contribute to back pain.   S shaped scoliosis of the thoracolumbar spine as above.  PATIENT SURVEYS:  Modified Oswestry 16/50   COGNITION: Overall cognitive status: WFL     SENSATION: Reflexes:  Left L3-4: 1+ Right L3-4: 1+  Left S1: 0 Right: 2+   Dermatomes:  WFL on the right LE Left L2: 1 diminished  Left L4: 1 diminished  Left L5: 1, diminished  Left S1: 0, absent   POSTURE: rounded shoulders  PALPATION: TTP over erector spinae muscles in lumbar region, L3-L4, L4-L5 facet joints, L3-5 spinous processes.   PASSIVE ACCESSORY MOTION:   Spinous processes: Pain and hypomobile at L2-L5 Left L3-L4 and L4-L5 facet joints: radiating pain and hypomobile  Right L3-L4 and L4-L5: pain and hypomobile  LUMBAR ROM:   AROM eval  Flexion 65 degrees, pain  Extension 12 degrees, reproduced her pain  Right lateral flexion 17, pain   Left lateral flexion 11 degrees, reproduced her pain  Right rotation WFL, reproduced her pain  Left rotation WFL    (Blank rows = not tested)  LOWER EXTREMITY MMT:    MMT Right eval Left eval  Hip flexion 3 3  Hip extension    Hip abduction    Hip adduction    Hip internal rotation    Hip external rotation     Knee flexion 3 3  Knee extension 3+ 3  Ankle dorsiflexion    Ankle plantarflexion    Ankle inversion    Ankle eversion     (Blank rows = not tested)  LUMBAR SPECIAL TESTS:  Slump test: Positive  GAIT: Distance walked: 82 feet Assistive device utilized: Single point cane Level of assistance: Modified independence Comments: Patient demonstrated an antalgic step-through gait with reduced speed, stance time and stride length on left leg.   TREATMENT DATE:  07/24/23 EXERCISE LOG  Exercise Repetitions and Resistance Comments  Nustep  L3 x 15 minutes   Seated hip ADD isometric  2 minutes w/ 5 second hold   LAQ 20 reps each  Alternating LE  Slouch overcorrect  2 minutes   Rocker board (seated) 5 minutes     Blank cell = exercise not performed today                                    07/12/23 EXERCISE LOG  Exercise Repetitions and Resistance Comments  Seated hip abduction  5 reps    Seated march  5 reps    Thoracolumbar ROTN AROM  5 reps             Blank cell = exercise not performed today                                                                                                                              PATIENT EDUCATION:  Education details: HEP, prognosis, referred pain, and healing Person educated: Patient Education method: Explanation Education comprehension: verbalized understanding  HOME EXERCISE PROGRAM: [id number]  ASSESSMENT:  CLINICAL IMPRESSION: Patient was introduced to multiple new interventions for lumbar and lower extremity mobility and stability. She required minimal cueing with today's new interventions for proper exercise performance. She was educated on referred pain and how this relates to her symptoms. She reported understanding. She reported feeling "a little better" upon the conclusion of treatment. She continues to require skilled physical therapy to address her remaining impairments to maximize her  functional mobility.   OBJECTIVE IMPAIRMENTS: Abnormal gait, decreased activity tolerance, decreased balance, decreased coordination, decreased endurance, decreased mobility, difficulty walking, decreased ROM, decreased strength, hypomobility, impaired flexibility, impaired sensation, and pain.   ACTIVITY LIMITATIONS: carrying, lifting, bending, sitting, standing, squatting, sleeping, stairs, transfers, bed mobility, bathing, dressing, and locomotion level  PARTICIPATION LIMITATIONS: meal prep, cleaning, laundry, driving, shopping, community activity, and yard work  PERSONAL FACTORS: Age, Time since onset of injury/illness/exacerbation, Transportation, and 3+ comorbidities: Osteoporosis, anxiety, hyperlipidemia, hypertension, heart murmur, bilateral TKA's per patient report, S-shaped Scoliosis, complication of anesthesia, chronic kidney disease, spinal fusion at L5-S1 are also affecting patient's functional outcome.   REHAB POTENTIAL: Fair    CLINICAL DECISION MAKING: Unstable/unpredictable  EVALUATION COMPLEXITY: High   GOALS: Goals reviewed with patient? No  SHORT TERM GOALS: Target date: 08/02/23  Patient will be independent with her HEP.  Baseline: Goal status: INITIAL  2.  Patient's bilateral lateral flexion will increase to at least 25 degrees to enable pain-free reaching for items on the car floor and center console.   Baseline:  Goal status: INITIAL  3.  Patient's pain at its worst will decrease from 9-10/10 to 7-8/10 to ensure greater confidence in being able to stand for longer periods of time.  Baseline:  Goal status: INITIAL  4.  Patient's Modified  Oswestry Disability Index score will reduce from 16/50 to at least 11/50 to ensure greater confidence in being able to perform ADL's.  Baseline:  Goal status: INITIAL  LONG TERM GOALS: Target date: 08/23/23  Patient will be independent with advanced HEP.  Baseline:  Goal status: INITIAL  2.  Patient's left knee  flexion and extension MMT will increase to at least 4 which will help normalize gait mechanics.  Baseline:  Goal status: INITIAL  3. Patient will report her pain with bed mobility will decrease from 7/10 to a 5/10.  Baseline:  Goal status: INITIAL  4.  Patient's left hip flexion MMT will increase to at least 4 to enhance her ability to ascend and descend stairs.  Baseline:  Goal status: INITIAL   PLAN:  PT FREQUENCY: 1-2x/week  PT DURATION: 6 weeks  PLANNED INTERVENTIONS: 97164- PT Re-evaluation, 97750- Physical Performance Testing, 97110-Therapeutic exercises, 97530- Therapeutic activity, [id number]- Neuromuscular re-education, 97535- Self Care, 16109- Manual therapy, [phone]- Gait training, [phone]- Electrical stimulation (unattended), Patient/Family education, Balance training, Stair training, Joint mobilization, Spinal mobilization, Cryotherapy, and Moist heat.  PLAN FOR NEXT SESSION: Nustep, core strengthening, low back iso's, modalities as needed.    Lane Pinon, PT 07/24/2023, 3:34 PM

## 2023-07-25 DIAGNOSIS — H2513 Age-related nuclear cataract, bilateral: Secondary | ICD-10-CM | POA: Diagnosis not present

## 2023-07-26 ENCOUNTER — Ambulatory Visit

## 2023-07-26 DIAGNOSIS — M5417 Radiculopathy, lumbosacral region: Secondary | ICD-10-CM

## 2023-07-26 DIAGNOSIS — M6281 Muscle weakness (generalized): Secondary | ICD-10-CM

## 2023-07-26 DIAGNOSIS — M51361 Other intervertebral disc degeneration, lumbar region with lower extremity pain only: Secondary | ICD-10-CM | POA: Diagnosis not present

## 2023-07-26 NOTE — Therapy (Signed)
 OUTPATIENT PHYSICAL THERAPY THORACOLUMBAR TREATMENT   Patient Name: Susan Nunez MRN: [id number] DOB:Dec 16, 1940, 83 y.o., female Today's Date: 07/26/2023  END OF SESSION:  PT End of Session - 07/26/23 1019     Visit Number 3    Number of Visits 12    Date for PT Re-Evaluation 08/24/23    PT Start Time 1015    PT Stop Time 1058    PT Time Calculation (min) 43 min    Activity Tolerance Patient tolerated treatment well    Behavior During Therapy WFL for tasks assessed/performed               Past Medical History:  Diagnosis Date   Anxiety    when husband was ill and died   Arthritis    Cancer (HCC)    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 EF at 65% -- mooth and normal coronary arteries -- Normal left ventricular systolic function.  We will continue with medical therapy.  I suspect her chest pain was due to her mitral valve prolapse syndrome --  [place], M.D.    CHOLECYSTECTOMY N/A 01/15/2020   Procedure: LAPAROSCOPIC CHOLECYSTECTOMY WITH INTRAOPERATIVE CHOLANGIOGRAM;  Surgeon: Oralee Billow, MD;  Location: [facility];  Service: General;  Laterality: N/A;   FOOT SURGERY  07/12/00   right   JOINT REPLACEMENT     KNEE ARTHROSCOPY  06/14/2009   LUMBAR SPINE SURGERY     lumbar spine fusion L5-S1 performed 20 years ago x 3   TOTAL KNEE ARTHROPLASTY Right 11/04/2014   Procedure: RIGHT TOTAL KNEE ARTHROPLASTY;  Surgeon: Timothy Ford, MD;   Location: [facility];  Service: Orthopedics;  Laterality: Right;   TOTAL VAGINAL HYSTERECTOMY  12/18/2001   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   Chest pain of uncertain etiology 10/28/2010   Hyperlipidemia 10/28/2010   Mitral valve prolapse 10/28/2010    PCP: Helyn Lobstein, MD   REFERRING PROVIDER: Timothy Ford, MD    REFERRING DIAG: [phone] (ICD-10-CM) - Degeneration of intervertebral disc of lumbar region with lower extremity pain    Rationale for Evaluation and Treatment: Rehabilitation  THERAPY DIAG:  Radiculopathy, lumbosacral region  Muscle weakness (generalized)  ONSET DATE: 04/2023  SUBJECTIVE:  SUBJECTIVE STATEMENT: Patient reports that she is hurting a little more today because she has been active this morning. She hurt a little more for about a hour after her last appointment, but it eased up within six hours after her appointment.   PERTINENT HISTORY:    Osteoporosis, anxiety, hyperlipidemia, hypertension, heart murmur, bilateral TKA's per patient report, S-shaped Scoliosis, chronic kidney disease, diverticulosis, spinal fusion at L5-S1  PAIN:  Are you having pain? Yes: NPRS scale: 3/10 Pain location: Low back, left glute, posterior thigh and lower leg Pain description: Ache, throbbing, sharp, numb Aggravating factors: bed transfers, walking, sitting, standing for prolonged periods Relieving factors: hot pack and pain medication  PRECAUTIONS: Fall  RED FLAGS: None   WEIGHT BEARING RESTRICTIONS: No  FALLS:  Has patient fallen in last 6 months? No  LIVING ENVIRONMENT: Lives with: lives alone Lives in: House/apartment Stairs: Yes: External: 1 back porch steps;  none Has following equipment at home: Single point cane and Environmental consultant - 2 wheeled  OCCUPATION: Retired   PLOF: Independent  PATIENT GOALS: Yard work, perform ADL's, driving without pain, decrease pain, improve movement, improve strength, stand longer, sit longer  NEXT MD VISIT: In two weeks  OBJECTIVE:  Note: Objective measures were completed at Evaluation unless otherwise noted.  DIAGNOSTIC FINDINGS:   MRI on 05/19/2023:   Degenerative changes of the lumbar spine as above. Disc bulge at L2-3 likely abuts the traversing left L3 nerve roots. Additional disc bulge and osteophytes at L3-4 likely abuts the traversing bilateral L4 nerve roots.   Multilevel foraminal stenosis, greatest and mild-to-moderate on the right at L1-2 and on the left at L4-5.   Degenerative endplate changes with associated discogenic edema at T11-12 and L3-4 which may contribute to back pain.   S shaped scoliosis of the thoracolumbar spine as above.  PATIENT SURVEYS:  Modified Oswestry 16/50   COGNITION: Overall cognitive status: WFL     SENSATION: Reflexes:  Left L3-4: 1+ Right L3-4: 1+  Left S1: 0 Right: 2+   Dermatomes:  WFL on the right LE Left L2: 1 diminished  Left L4: 1 diminished  Left L5: 1, diminished  Left S1: 0, absent   POSTURE: rounded shoulders  PALPATION: TTP over erector spinae muscles in lumbar region, L3-L4, L4-L5 facet joints, L3-5 spinous processes.   PASSIVE ACCESSORY MOTION:   Spinous processes: Pain and hypomobile at L2-L5 Left L3-L4 and L4-L5 facet joints: radiating pain and hypomobile  Right L3-L4 and L4-L5: pain and hypomobile  LUMBAR ROM:   AROM eval  Flexion 65 degrees, pain  Extension 12 degrees, reproduced her pain  Right lateral flexion 17, pain   Left lateral flexion 11 degrees, reproduced her pain  Right rotation WFL, reproduced her pain  Left rotation WFL    (Blank rows = not tested)  LOWER EXTREMITY MMT:    MMT Right eval Left eval  Hip  flexion 3 3  Hip extension    Hip abduction    Hip adduction    Hip internal rotation    Hip external rotation    Knee flexion 3 3  Knee extension 3+ 3  Ankle dorsiflexion    Ankle plantarflexion    Ankle inversion    Ankle eversion     (Blank rows = not tested)  LUMBAR SPECIAL TESTS:  Slump test: Positive  GAIT: Distance walked: 82 feet Assistive device utilized: Single point cane Level of assistance: Modified independence Comments: Patient demonstrated an antalgic step-through gait with reduced speed, stance time  and stride length on left leg.   TREATMENT DATE:                                    07/26/23 EXERCISE LOG  Exercise Repetitions and Resistance Comments  Nustep  L1 x 16 minutes   Seated HS stretch  4 x 30 seconds  LLE only  Seated gastroc stretch  4 x 30 seconds  LLE only   Rocker board (seated)  4.5 minutes   L ankle PF  Green t-band x 2 minutes   Single knee to chest  1 x 15 seconds each     Blank cell = exercise not performed today                                    07/24/23 EXERCISE LOG  Exercise Repetitions and Resistance Comments  Nustep  L3 x 15 minutes   Seated hip ADD isometric  2 minutes w/ 5 second hold   LAQ 20 reps each  Alternating LE  Slouch overcorrect  2 minutes   Rocker board (seated) 5 minutes     Blank cell = exercise not performed today                                    07/12/23 EXERCISE LOG  Exercise Repetitions and Resistance Comments  Seated hip abduction  5 reps    Seated march  5 reps    Thoracolumbar ROTN AROM  5 reps             Blank cell = exercise not performed today                                                                                                                              PATIENT EDUCATION:  Education details: HEP, prognosis, referred pain, and healing Person educated: Patient Education method: Explanation Education comprehension: verbalized understanding  HOME EXERCISE  PROGRAM: [id number]  ASSESSMENT:  CLINICAL IMPRESSION: Patient was introduced to multiple new seated and supine for improved soft tissue extensibility. She required minimal cueing with today's new interventions for proper exercise performance. She experienced no significant change in her familiar symptoms with any of today's interventions. She reported feeling alright upon the conclusion of treatment. She continues to require skilled physical therapy to address her remaining impairments to maximize her functional mobility.   OBJECTIVE IMPAIRMENTS: Abnormal gait, decreased activity tolerance, decreased balance, decreased coordination, decreased endurance, decreased mobility, difficulty walking, decreased ROM, decreased strength, hypomobility, impaired flexibility, impaired sensation, and pain.   ACTIVITY LIMITATIONS: carrying, lifting, bending, sitting, standing, squatting, sleeping, stairs, transfers, bed mobility, bathing, dressing, and locomotion level  PARTICIPATION LIMITATIONS: meal prep, cleaning, laundry, driving, shopping, community activity, and yard work  PERSONAL FACTORS: Age, Time since onset of injury/illness/exacerbation, Transportation, and 3+ comorbidities: Osteoporosis, anxiety, hyperlipidemia, hypertension, heart murmur, bilateral TKA's per patient report, S-shaped Scoliosis, complication of anesthesia, chronic kidney disease, spinal fusion at L5-S1 are also affecting patient's functional outcome.   REHAB POTENTIAL: Fair    CLINICAL DECISION MAKING: Unstable/unpredictable  EVALUATION COMPLEXITY: High   GOALS: Goals reviewed with patient? No  SHORT TERM GOALS: Target date: 08/02/23  Patient will be independent with her HEP.  Baseline: Goal status: INITIAL  2.  Patient's bilateral lateral flexion will increase to at least 25 degrees to enable pain-free reaching for items on the car floor and center console.   Baseline:  Goal status: INITIAL  3.  Patient's pain at its  worst will decrease from 9-10/10 to 7-8/10 to ensure greater confidence in being able to stand for longer periods of time.  Baseline:  Goal status: INITIAL  4.  Patient's Modified Oswestry Disability Index score will reduce from 16/50 to at least 11/50 to ensure greater confidence in being able to perform ADL's.  Baseline:  Goal status: INITIAL  LONG TERM GOALS: Target date: 08/23/23  Patient will be independent with advanced HEP.  Baseline:  Goal status: INITIAL  2.  Patient's left knee flexion and extension MMT will increase to at least 4 which will help normalize gait mechanics.  Baseline:  Goal status: INITIAL  3. Patient will report her pain with bed mobility will decrease from 7/10 to a 5/10.  Baseline:  Goal status: INITIAL  4.  Patient's left hip flexion MMT will increase to at least 4 to enhance her ability to ascend and descend stairs.  Baseline:  Goal status: INITIAL   PLAN:  PT FREQUENCY: 1-2x/week  PT DURATION: 6 weeks  PLANNED INTERVENTIONS: 97164- PT Re-evaluation, 97750- Physical Performance Testing, 97110-Therapeutic exercises, 97530- Therapeutic activity, [id number]- Neuromuscular re-education, 97535- Self Care, 16109- Manual therapy, [phone]- Gait training, [phone]- Electrical stimulation (unattended), Patient/Family education, Balance training, Stair training, Joint mobilization, Spinal mobilization, Cryotherapy, and Moist heat.  PLAN FOR NEXT SESSION: Nustep, core strengthening, low back iso's, modalities as needed.    Lane Pinon, PT 07/26/2023, 12:35 PM

## 2023-07-31 ENCOUNTER — Encounter

## 2023-08-02 ENCOUNTER — Ambulatory Visit (INDEPENDENT_AMBULATORY_CARE_PROVIDER_SITE_OTHER): Admitting: Physical Medicine and Rehabilitation

## 2023-08-02 ENCOUNTER — Other Ambulatory Visit: Payer: Self-pay

## 2023-08-02 DIAGNOSIS — M5416 Radiculopathy, lumbar region: Secondary | ICD-10-CM | POA: Diagnosis not present

## 2023-08-02 MED ORDER — METHYLPREDNISOLONE ACETATE 40 MG/ML IJ SUSP
40.0000 mg | Freq: Once | INTRAMUSCULAR | Status: AC
Start: 2023-08-02 — End: 2023-08-02
  Administered 2023-08-02: 40 mg

## 2023-08-02 NOTE — Patient Instructions (Signed)

## 2023-08-02 NOTE — Progress Notes (Unsigned)
 Last injection helped. Feels at least 75% better. Still having some numbness, tingling and irritation Pain Scale   Average Pain 3      Today pain is a 3/4  +Driver, -BT, -Dye Allergies. Aaron Aas

## 2023-08-08 NOTE — Procedures (Signed)
 Lumbar Epidural Steroid Injection - Interlaminar Approach with Fluoroscopic Guidance  Patient: Susan Nunez      Date of Birth: 1940-11-02 MRN: [id number] PCP: Helyn Lobstein, MD      Visit Date: 08/02/2023   Universal Protocol:     Consent Given By: the patient  Position: PRONE  Additional Comments: Vital signs were monitored before and after the procedure. Patient was prepped and draped in the usual sterile fashion. The correct patient, procedure, and site was verified.   Injection Procedure Details:   Procedure diagnoses: Lumbar radiculopathy [M54.16]   Meds Administered:  Meds ordered this encounter  Medications   methylPREDNISolone  acetate (DEPO-MEDROL ) injection 40 mg     Laterality: Left  Location/Site:  L4-5  Needle: 3.5 in., 20 ga. Tuohy  Needle Placement: Paramedian epidural  Findings:   -Comments: Excellent flow of contrast into the epidural space.  Procedure Details: Using a paramedian approach from the side mentioned above, the region overlying the inferior lamina was localized under fluoroscopic visualization and the soft tissues overlying this structure were infiltrated with 4 ml. of 1% Lidocaine  without Epinephrine . The Tuohy needle was inserted into the epidural space using a paramedian approach.   The epidural space was localized using loss of resistance along with counter oblique bi-planar fluoroscopic views.  After negative aspirate for air, blood, and CSF, a 2 ml. volume of Isovue-250 was injected into the epidural space and the flow of contrast was observed. Radiographs were obtained for documentation purposes.    The injectate was administered into the level noted above.   Additional Comments:  The patient tolerated the procedure well Dressing: 2 x 2 sterile gauze and Band-Aid    Post-procedure details: Patient was observed during the procedure. Post-procedure instructions were reviewed.  Patient left the clinic in stable condition.

## 2023-08-08 NOTE — Progress Notes (Signed)
 Susan Nunez - 83 y.o. female MRN [id number]  Date of birth: 1940-09-27  Office Visit Note: Visit Date: 08/02/2023 PCP: Helyn Lobstein, MD Referred by: Helyn Lobstein, MD  Subjective: Chief Complaint  Patient presents with   Back Pain   HPI:  Susan Nunez is a 83 y.o. female who comes in today for planned repeat Left L4-5  Lumbar Interlaminar epidural steroid injection with fluoroscopic guidance.  The patient has failed conservative care including home exercise, medications, time and activity modification.  This injection will be diagnostic and hopefully therapeutic.  Please see requesting physician notes for further details and justification. Patient received more than 50% pain relief from prior injection.   Referring: Elvan Hamel, FNP   ROS Otherwise per HPI.  Assessment & Plan: Visit Diagnoses:    ICD-10-CM   1. Lumbar radiculopathy  M54.16 XR C-ARM NO REPORT    Epidural Steroid injection    methylPREDNISolone  acetate (DEPO-MEDROL ) injection 40 mg      Plan: No additional findings.   Meds & Orders:  Meds ordered this encounter  Medications   methylPREDNISolone  acetate (DEPO-MEDROL ) injection 40 mg    Orders Placed This Encounter  Procedures   XR C-ARM NO REPORT   Epidural Steroid injection    Follow-up: Return for visit to requesting provider as needed.   Procedures: No procedures performed  Lumbar Epidural Steroid Injection - Interlaminar Approach with Fluoroscopic Guidance  Patient: Susan Nunez      Date of Birth: 03/08/1941 MRN: [id number] PCP: Helyn Lobstein, MD      Visit Date: 08/02/2023   Universal Protocol:     Consent Given By: the patient  Position: PRONE  Additional Comments: Vital signs were monitored before and after the procedure. Patient was prepped and draped in the usual sterile fashion. The correct patient, procedure, and site was verified.   Injection Procedure Details:   Procedure diagnoses: Lumbar radiculopathy  [M54.16]   Meds Administered:  Meds ordered this encounter  Medications   methylPREDNISolone  acetate (DEPO-MEDROL ) injection 40 mg     Laterality: Left  Location/Site:  L4-5  Needle: 3.5 in., 20 ga. Tuohy  Needle Placement: Paramedian epidural  Findings:   -Comments: Excellent flow of contrast into the epidural space.  Procedure Details: Using a paramedian approach from the side mentioned above, the region overlying the inferior lamina was localized under fluoroscopic visualization and the soft tissues overlying this structure were infiltrated with 4 ml. of 1% Lidocaine  without Epinephrine . The Tuohy needle was inserted into the epidural space using a paramedian approach.   The epidural space was localized using loss of resistance along with counter oblique bi-planar fluoroscopic views.  After negative aspirate for air, blood, and CSF, a 2 ml. volume of Isovue-250 was injected into the epidural space and the flow of contrast was observed. Radiographs were obtained for documentation purposes.    The injectate was administered into the level noted above.   Additional Comments:  The patient tolerated the procedure well Dressing: 2 x 2 sterile gauze and Band-Aid    Post-procedure details: Patient was observed during the procedure. Post-procedure instructions were reviewed.  Patient left the clinic in stable condition.   Clinical History: MRI LUMBAR SPINE WITHOUT CONTRAST   TECHNIQUE: Multiplanar, multisequence MR imaging of the lumbar spine was performed. No intravenous contrast was administered.   COMPARISON:  Lumbar spine radiograph 05/03/2023   FINDINGS: Segmentation:  Standard.   Alignment: Straightening of the normal lumbar lordosis. Trace retrolisthesis of L3 on L4.  S shaped scoliosis of the thoracolumbar spine. There is leftward convex curvature centered at T12-L1 and rightward convex curvature centered at L3-4.   Vertebrae: Modic type 1 degenerative endplate  changes with associated discogenic edema at T11-12 more pronounced on the right. Additional degenerative endplate changes and discogenic edema at L3-4. No evidence of fracture. Heterogeneous bone marrow signal intensity with multiple areas of fatty marrow versus hemangiomas. No suspicious osseous lesion appreciated.   Conus medullaris and cauda equina: Conus extends to the mid/lower L2 level. Conus and cauda equina appear normal.   Paraspinal and other soft tissues: Mild atrophy of the paraspinal musculature. Multiple parapelvic cysts in the left kidney are similar to prior. Mild prominence of the left renal pelvis. Scattered diverticuli along the partially visualized descending colon.   Disc levels:   T12-L1: Diffuse disc bulge and superimposed left subarticular disc protrusion which indents the ventral thecal sac. Bilateral facet arthrosis. Mild spinal canal stenosis. No significant foraminal stenosis.   L1-2: Diffuse disc bulge slightly eccentric to the right which indents the ventral thecal sac without significant spinal canal stenosis. Moderate bilateral facet arthrosis. Mild-to-moderate foraminal stenosis on the right secondary to prominent facet osteophytes.   L2-3: Mild disc height loss. Diffuse disc bulge and posterior osteophytic ridging which indents the ventral thecal sac resulting in narrowing of the lateral recesses. Disc bulge likely abuts the traversing left L3 nerve roots. Moderate facet arthrosis. Mild bilateral foraminal stenosis.   L3-4: Severe disc height loss. Disc bulge and posterior osteophytic ridging which indents the ventral thecal sac resulting in narrowing of the lateral recesses. Disc bulge and osteophytes possibly contacts the traversing bilateral L4 nerve roots. Moderate facet arthrosis. Mild spinal canal stenosis. Mild bilateral foraminal stenosis.   L4-5: Diffuse disc bulge and prominent central disc protrusion which indents the ventral  thecal sac. Moderate facet arthrosis. Mild spinal canal stenosis. There is mild narrowing of the left lateral recess without mass effect on the traversing nerve roots. Mild-to-moderate left foraminal stenosis.   L5-S1: Severe disc space narrowing with partial fusion of the vertebral bodies. No significant spinal canal or foraminal stenosis.   IMPRESSION: Degenerative changes of the lumbar spine as above. Disc bulge at L2-3 likely abuts the traversing left L3 nerve roots. Additional disc bulge and osteophytes at L3-4 likely abuts the traversing bilateral L4 nerve roots.   Multilevel foraminal stenosis, greatest and mild-to-moderate on the right at L1-2 and on the left at L4-5.   Degenerative endplate changes with associated discogenic edema at T11-12 and L3-4 which may contribute to back pain.   S shaped scoliosis of the thoracolumbar spine as above.     Electronically Signed   By: Denny Flack M.D.   On: 05/30/2023 13:09     Objective:  VS:  HT:    WT:   BMI:     BP:   HR: bpm  TEMP: ( )  RESP:  Physical Exam Vitals and nursing note reviewed.  Constitutional:      General: She is not in acute distress.    Appearance: Normal appearance. She is not ill-appearing.  HENT:     Head: Normocephalic and atraumatic.     Right Ear: External ear normal.     Left Ear: External ear normal.  Eyes:     Extraocular Movements: Extraocular movements intact.  Cardiovascular:     Rate and Rhythm: Normal rate.     Pulses: Normal pulses.  Pulmonary:     Effort: Pulmonary effort is normal. No  respiratory distress.  Abdominal:     General: There is no distension.     Palpations: Abdomen is soft.  Musculoskeletal:        General: Tenderness present.     Cervical back: Neck supple.     Right lower leg: No edema.     Left lower leg: No edema.     Comments: Patient has good distal strength with no pain over the greater trochanters.  No clonus or focal weakness.  Skin:    Findings:  No erythema, lesion or rash.  Neurological:     General: No focal deficit present.     Mental Status: She is alert and oriented to person, place, and time.     Sensory: No sensory deficit.     Motor: No weakness or abnormal muscle tone.     Coordination: Coordination normal.  Psychiatric:        Mood and Affect: Mood normal.        Behavior: Behavior normal.      Imaging: No results found.

## 2023-08-09 ENCOUNTER — Ambulatory Visit

## 2023-08-09 DIAGNOSIS — M51361 Other intervertebral disc degeneration, lumbar region with lower extremity pain only: Secondary | ICD-10-CM | POA: Diagnosis not present

## 2023-08-09 DIAGNOSIS — M5417 Radiculopathy, lumbosacral region: Secondary | ICD-10-CM

## 2023-08-09 DIAGNOSIS — M6281 Muscle weakness (generalized): Secondary | ICD-10-CM | POA: Diagnosis not present

## 2023-08-09 NOTE — Therapy (Signed)
 OUTPATIENT PHYSICAL THERAPY THORACOLUMBAR TREATMENT   Patient Name: Susan Nunez MRN: [id number] DOB:[birth year], 83 y.o., female Today's Date: 08/09/2023  END OF SESSION:  PT End of Session - 08/09/23 1019     Visit Number 4    Number of Visits 12    Date for PT Re-Evaluation 08/24/23    Authorization Time Period 07/12/23-09/10/23    Authorization - Number of Visits 10    PT Start Time 1014    PT Stop Time 1100    PT Time Calculation (min) 46 min    Activity Tolerance Patient tolerated treatment well    Behavior During Therapy WFL for tasks assessed/performed                Past Medical History:  Diagnosis Date   Anxiety    when husband was ill and died   Arthritis    Cancer (HCC)    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 EF at 65% -- mooth and normal coronary arteries -- Normal left ventricular systolic function.  We will continue with medical therapy.  I suspect her chest pain was due to her mitral valve prolapse syndrome --  [place], M.D.    CHOLECYSTECTOMY N/A 01/15/2020   Procedure: LAPAROSCOPIC CHOLECYSTECTOMY WITH INTRAOPERATIVE CHOLANGIOGRAM;  Surgeon: Oralee Billow, MD;  Location: [facility];  Service: General;  Laterality: N/A;   FOOT SURGERY  07/12/00   right   JOINT REPLACEMENT     KNEE ARTHROSCOPY  06/14/2009   LUMBAR SPINE SURGERY     lumbar spine fusion L5-S1 performed 20 years ago x 3   TOTAL KNEE ARTHROPLASTY Right  11/04/2014   Procedure: RIGHT TOTAL KNEE ARTHROPLASTY;  Surgeon: Timothy Ford, MD;  Location: [facility];  Service: Orthopedics;  Laterality: Right;   TOTAL VAGINAL HYSTERECTOMY  12/18/2001   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 However, she has gotten another injection which has helped some. She feels that she has gotten some better since starting physical therapy.   PERTINENT HISTORY:    Osteoporosis, anxiety, hyperlipidemia, hypertension, heart murmur, bilateral TKA's per patient report, S-shaped Scoliosis, chronic kidney disease, diverticulosis, spinal fusion at L5-S1  PAIN:  Are you having pain? Yes: NPRS scale: 1/10 Pain location: Low back, left glute, posterior thigh and lower leg Pain description: Ache, throbbing, sharp, numb Aggravating factors: bed transfers, walking, sitting, standing for prolonged periods Relieving factors: hot pack and pain medication  PRECAUTIONS: Fall  RED FLAGS: None   WEIGHT BEARING RESTRICTIONS: No  FALLS:  Has patient fallen in last 6 months? No  LIVING ENVIRONMENT: Lives with:  lives alone Lives in: House/apartment Stairs: Yes: External: 1 back porch steps; none Has following equipment at home: Single point cane and Environmental consultant - 2 wheeled  OCCUPATION: Retired   PLOF: Independent  PATIENT GOALS: Yard work, perform ADL's, driving without pain, decrease pain, improve movement, improve strength, stand longer, sit longer  NEXT MD VISIT: 08/16/23  OBJECTIVE:  Note: Objective measures were completed at Evaluation unless otherwise noted.  DIAGNOSTIC FINDINGS:   MRI on 05/19/2023:   Degenerative changes of the lumbar spine as above. Disc bulge at L2-3 likely abuts the traversing left L3 nerve roots. Additional disc bulge and osteophytes at L3-4 likely abuts the traversing bilateral L4 nerve roots.   Multilevel foraminal stenosis, greatest and mild-to-moderate on the right at L1-2 and on the left at L4-5.   Degenerative endplate changes with associated discogenic edema at T11-12 and L3-4 which may contribute to back pain.   S shaped scoliosis of the thoracolumbar spine as above.  PATIENT SURVEYS:  Modified Oswestry 16/50   COGNITION: Overall cognitive status: WFL     SENSATION: Reflexes:  Left L3-4: 1+ Right L3-4: 1+  Left S1: 0 Right: 2+   Dermatomes:  WFL on the right LE Left L2: 1 diminished  Left L4: 1 diminished  Left L5: 1, diminished  Left S1: 0, absent   POSTURE: rounded shoulders  PALPATION: TTP over erector spinae muscles in lumbar region, L3-L4, L4-L5 facet joints, L3-5 spinous processes.   PASSIVE ACCESSORY MOTION:   Spinous processes: Pain and hypomobile at L2-L5 Left L3-L4 and L4-L5 facet joints: radiating pain and hypomobile  Right L3-L4 and L4-L5: pain and hypomobile  LUMBAR ROM:   AROM eval  Flexion 65 degrees, pain  Extension 12 degrees, reproduced her pain  Right lateral flexion 17, pain   Left lateral flexion 11 degrees, reproduced her pain  Right rotation WFL, reproduced her pain  Left rotation WFL    (Blank rows =  not tested)  LOWER EXTREMITY MMT:    MMT Right eval Right 08/09/23 Left eval Left 08/09/23  Hip flexion 3 4-/5 3 3+/5  Hip extension      Hip abduction      Hip adduction      Hip internal rotation      Hip external rotation      Knee flexion 3 4-/5 3 4-/5  Knee extension 3+ 4/5 3 4/5  Ankle dorsiflexion      Ankle plantarflexion      Ankle inversion      Ankle eversion       (Blank rows = not tested)  LUMBAR SPECIAL TESTS:  Slump test: Positive  GAIT: Distance walked: 82 feet Assistive device  utilized: Single point cane Level of assistance: Modified independence Comments: Patient demonstrated an antalgic step-through gait with reduced speed, stance time and stride length on left leg.   TREATMENT DATE:                                     EXERCISE LOG  Exercise Repetitions and Resistance Comments  Nustep  L4 x 14 minutes   LAQ w/ ball squeeze 3 minutes Alternating LE  Bilateral shoulder ER  Green t-band x 3 minutes  For postural reeducation  Goal assessment See goal section        Blank cell = exercise not performed today                                   07/26/23 EXERCISE LOG  Exercise Repetitions and Resistance Comments  Nustep  L1 x 16 minutes   Seated HS stretch  4 x 30 seconds  LLE only  Seated gastroc stretch  4 x 30 seconds  LLE only   Rocker board (seated)  4.5 minutes   L ankle PF  Green t-band x 2 minutes   Single knee to chest  1 x 15 seconds each     Blank cell = exercise not performed today                                    07/24/23 EXERCISE LOG  Exercise Repetitions and Resistance Comments  Nustep  L3 x 15 minutes   Seated hip ADD isometric  2 minutes w/ 5 second hold   LAQ 20 reps each  Alternating LE  Slouch overcorrect  2 minutes   Rocker board (seated) 5 minutes     Blank cell = exercise not performed today   PATIENT EDUCATION:  Education details: Progress with skilled physical therapy, healing, and objective findings Person educated:  Patient Education method: Explanation Education comprehension: verbalized understanding  HOME EXERCISE PROGRAM: [id number]  ASSESSMENT:  CLINICAL IMPRESSION: Patient has made fair progress with skilled physical therapy as evidenced by her subjective reports, functional mobility, objective measures, and progress toward his goals. She was able to meet her short and long-term pain goals for physical therapy. However, this could be partially attributed to her recent lumbar injection.  She continues to exhibit reduced muscular strength bilaterally and lumbar active range of motion. She reported feeling alright upon the conclusion of treatment. Recommend that she continue with skilled physical therapy to address her remaining impairments to maximize her functional mobility.   OBJECTIVE IMPAIRMENTS: Abnormal gait, decreased activity tolerance, decreased balance, decreased coordination, decreased endurance, decreased mobility, difficulty walking, decreased ROM, decreased strength, hypomobility, impaired flexibility, impaired sensation, and pain.   ACTIVITY LIMITATIONS: carrying, lifting, bending, sitting, standing, squatting, sleeping, stairs, transfers, bed mobility, bathing, dressing, and locomotion level  PARTICIPATION LIMITATIONS: meal prep, cleaning, laundry, driving, shopping, community activity, and yard work  PERSONAL FACTORS: Age, Time since onset of injury/illness/exacerbation, Transportation, and 3+ comorbidities: Osteoporosis, anxiety, hyperlipidemia, hypertension, heart murmur, bilateral TKA's per patient report, S-shaped Scoliosis, complication of anesthesia, chronic kidney disease, spinal fusion at L5-S1 are also affecting patient's functional outcome.   REHAB POTENTIAL: Fair    CLINICAL DECISION MAKING: Unstable/unpredictable  EVALUATION COMPLEXITY: High   GOALS: Goals reviewed with patient? No  SHORT  TERM GOALS: Target date: 08/02/23  Patient will be independent with her HEP.   Baseline: Goal status: MET  2.  Patient's bilateral lateral flexion will increase to at least 25 degrees to enable pain-free reaching for items on the car floor and center console.   Baseline: 26 degrees to the right; 14 degrees to the left Goal status: PARTIALLY MET  3.  Patient's pain at its worst will decrease from 9-10/10 to 7-8/10 to ensure greater confidence in being able to stand for longer periods of time.  Baseline: 2.5/10 (since her injection)  Goal status: MET  4.  Patient's Modified Oswestry Disability Index score will reduce from 16/50 to at least 11/50 to ensure greater confidence in being able to perform ADL's.  Baseline: 24/50 Goal status: ON GOING  LONG TERM GOALS: Target date: 08/23/23  Patient will be independent with advanced HEP.  Baseline:  Goal status: ON GOING  2.  Patient's left knee flexion and extension MMT will increase to at least 4 which will help normalize gait mechanics.  Baseline: See objective Goal status: ON GOING  3. Patient will report her pain with bed mobility will decrease from 7/10 to a 5/10.  Baseline: 2.5/10 (since injection)  Goal status: MET   4.  Patient's left hip flexion MMT will increase to at least 4 to enhance her ability to ascend and descend stairs.  Baseline: See objective Goal status: ON GOING   PLAN:  PT FREQUENCY: 1-2x/week  PT DURATION: 6 weeks  PLANNED INTERVENTIONS: [id number]- PT Re-evaluation, 97750- Physical Performance Testing, 97110-Therapeutic exercises, 97530- Therapeutic activity, [id number]- Neuromuscular re-education, 97535- Self Care, 29528- Manual therapy, [phone]- Gait training, [phone]- Electrical stimulation (unattended), Patient/Family education, Balance training, Stair training, Joint mobilization, Spinal mobilization, Cryotherapy, and Moist heat.  PLAN FOR NEXT SESSION: Nustep, core strengthening, low back iso's, modalities as needed.    Lane Pinon, PT 08/09/2023, 12:22 PM

## 2023-08-15 DIAGNOSIS — H26492 Other secondary cataract, left eye: Secondary | ICD-10-CM | POA: Diagnosis not present

## 2023-08-16 ENCOUNTER — Ambulatory Visit: Attending: Orthopedic Surgery

## 2023-08-16 DIAGNOSIS — M6281 Muscle weakness (generalized): Secondary | ICD-10-CM | POA: Diagnosis not present

## 2023-08-16 DIAGNOSIS — M5417 Radiculopathy, lumbosacral region: Secondary | ICD-10-CM | POA: Insufficient documentation

## 2023-08-16 NOTE — Therapy (Signed)
 OUTPATIENT PHYSICAL THERAPY THORACOLUMBAR TREATMENT   Patient Name: Susan Nunez MRN: [id number] DOB:02-Apr-1940, 83 y.o., female Today's Date: 08/16/2023  END OF SESSION:  PT End of Session - 08/16/23 0935     Visit Number 5    Number of Visits 12    Date for PT Re-Evaluation 08/24/23    Authorization Time Period 07/12/23-09/10/23    Authorization - Number of Visits 10    PT Start Time 0930    PT Stop Time 1028    PT Time Calculation (min) 58 min    Activity Tolerance Patient tolerated treatment well    Behavior During Therapy WFL for tasks assessed/performed                 Past Medical History:  Diagnosis Date   Anxiety    when husband was ill and died   Arthritis    Cancer (HCC)    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 EF at 65% -- mooth and normal coronary arteries -- Normal left ventricular systolic function.  We will continue with medical therapy.  I suspect her chest pain was due to her mitral valve prolapse syndrome --  [place], M.D.    CHOLECYSTECTOMY N/A 01/15/2020   Procedure: LAPAROSCOPIC CHOLECYSTECTOMY WITH INTRAOPERATIVE CHOLANGIOGRAM;  Surgeon: Oralee Billow, MD;  Location: [facility];  Service: General;  Laterality: N/A;   FOOT SURGERY  07/12/00   right   JOINT REPLACEMENT     KNEE ARTHROSCOPY  06/14/2009   LUMBAR SPINE SURGERY     lumbar spine fusion L5-S1 performed 20 years ago x 3   TOTAL KNEE ARTHROPLASTY Right  11/04/2014   Procedure: RIGHT TOTAL KNEE ARTHROPLASTY;  Surgeon: Timothy Ford, MD;  Location: [facility];  Service: Orthopedics;  Laterality: Right;   TOTAL VAGINAL HYSTERECTOMY  12/18/2001   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   Chest pain of uncertain etiology 10/28/2010   Hyperlipidemia 10/28/2010   Mitral valve prolapse 10/28/2010    PCP: Helyn Lobstein, MD   REFERRING PROVIDER: Timothy Ford, MD   REFERRING DIAG: [phone] (ICD-10-CM) - Degeneration of intervertebral disc of lumbar region with lower extremity pain    Rationale for Evaluation and Treatment: Rehabilitation  THERAPY DIAG:  Radiculopathy, lumbosacral region  Muscle weakness (generalized)  ONSET DATE: 04/2023  SUBJECTIVE:  SUBJECTIVE STATEMENT: Patient reports that she feels alright today. She is not hurting very much right now.   PERTINENT HISTORY:    Osteoporosis, anxiety, hyperlipidemia, hypertension, heart murmur, bilateral TKA's per patient report, S-shaped Scoliosis, chronic kidney disease, diverticulosis, spinal fusion at L5-S1  PAIN:  Are you having pain? Yes: NPRS scale: <1/10 Pain location: Low back, left glute, posterior thigh and lower leg Pain description: Ache, throbbing, sharp, numb Aggravating factors: bed transfers, walking, sitting, standing for prolonged periods Relieving factors: hot pack and pain medication  PRECAUTIONS: Fall  RED FLAGS: None   WEIGHT BEARING RESTRICTIONS: No  FALLS:  Has patient fallen in last 6 months? No  LIVING ENVIRONMENT: Lives with: lives alone Lives in: House/apartment Stairs: Yes: External: 1 back porch steps; none Has following equipment at home: Single point  cane and Environmental consultant - 2 wheeled  OCCUPATION: Retired   PLOF: Independent  PATIENT GOALS: Yard work, perform ADL's, driving without pain, decrease pain, improve movement, improve strength, stand longer, sit longer  NEXT MD VISIT: 08/16/23  OBJECTIVE:  Note: Objective measures were completed at Evaluation unless otherwise noted.  DIAGNOSTIC FINDINGS:   MRI on 05/19/2023:   Degenerative changes of the lumbar spine as above. Disc bulge at L2-3 likely abuts the traversing left L3 nerve roots. Additional disc bulge and osteophytes at L3-4 likely abuts the traversing bilateral L4 nerve roots.   Multilevel foraminal stenosis, greatest and mild-to-moderate on the right at L1-2 and on the left at L4-5.   Degenerative endplate changes with associated discogenic edema at T11-12 and L3-4 which may contribute to back pain.   S shaped scoliosis of the thoracolumbar spine as above.  PATIENT SURVEYS:  Modified Oswestry 16/50   COGNITION: Overall cognitive status: WFL     SENSATION: Reflexes:  Left L3-4: 1+ Right L3-4: 1+  Left S1: 0 Right: 2+   Dermatomes:  WFL on the right LE Left L2: 1 diminished  Left L4: 1 diminished  Left L5: 1, diminished  Left S1: 0, absent   POSTURE: rounded shoulders  PALPATION: TTP over erector spinae muscles in lumbar region, L3-L4, L4-L5 facet joints, L3-5 spinous processes.   PASSIVE ACCESSORY MOTION:   Spinous processes: Pain and hypomobile at L2-L5 Left L3-L4 and L4-L5 facet joints: radiating pain and hypomobile  Right L3-L4 and L4-L5: pain and hypomobile  LUMBAR ROM:   AROM eval  Flexion 65 degrees, pain  Extension 12 degrees, reproduced her pain  Right lateral flexion 17, pain   Left lateral flexion 11 degrees, reproduced her pain  Right rotation WFL, reproduced her pain  Left rotation WFL    (Blank rows = not tested)  LOWER EXTREMITY MMT:    MMT Right eval Right 08/09/23 Left eval Left 08/09/23  Hip flexion 3 4-/5 3 3+/5  Hip  extension      Hip abduction      Hip adduction      Hip internal rotation      Hip external rotation      Knee flexion 3 4-/5 3 4-/5  Knee extension 3+ 4/5 3 4/5  Ankle dorsiflexion      Ankle plantarflexion      Ankle inversion      Ankle eversion       (Blank rows = not tested)  LUMBAR SPECIAL TESTS:  Slump test: Positive  GAIT: Distance walked: 82 feet Assistive device utilized: Single point cane Level of assistance: Modified independence Comments: Patient demonstrated an antalgic step-through gait with reduced speed, stance  time and stride length on left leg.   TREATMENT DATE:                                    08/16/23 EXERCISE LOG  Exercise Repetitions and Resistance Comments  Nustep  L4 x 16 minutes   Rocker board  2 minutes   LAQ w/ ball squeeze  2 minutes Alternating LE  Seated hip ADD isometric  3 minutes w/ 5 second hold   Patient education See below    Blank cell = exercise not performed today  Modalities: no redness or adverse reaction to today's modalities  Date:  Unattended Estim: left lumbar paraspinals, pre mod @ 80-150 Hz, 15 mins, Pain and Tone                                   08/09/23 EXERCISE LOG  Exercise Repetitions and Resistance Comments  Nustep  L4 x 14 minutes   LAQ w/ ball squeeze 3 minutes Alternating LE  Bilateral shoulder ER  Green t-band x 3 minutes  For postural reeducation  Goal assessment See goal section        Blank cell = exercise not performed today                                    07/26/23 EXERCISE LOG  Exercise Repetitions and Resistance Comments  Nustep  L1 x 16 minutes   Seated HS stretch  4 x 30 seconds  LLE only  Seated gastroc stretch  4 x 30 seconds  LLE only   Rocker board (seated)  4.5 minutes   L ankle PF  Green t-band x 2 minutes   Single knee to chest  1 x 15 seconds each     Blank cell = exercise not performed today   PATIENT EDUCATION:  Education details: healing, POC, anatomy, TENS, pain science, dry  needling, and prognosis Person educated: Patient Education method: Explanation Education comprehension: verbalized understanding  HOME EXERCISE PROGRAM: [id number]  ASSESSMENT:  CLINICAL IMPRESSION: Patient was progressed with familiar interventions for isometric muscular engagement. She required minimal cueing with today's interventions for proper exercise performance. She was educated on her symptoms and healing. She reported understanding. She reported feeling alright upon the conclusion of treatment. She continues to require skilled physical therapy to address her remaining impairments to maximize her functional mobility.    OBJECTIVE IMPAIRMENTS: Abnormal gait, decreased activity tolerance, decreased balance, decreased coordination, decreased endurance, decreased mobility, difficulty walking, decreased ROM, decreased strength, hypomobility, impaired flexibility, impaired sensation, and pain.   ACTIVITY LIMITATIONS: carrying, lifting, bending, sitting, standing, squatting, sleeping, stairs, transfers, bed mobility, bathing, dressing, and locomotion level  PARTICIPATION LIMITATIONS: meal prep, cleaning, laundry, driving, shopping, community activity, and yard work  PERSONAL FACTORS: Age, Time since onset of injury/illness/exacerbation, Transportation, and 3+ comorbidities: Osteoporosis, anxiety, hyperlipidemia, hypertension, heart murmur, bilateral TKA's per patient report, S-shaped Scoliosis, complication of anesthesia, chronic kidney disease, spinal fusion at L5-S1 are also affecting patient's functional outcome.   REHAB POTENTIAL: Fair    CLINICAL DECISION MAKING: Unstable/unpredictable  EVALUATION COMPLEXITY: High   GOALS: Goals reviewed with patient? No  SHORT TERM GOALS: Target date: 08/02/23  Patient will be independent with her HEP.  Baseline: Goal status: MET  2.  Patient's bilateral  lateral flexion will increase to at least 25 degrees to enable pain-free reaching for  items on the car floor and center console.   Baseline: 26 degrees to the right; 14 degrees to the left Goal status: PARTIALLY MET  3.  Patient's pain at its worst will decrease from 9-10/10 to 7-8/10 to ensure greater confidence in being able to stand for longer periods of time.  Baseline: 2.5/10 (since her injection)  Goal status: MET  4.  Patient's Modified Oswestry Disability Index score will reduce from 16/50 to at least 11/50 to ensure greater confidence in being able to perform ADL's.  Baseline: 24/50 Goal status: ON GOING  LONG TERM GOALS: Target date: 08/23/23  Patient will be independent with advanced HEP.  Baseline:  Goal status: ON GOING  2.  Patient's left knee flexion and extension MMT will increase to at least 4 which will help normalize gait mechanics.  Baseline: See objective Goal status: ON GOING  3. Patient will report her pain with bed mobility will decrease from 7/10 to a 5/10.  Baseline: 2.5/10 (since injection)  Goal status: MET   4.  Patient's left hip flexion MMT will increase to at least 4 to enhance her ability to ascend and descend stairs.  Baseline: See objective Goal status: ON GOING   PLAN:  PT FREQUENCY: 1-2x/week  PT DURATION: 6 weeks  PLANNED INTERVENTIONS: 97164- PT Re-evaluation, 97750- Physical Performance Testing, 97110-Therapeutic exercises, 97530- Therapeutic activity, [id number]- Neuromuscular re-education, 97535- Self Care, 60454- Manual therapy, [phone]- Gait training, [phone]- Electrical stimulation (unattended), Patient/Family education, Balance training, Stair training, Joint mobilization, Spinal mobilization, Cryotherapy, and Moist heat.  PLAN FOR NEXT SESSION: Nustep, core strengthening, low back iso's, modalities as needed.    Lane Pinon, PT 08/16/2023, 1:00 PM

## 2023-08-17 ENCOUNTER — Encounter

## 2023-08-21 ENCOUNTER — Other Ambulatory Visit: Payer: Self-pay | Admitting: Internal Medicine

## 2023-08-21 ENCOUNTER — Other Ambulatory Visit (HOSPITAL_COMMUNITY): Payer: Self-pay | Admitting: Family Medicine

## 2023-08-21 DIAGNOSIS — Z1231 Encounter for screening mammogram for malignant neoplasm of breast: Secondary | ICD-10-CM

## 2023-08-22 DIAGNOSIS — H2512 Age-related nuclear cataract, left eye: Secondary | ICD-10-CM | POA: Diagnosis not present

## 2023-08-22 DIAGNOSIS — Z961 Presence of intraocular lens: Secondary | ICD-10-CM | POA: Diagnosis not present

## 2023-08-27 ENCOUNTER — Ambulatory Visit

## 2023-08-27 DIAGNOSIS — M6281 Muscle weakness (generalized): Secondary | ICD-10-CM

## 2023-08-27 DIAGNOSIS — M5417 Radiculopathy, lumbosacral region: Secondary | ICD-10-CM | POA: Diagnosis not present

## 2023-08-27 NOTE — Therapy (Signed)
 OUTPATIENT PHYSICAL THERAPY THORACOLUMBAR TREATMENT   Patient Name: Susan Nunez MRN: [id number] DOB:23-Sep-1940, 83 y.o., female Today's Date: 08/27/2023  END OF SESSION:  PT End of Session - 08/27/23 0936     Visit Number 6    Number of Visits 12    Date for PT Re-Evaluation 08/24/23    Authorization Time Period 07/12/23-09/10/23    Authorization - Number of Visits 10    PT Start Time 0930    PT Stop Time 1022    PT Time Calculation (min) 52 min    Activity Tolerance Patient tolerated treatment well    Behavior During Therapy WFL for tasks assessed/performed               Past Medical History:  Diagnosis Date   Anxiety    when husband was ill and died   Arthritis    Cancer (HCC)    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 EF at 65% -- mooth and normal coronary arteries -- Normal left ventricular systolic function.  We will continue with medical therapy.  I suspect her chest pain was due to her mitral valve prolapse syndrome --  [place], M.D.    CHOLECYSTECTOMY N/A 01/15/2020   Procedure: LAPAROSCOPIC CHOLECYSTECTOMY WITH INTRAOPERATIVE CHOLANGIOGRAM;  Surgeon: Oralee Billow, MD;  Location: [facility];  Service: General;  Laterality: N/A;   FOOT SURGERY  07/12/00   right   JOINT REPLACEMENT     KNEE ARTHROSCOPY  06/14/2009   LUMBAR SPINE SURGERY     lumbar spine fusion L5-S1 performed 20 years ago x 3   TOTAL KNEE ARTHROPLASTY Right  11/04/2014   Procedure: RIGHT TOTAL KNEE ARTHROPLASTY;  Surgeon: Timothy Ford, MD;  Location: [facility];  Service: Orthopedics;  Laterality: Right;   TOTAL VAGINAL HYSTERECTOMY  12/18/2001   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   Chest pain of uncertain etiology 10/28/2010   Hyperlipidemia 10/28/2010   Mitral valve prolapse 10/28/2010    PCP: Helyn Lobstein, MD   REFERRING PROVIDER: Timothy Ford, MD   REFERRING DIAG: [phone] (ICD-10-CM) - Degeneration of intervertebral disc of lumbar region with lower extremity pain    Rationale for Evaluation and Treatment: Rehabilitation  THERAPY DIAG:  Radiculopathy, lumbosacral region  Muscle weakness (generalized)  ONSET DATE: 04/2023  SUBJECTIVE:  SUBJECTIVE STATEMENT: Patient reports that she feels a lot better. She tried going to yoga last week which seemed to really help. She has noticed that she does not need to use her cane as much.   PERTINENT HISTORY:    Osteoporosis, anxiety, hyperlipidemia, hypertension, heart murmur, bilateral TKA's per patient report, S-shaped Scoliosis, chronic kidney disease, diverticulosis, spinal fusion at L5-S1  PAIN:  Are you having pain? Yes: NPRS scale: <1/10 Pain location: Low back, left glute, posterior thigh and lower leg Pain description: Ache, throbbing, sharp, numb Aggravating factors: bed transfers, walking, sitting, standing for prolonged periods Relieving factors: hot pack and pain medication  PRECAUTIONS: Fall  RED FLAGS: None   WEIGHT BEARING RESTRICTIONS: No  FALLS:  Has patient fallen in last 6 months? No  LIVING ENVIRONMENT: Lives with: lives alone Lives in: House/apartment Stairs:  Yes: External: 1 back porch steps; none Has following equipment at home: Single point cane and Environmental consultant - 2 wheeled  OCCUPATION: Retired   PLOF: Independent  PATIENT GOALS: Yard work, perform ADL's, driving without pain, decrease pain, improve movement, improve strength, stand longer, sit longer  NEXT MD VISIT: 08/16/23  OBJECTIVE:  Note: Objective measures were completed at Evaluation unless otherwise noted.  DIAGNOSTIC FINDINGS:   MRI on 05/19/2023:   Degenerative changes of the lumbar spine as above. Disc bulge at L2-3 likely abuts the traversing left L3 nerve roots. Additional disc bulge and osteophytes at L3-4 likely abuts the traversing bilateral L4 nerve roots.   Multilevel foraminal stenosis, greatest and mild-to-moderate on the right at L1-2 and on the left at L4-5.   Degenerative endplate changes with associated discogenic edema at T11-12 and L3-4 which may contribute to back pain.   S shaped scoliosis of the thoracolumbar spine as above.  PATIENT SURVEYS:  Modified Oswestry 16/50   COGNITION: Overall cognitive status: WFL     SENSATION: Reflexes:  Left L3-4: 1+ Right L3-4: 1+  Left S1: 0 Right: 2+   Dermatomes:  WFL on the right LE Left L2: 1 diminished  Left L4: 1 diminished  Left L5: 1, diminished  Left S1: 0, absent   POSTURE: rounded shoulders  PALPATION: TTP over erector spinae muscles in lumbar region, L3-L4, L4-L5 facet joints, L3-5 spinous processes.   PASSIVE ACCESSORY MOTION:   Spinous processes: Pain and hypomobile at L2-L5 Left L3-L4 and L4-L5 facet joints: radiating pain and hypomobile  Right L3-L4 and L4-L5: pain and hypomobile  LUMBAR ROM:   AROM eval  Flexion 65 degrees, pain  Extension 12 degrees, reproduced her pain  Right lateral flexion 17, pain   Left lateral flexion 11 degrees, reproduced her pain  Right rotation WFL, reproduced her pain  Left rotation WFL    (Blank rows = not tested)  LOWER EXTREMITY MMT:    MMT  Right eval Right 08/09/23 Right 08/27/23 Left eval Left 08/09/23 Left 08/27/23  Hip flexion 3 4-/5 3+/5 3 3+/5 3+/5  Hip extension        Hip abduction        Hip adduction        Hip internal rotation        Hip external rotation        Knee flexion 3 4-/5 4/5 3 4-/5 4/5  Knee extension 3+ 4/5 4+/5 3 4/5 4+/5  Ankle dorsiflexion        Ankle plantarflexion        Ankle inversion        Ankle eversion         (  Blank rows = not tested)  LUMBAR SPECIAL TESTS:  Slump test: Positive  GAIT: Distance walked: 82 feet Assistive device utilized: Single point cane Level of assistance: Modified independence Comments: Patient demonstrated an antalgic step-through gait with reduced speed, stance time and stride length on left leg.   TREATMENT DATE:                                    08/27/23 EXERCISE LOG  Exercise Repetitions and Resistance Comments  Nustep  L3 x 15 minutes   Goal assessment See goal section   Rocker board  2 minutes     Blank cell = exercise not performed today  Modalities: no redness or adverse reaction to today's modalities  Date:  Unattended Estim: Lumbar, IFC @ 80-150 Hz w/ 40% scan, 15 mins, Pain and Tone                                   08/16/23 EXERCISE LOG  Exercise Repetitions and Resistance Comments  Nustep  L4 x 16 minutes   Rocker board  2 minutes   LAQ w/ ball squeeze  2 minutes Alternating LE  Seated hip ADD isometric  3 minutes w/ 5 second hold   Patient education See below    Blank cell = exercise not performed today  Modalities: no redness or adverse reaction to today's modalities  Date:  Unattended Estim: left lumbar paraspinals, pre mod @ 80-150 Hz, 15 mins, Pain and Tone                                   08/09/23 EXERCISE LOG  Exercise Repetitions and Resistance Comments  Nustep  L4 x 14 minutes   LAQ w/ ball squeeze 3 minutes Alternating LE  Bilateral shoulder ER  Green t-band x 3 minutes  For postural reeducation  Goal assessment See  goal section        Blank cell = exercise not performed today   PATIENT EDUCATION:  Education details: healing, POC, anatomy, TENS, pain science, dry needling, and prognosis Person educated: Patient Education method: Explanation Education comprehension: verbalized understanding  HOME EXERCISE PROGRAM: [id number]  ASSESSMENT:  CLINICAL IMPRESSION: Patient has made good progress with skilled physical therapy as evidenced by her subjective reports, objective measures, functional mobility, and progress toward her goals. She was able to demonstrate a significant improvement in her lower extremity strength bilaterally. She was able to meet most of her goals for skilled physical therapy and she felt comfortable being discharged at this time as she plans to participate in regular yoga classes at the gym.   PHYSICAL THERAPY DISCHARGE SUMMARY  Visits from Start of Care: 6  Current functional level related to goals / functional outcomes: Patient was able to meet most of her goals for skilled physical therapy.    Remaining deficits: Muscular strength and endurance with standing activities   Education / Equipment: HEP    Patient agrees to discharge. Patient goals were partially met. Patient is being discharged due to being pleased with the current functional level.    OBJECTIVE IMPAIRMENTS: Abnormal gait, decreased activity tolerance, decreased balance, decreased coordination, decreased endurance, decreased mobility, difficulty walking, decreased ROM, decreased strength, hypomobility, impaired flexibility, impaired sensation, and pain.   ACTIVITY LIMITATIONS: carrying, lifting,  bending, sitting, standing, squatting, sleeping, stairs, transfers, bed mobility, bathing, dressing, and locomotion level  PARTICIPATION LIMITATIONS: meal prep, cleaning, laundry, driving, shopping, community activity, and yard work  PERSONAL FACTORS: Age, Time since onset of injury/illness/exacerbation,  Transportation, and 3+ comorbidities: Osteoporosis, anxiety, hyperlipidemia, hypertension, heart murmur, bilateral TKA's per patient report, S-shaped Scoliosis, complication of anesthesia, chronic kidney disease, spinal fusion at L5-S1 are also affecting patient's functional outcome.   REHAB POTENTIAL: Fair    CLINICAL DECISION MAKING: Unstable/unpredictable  EVALUATION COMPLEXITY: High   GOALS: Goals reviewed with patient? No  SHORT TERM GOALS: Target date: 08/02/23  Patient will be independent with her HEP.  Baseline: Goal status: MET  2.  Patient's bilateral lateral flexion will increase to at least 25 degrees to enable pain-free reaching for items on the car floor and center console.   Baseline: 30 degrees to the right; 36 degrees to the left Goal status: MET  3.  Patient's pain at its worst will decrease from 9-10/10 to 7-8/10 to ensure greater confidence in being able to stand for longer periods of time.  Baseline: 2.5/10 (since her injection)  Goal status: MET  4.  Patient's Modified Oswestry Disability Index score will reduce from 16/50 to at least 11/50 to ensure greater confidence in being able to perform ADL's.  Baseline: 18/50 on 08/27/23 Goal status: ON GOING  LONG TERM GOALS: Target date: 08/23/23  Patient will be independent with advanced HEP.  Baseline:  Goal status: MET  2.  Patient's left knee flexion and extension MMT will increase to at least 4 which will help normalize gait mechanics.  Baseline: See objective Goal status: MET  3. Patient will report her pain with bed mobility will decrease from 7/10 to a 5/10.  Baseline: 2.5/10 (since injection)  Goal status: MET   4.  Patient's left hip flexion MMT will increase to at least 4 to enhance her ability to ascend and descend stairs.  Baseline: See objective Goal status: ON GOING   PLAN:  PT FREQUENCY: 1-2x/week  PT DURATION: 6 weeks  PLANNED INTERVENTIONS: 97164- PT Re-evaluation, 97750- Physical  Performance Testing, 97110-Therapeutic exercises, 97530- Therapeutic activity, [id number]- Neuromuscular re-education, 97535- Self Care, 16109- Manual therapy, [phone]- Gait training, [phone]- Electrical stimulation (unattended), Patient/Family education, Balance training, Stair training, Joint mobilization, Spinal mobilization, Cryotherapy, and Moist heat.  PLAN FOR NEXT SESSION: Nustep, core strengthening, low back iso's, modalities as needed.    Lane Pinon, PT 08/27/2023, 12:48 PM

## 2023-08-28 ENCOUNTER — Encounter: Payer: Self-pay | Admitting: Internal Medicine

## 2023-08-28 ENCOUNTER — Ambulatory Visit: Admitting: Internal Medicine

## 2023-08-28 VITALS — BP 110/74 | HR 86 | Ht 69.0 in | Wt 163.3 lb

## 2023-08-28 DIAGNOSIS — H6121 Impacted cerumen, right ear: Secondary | ICD-10-CM | POA: Diagnosis not present

## 2023-08-28 DIAGNOSIS — K219 Gastro-esophageal reflux disease without esophagitis: Secondary | ICD-10-CM | POA: Diagnosis not present

## 2023-08-28 DIAGNOSIS — J4540 Moderate persistent asthma, uncomplicated: Secondary | ICD-10-CM | POA: Diagnosis not present

## 2023-08-28 DIAGNOSIS — J302 Other seasonal allergic rhinitis: Secondary | ICD-10-CM

## 2023-08-28 MED ORDER — FLUTICASONE-SALMETEROL 230-21 MCG/ACT IN AERO: 2.0000 | INHALATION_SPRAY | Freq: Two times a day (BID) | RESPIRATORY_TRACT | 12 refills | Status: DC

## 2023-08-28 NOTE — Patient Instructions (Signed)
 It was a pleasure to see you today!  Please schedule follow up with myself in 6 months.  If my schedule is not open yet, we will contact you with a reminder closer to that time. Please call [phone] if you haven't heard from us  a month before, and always call us  sooner if issues or concerns arise. You can also send us  a message through MyChart, but but aware that this is not to be used for urgent issues and it may take up to 5-7 days to receive a reply. Please be aware that you will likely be able to view your results before I have a chance to respond to them. Please give us  5 business days to respond to any non-urgent results.   Glad asthma is doing well Continue advair with spacer with albuterol  as needed.  Continue claritin alternating with zyrtec  for allerties  Resume flonase  nasal spray to help with ear pain and nasal drainage  Continue prilosec for reflux  Right ear is impacted with ear wax. Recommend over the counter ear cleaning kit and follow up with ENT or primary care if no improvement.

## 2023-08-28 NOTE — Progress Notes (Signed)
 Susan Nunez    [id number]    10-07-1940  Primary Care Physician:McNeill, Jenette Mitchell, MD Date of Appointment: 08/28/2023 Established Patient Visit  Chief complaint:   Chief Complaint  Patient presents with   Follow-up    Patient is doing well today     HPI: SABRENA GAVITT is a 83 y.o. woman with moderate persistent asthma and GERD with seasonal allergies.   Interval Updates: Here for asthma follow up.  Asthma is doing well. Lately has had trouble with sciatica.   Had the flu and was managed as an outpatient.  Asthma did not flare when she had the flu but the coughing flared back pain.   Tried prednisone  for sciatica but none for respiratory issues.   Minimal albuterol  use.   Has some nasal drainage and having ear pain and pressure.   Current Regimen: advair with spacer, prn albuterol  Asthma Triggers: seasonal allergies, changes in temperature, smoke, strong smells, humidty.  Exacerbations in the last year: once requiring prednisone  History of hospitalization or intubation: never Allergy Testing: never had.  GERD: prilosec Allergic Rhinitis: claritin and zyrtec  as needed.  ACT:  Asthma Control Test ACT Total Score  08/28/2023  1:11 PM 21  10/03/2022  8:58 AM 21   FeNO: 41 ppb --->32 ppb  I have reviewed the patient's family social and past medical history and updated as appropriate.   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 EF at 65% -- mooth and normal coronary arteries -- Normal left ventricular systolic function.  We will continue with medical therapy.  I suspect her chest pain was due to her mitral valve prolapse syndrome --  [place], M.D.    CHOLECYSTECTOMY N/A 01/15/2020   Procedure: LAPAROSCOPIC CHOLECYSTECTOMY WITH INTRAOPERATIVE CHOLANGIOGRAM;  Surgeon: Oralee Billow, MD;  Location: [facility];  Service: General;  Laterality: N/A;   FOOT SURGERY  07/12/00   right   JOINT REPLACEMENT     KNEE ARTHROSCOPY  06/14/2009   LUMBAR SPINE SURGERY     lumbar spine fusion L5-S1 performed 20 years ago x 3   TOTAL KNEE ARTHROPLASTY Right 11/04/2014   Procedure: RIGHT TOTAL KNEE ARTHROPLASTY;  Surgeon: Timothy Ford, MD;  Location: [facility];  Service: Orthopedics;  Laterality: Right;   TOTAL VAGINAL HYSTERECTOMY  12/18/2001    Family History  Problem Relation Age of Onset   Hypertension Mother    Coronary artery disease Father    Heart attack Father    Asthma Grandson    Allergic Disorder Grandson     Social History   Occupational History   Not on file  Tobacco Use  Smoking status: Never   Smokeless tobacco: Never  Vaping Use   Vaping status: Never Used  Substance and Sexual Activity   Alcohol use: No   Drug use: No   Sexual activity: Not on file     Physical Exam: Blood pressure 110/74, pulse 86, height 5' 9 (1.753 m), weight 163 lb 4.8 oz (74.1 kg), SpO2 98%.  Gen:      No acute distress ENT: right ear canal impacted cerumen, left ear canal patent, normal ear drum Lungs:  ctab no wheeze CV:        RRR no mrg   Data Reviewed: Imaging: I have personally reviewed the chest xray April 2024 shows hyperinflation  PFTs:      No data to display          Labs: Lab Results  Component Value Date   NA 139 05/08/2022   K 4.6 05/08/2022   CO2 22 05/08/2022   GLUCOSE 108  (H) 05/08/2022   BUN 35 (H) 05/08/2022   CREATININE 1.10 (H) 05/08/2022   CALCIUM 9.9 05/08/2022   GFRNONAA 51 (L) 05/08/2022   Lab Results  Component Value Date   WBC 5.4 05/08/2022   HGB 11.7 (L) 05/08/2022   HCT 36.3 05/08/2022   MCV 100.3 (H) 05/08/2022   PLT 273 05/08/2022      Immunization status: Immunization History  Administered Date(s) Administered   Influenza, High Dose Seasonal PF 12/14/2017, 11/02/2018   PNEUMOCOCCAL CONJUGATE-20 08/24/2022   Zoster Recombinant(Shingrix) 11/16/2018    External Records Personally Reviewed:   Assessment:  Moderate persistent asthma, controlled GERD -  controlled Allergic rhinitis, controlled  Plan/Recommendations: Glad asthma is doing well Continue advair with spacer with albuterol  as needed.  Continue claritin alternating with zyrtec  for allerties  Resume flonase  nasal spray to help with ear pain and nasal drainage  Continue prilosec for reflux  Right ear is impacted with ear wax. Recommend over the counter ear cleaning kit and follow up with ENT or primary care if no improvement.   Return to Care: Return in about 6 months (around 02/27/2024).   Louie Rover, MD Pulmonary and Critical Care Medicine [facility] Office:[phone]

## 2023-08-29 ENCOUNTER — Encounter: Payer: Self-pay | Admitting: Physical Medicine and Rehabilitation

## 2023-08-31 DIAGNOSIS — M810 Age-related osteoporosis without current pathological fracture: Secondary | ICD-10-CM | POA: Diagnosis not present

## 2023-08-31 DIAGNOSIS — E782 Mixed hyperlipidemia: Secondary | ICD-10-CM | POA: Diagnosis not present

## 2023-08-31 DIAGNOSIS — I10 Essential (primary) hypertension: Secondary | ICD-10-CM | POA: Diagnosis not present

## 2023-08-31 DIAGNOSIS — N1831 Chronic kidney disease, stage 3a: Secondary | ICD-10-CM | POA: Diagnosis not present

## 2023-09-06 DIAGNOSIS — M5441 Lumbago with sciatica, right side: Secondary | ICD-10-CM | POA: Diagnosis not present

## 2023-09-06 DIAGNOSIS — E782 Mixed hyperlipidemia: Secondary | ICD-10-CM | POA: Diagnosis not present

## 2023-09-06 DIAGNOSIS — J4540 Moderate persistent asthma, uncomplicated: Secondary | ICD-10-CM | POA: Diagnosis not present

## 2023-09-06 DIAGNOSIS — Z0000 Encounter for general adult medical examination without abnormal findings: Secondary | ICD-10-CM | POA: Diagnosis not present

## 2023-09-06 DIAGNOSIS — M810 Age-related osteoporosis without current pathological fracture: Secondary | ICD-10-CM | POA: Diagnosis not present

## 2023-09-06 DIAGNOSIS — N1832 Chronic kidney disease, stage 3b: Secondary | ICD-10-CM | POA: Diagnosis not present

## 2023-09-06 DIAGNOSIS — H6123 Impacted cerumen, bilateral: Secondary | ICD-10-CM | POA: Diagnosis not present

## 2023-09-06 DIAGNOSIS — G8929 Other chronic pain: Secondary | ICD-10-CM | POA: Diagnosis not present

## 2023-09-06 DIAGNOSIS — M5442 Lumbago with sciatica, left side: Secondary | ICD-10-CM | POA: Diagnosis not present

## 2023-09-06 DIAGNOSIS — I10 Essential (primary) hypertension: Secondary | ICD-10-CM | POA: Diagnosis not present

## 2023-09-19 ENCOUNTER — Ambulatory Visit (HOSPITAL_COMMUNITY)

## 2023-09-25 ENCOUNTER — Encounter: Payer: Self-pay | Admitting: Physical Medicine and Rehabilitation

## 2023-09-27 ENCOUNTER — Ambulatory Visit (HOSPITAL_COMMUNITY)
Admission: RE | Admit: 2023-09-27 | Discharge: 2023-09-27 | Disposition: A | Discharge status: Home / Self Care | Source: Ambulatory Visit | Attending: Family Medicine | Admitting: Family Medicine

## 2023-09-27 ENCOUNTER — Encounter (HOSPITAL_COMMUNITY): Payer: Self-pay

## 2023-09-27 DIAGNOSIS — Z1231 Encounter for screening mammogram for malignant neoplasm of breast: Secondary | ICD-10-CM | POA: Diagnosis not present

## 2023-10-01 ENCOUNTER — Telehealth: Payer: Self-pay

## 2023-10-01 ENCOUNTER — Encounter: Payer: Self-pay | Admitting: Physical Medicine and Rehabilitation

## 2023-10-01 NOTE — Telephone Encounter (Signed)
 LMX1 for patient to call

## 2023-10-10 ENCOUNTER — Ambulatory Visit (INDEPENDENT_AMBULATORY_CARE_PROVIDER_SITE_OTHER): Admitting: Physical Medicine and Rehabilitation

## 2023-10-10 ENCOUNTER — Encounter: Payer: Self-pay | Admitting: Physical Medicine and Rehabilitation

## 2023-10-10 DIAGNOSIS — R269 Unspecified abnormalities of gait and mobility: Secondary | ICD-10-CM

## 2023-10-10 DIAGNOSIS — M5116 Intervertebral disc disorders with radiculopathy, lumbar region: Secondary | ICD-10-CM | POA: Diagnosis not present

## 2023-10-10 DIAGNOSIS — M961 Postlaminectomy syndrome, not elsewhere classified: Secondary | ICD-10-CM | POA: Diagnosis not present

## 2023-10-10 DIAGNOSIS — M5416 Radiculopathy, lumbar region: Secondary | ICD-10-CM

## 2023-10-10 MED ORDER — TRIAZOLAM 0.25 MG PO TABS
ORAL_TABLET | ORAL | 0 refills | Status: DC
Start: 2023-10-10 — End: 2024-01-09

## 2023-10-10 NOTE — Progress Notes (Unsigned)
 Pain Scale   Average Pain 3 Patient advising she has chronic lower back pain radiating to right hip and leg area, which is constant with minimal relief. Patient advising when she get up in am her pain is worse and she is unable to function until she applies heat and then she is able to move around.        +Driver, -BT, -Dye Allergies.

## 2023-10-10 NOTE — Progress Notes (Unsigned)
 Susan Nunez - 83 y.o. female MRN [id number]  Date of birth: 08/11/1940  Office Visit Note: Visit Date: 10/10/2023 PCP: Aisha Harvey, MD Referred by: Aisha Harvey, MD  Subjective: Chief Complaint  Patient presents with   Lower Back - Pain   HPI: Susan Nunez is a 83 y.o. female who comes in today for evaluation of chronic, worsening and severe left sided buttock pain radiating down lateral hip and lateral leg. Pain ongoing for several months, worsens with standing and walking. Also reports sudden movements cause increased discomfort. Her daughter is accompanying her during our visit today. She describes pain as dull and burning sensation, currently rates as 3 out of 10. Some relief of pain with home exercise regimen, rest and use of medications. History of formal physical therapy with good relief of pain. Lumbar MRI imaging from March of this year shows multi level degenerative changes and facet arthropathy,  S shaped scoliosis of the thoracolumbar spine, there is prominent central disc protrusion at L4-L5 indenting the ventral thecal sac. She does have history of lumbar fusion done with bone grafts many years ago. She underwent left L4-L5 interlaminar epidural steroid injection on 08/02/2023, she reports greater than 80% relief of pain for 2 months with this procedure. She also reports increased functional ability post injection. Patient denies focal weakness. No recent trauma or falls. She is currently using cane to assist with ambulation.     Review of Systems  Musculoskeletal:  Positive for back pain.  Neurological:  Negative for tingling, sensory change, focal weakness and weakness.  All other systems reviewed and are negative.  Otherwise per HPI.  Assessment & Plan: Visit Diagnoses:    ICD-10-CM   1. Lumbar radiculopathy  M54.16     2. 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 Post laminectomy syndrome  M96.1     4. Gait abnormality  R26.9         Plan: Findings:  Chronic, worsening and severe left sided buttock pain radiating down lateral hip and lateral leg. Patient continues to have severe pain despite good conservative therapies such as formal physical therapy, home exercise regimen, rest and use of medications. Patients clinical presentation and exam are consistent with lumbar radiculopathy, more of L5 nerve pattern. There is prominent central disc protrusion at L4-L5 indenting the ventral thecal sac. We discussed treatment plan in detail today, next step is to perform diagnostic and hopefully therapeutic left L4-L5 interlaminar epidural steroid injection under fluoroscopic guidance. Patient does have severe anxiety associated with injection procedures. I prescribed pre-procedure Halcion  for her to take on day of injection. I discussed injection procedure in detail today, she has no questions at this time. We also discussed medication management, she can continue with Naproxen and Tylenol  as needed. I also briefly discussed possibility of surgical referral to discuss treatment options. I do think disc protrusion will re-absorb over time, however would be a good idea to have her speak with Dr. Georgina so that she better understands her options. No red flag symptoms noted upon exam today.     Meds & Orders: No orders of the defined types were placed in this encounter.  No orders of the defined types were placed in this encounter.   Follow-up: Return for Left L4-L5 interlaminar epidural steroid injection.   Procedures: No procedures performed      Clinical History: MRI LUMBAR SPINE WITHOUT CONTRAST   TECHNIQUE: Multiplanar, multisequence MR imaging of the lumbar  spine was performed. No intravenous contrast was administered.   COMPARISON:  Lumbar spine radiograph 05/03/2023   FINDINGS: Segmentation:  Standard.   Alignment: Straightening of the normal lumbar lordosis. Trace retrolisthesis of L3 on L4. S shaped scoliosis of  the thoracolumbar spine. There is leftward convex curvature centered at T12-L1 and rightward convex curvature centered at L3-4.   Vertebrae: Modic type 1 degenerative endplate changes with associated discogenic edema at T11-12 more pronounced on the right. Additional degenerative endplate changes and discogenic edema at L3-4. No evidence of fracture. Heterogeneous bone marrow signal intensity with multiple areas of fatty marrow versus hemangiomas. No suspicious osseous lesion appreciated.   Conus medullaris and cauda equina: Conus extends to the mid/lower L2 level. Conus and cauda equina appear normal.   Paraspinal and other soft tissues: Mild atrophy of the paraspinal musculature. Multiple parapelvic cysts in the left kidney are similar to prior. Mild prominence of the left renal pelvis. Scattered diverticuli along the partially visualized descending colon.   Disc levels:   T12-L1: Diffuse disc bulge and superimposed left subarticular disc protrusion which indents the ventral thecal sac. Bilateral facet arthrosis. Mild spinal canal stenosis. No significant foraminal stenosis.   L1-2: Diffuse disc bulge slightly eccentric to the right which indents the ventral thecal sac without significant spinal canal stenosis. Moderate bilateral facet arthrosis. Mild-to-moderate foraminal stenosis on the right secondary to prominent facet osteophytes.   L2-3: Mild disc height loss. Diffuse disc bulge and posterior osteophytic ridging which indents the ventral thecal sac resulting in narrowing of the lateral recesses. Disc bulge likely abuts the traversing left L3 nerve roots. Moderate facet arthrosis. Mild bilateral foraminal stenosis.   L3-4: Severe disc height loss. Disc bulge and posterior osteophytic ridging which indents the ventral thecal sac resulting in narrowing of the lateral recesses. Disc bulge and osteophytes possibly contacts the traversing bilateral L4 nerve roots. Moderate  facet arthrosis. Mild spinal canal stenosis. Mild bilateral foraminal stenosis.   L4-5: Diffuse disc bulge and prominent central disc protrusion which indents the ventral thecal sac. Moderate facet arthrosis. Mild spinal canal stenosis. There is mild narrowing of the left lateral recess without mass effect on the traversing nerve roots. Mild-to-moderate left foraminal stenosis.   L5-S1: Severe disc space narrowing with partial fusion of the vertebral bodies. No significant spinal canal or foraminal stenosis.   IMPRESSION: Degenerative changes of the lumbar spine as above. Disc bulge at L2-3 likely abuts the traversing left L3 nerve roots. Additional disc bulge and osteophytes at L3-4 likely abuts the traversing bilateral L4 nerve roots.   Multilevel foraminal stenosis, greatest and mild-to-moderate on the right at L1-2 and on the left at L4-5.   Degenerative endplate changes with associated discogenic edema at T11-12 and L3-4 which may contribute to back pain.   S shaped scoliosis of the thoracolumbar spine as above.     Electronically Signed   By: Donnice Mania M.D.   On: 05/30/2023 13:09   She reports that she has never smoked. She has never used smokeless tobacco. No results for input(s): HGBA1C, LABURIC in the last 8760 hours.  Objective:  VS:  HT:    WT:   BMI:     BP:   HR: bpm  TEMP: ( )  RESP:  Physical Exam Vitals and nursing note reviewed.  HENT:     Head: Normocephalic and atraumatic.     Right Ear: External ear normal.     Left Ear: External ear normal.     Nose:  Nose normal.     Mouth/Throat:     Mouth: Mucous membranes are moist.  Eyes:     Extraocular Movements: Extraocular movements intact.  Cardiovascular:     Rate and Rhythm: Normal rate.     Pulses: Normal pulses.  Pulmonary:     Effort: Pulmonary effort is normal.  Abdominal:     General: Abdomen is flat. There is no distension.  Musculoskeletal:        General: Tenderness present.      Cervical back: Normal range of motion.     Comments: Patient is slow to rise from seated position to standing. Good lumbar range of motion. No pain noted with facet loading. 5/5 strength noted with bilateral hip flexion, knee flexion/extension, ankle dorsiflexion/plantarflexion and EHL. No clonus noted bilaterally. No pain upon palpation of greater trochanters. No pain with internal/external rotation of bilateral hips. Sensation intact bilaterally. Dysesthesias noted to left L5 dermatome. Negative slump test bilaterally. Ambulates without aid, gait steady.     Skin:    General: Skin is warm and dry.     Capillary Refill: Capillary refill takes less than 2 seconds.  Neurological:     Mental Status: She is alert and oriented to person, place, and time.     Gait: Gait abnormal.  Psychiatric:        Mood and Affect: Mood normal.        Behavior: Behavior normal.     Ortho Exam  Imaging: No results found.  Past Medical/Family/Surgical/Social History: Medications & Allergies reviewed per EMR, new medications updated. 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   Chronic kidney disease    stage 3....renal insufficiency   Complication of anesthesia    difficulty waking up, difficulty peeing, extreme nausea & vomiting   Diverticulosis    GERD (gastroesophageal reflux disease)    not on daily basis   Heart murmur    Hyperlipidemia    Hypertension    Mitral valve prolapse    with mitral regurgitation    PONV (postoperative nausea and vomiting)    Family History  Problem Relation Age of Onset   Hypertension Mother     Coronary artery disease Father    Heart attack Father    Asthma Grandson    Allergic Disorder Grandson    Past Surgical History:  Procedure Laterality Date   ABDOMINAL HYSTERECTOMY     BACK SURGERY     x 3   CARDIAC CATHETERIZATION  02/21/2010   Est. EF at 65% -- mooth and normal coronary arteries -- Normal left ventricular systolic function.  We will continue with medical therapy.  I suspect her chest pain was due to her mitral valve prolapse syndrome --  Aleene DOROTHA Passe, M.D.    CHOLECYSTECTOMY N/A 01/15/2020   Procedure: LAPAROSCOPIC CHOLECYSTECTOMY WITH INTRAOPERATIVE CHOLANGIOGRAM;  Surgeon: Eletha Boas, MD;  Location: [facility];  Service: General;  Laterality: N/A;   FOOT SURGERY  07/12/00   right   JOINT REPLACEMENT     KNEE ARTHROSCOPY  06/14/2009   LUMBAR SPINE SURGERY  lumbar spine fusion L5-S1 performed 20 years ago x 3   TOTAL KNEE ARTHROPLASTY Right 11/04/2014   Procedure: RIGHT TOTAL KNEE ARTHROPLASTY;  Surgeon: Jerona Harden GAILS, MD;  Location: [facility];  Service: Orthopedics;  Laterality: Right;   TOTAL VAGINAL HYSTERECTOMY  12/18/2001   Social History   Occupational History   Not on file  Tobacco Use   Smoking status: Never   Smokeless tobacco: Never  Vaping Use   Vaping status: Never Used  Substance and Sexual Activity   Alcohol use: No   Drug use: No   Sexual activity: Not on file

## 2023-10-16 ENCOUNTER — Telehealth: Payer: Self-pay | Admitting: Physical Medicine and Rehabilitation

## 2023-10-16 NOTE — Telephone Encounter (Signed)
 Patient would like to be on a wait list for earlier date

## 2023-10-24 DIAGNOSIS — H18593 Other hereditary corneal dystrophies, bilateral: Secondary | ICD-10-CM | POA: Diagnosis not present

## 2023-10-24 DIAGNOSIS — Z961 Presence of intraocular lens: Secondary | ICD-10-CM | POA: Diagnosis not present

## 2023-10-24 DIAGNOSIS — H11823 Conjunctivochalasis, bilateral: Secondary | ICD-10-CM | POA: Diagnosis not present

## 2023-10-24 DIAGNOSIS — H401131 Primary open-angle glaucoma, bilateral, mild stage: Secondary | ICD-10-CM | POA: Diagnosis not present

## 2023-11-06 DIAGNOSIS — Z961 Presence of intraocular lens: Secondary | ICD-10-CM | POA: Diagnosis not present

## 2023-11-06 DIAGNOSIS — H401132 Primary open-angle glaucoma, bilateral, moderate stage: Secondary | ICD-10-CM | POA: Diagnosis not present

## 2023-11-06 DIAGNOSIS — H35373 Puckering of macula, bilateral: Secondary | ICD-10-CM | POA: Diagnosis not present

## 2023-11-06 DIAGNOSIS — H353112 Nonexudative age-related macular degeneration, right eye, intermediate dry stage: Secondary | ICD-10-CM | POA: Diagnosis not present

## 2023-11-06 DIAGNOSIS — H43812 Vitreous degeneration, left eye: Secondary | ICD-10-CM | POA: Diagnosis not present

## 2023-11-06 DIAGNOSIS — H35362 Drusen (degenerative) of macula, left eye: Secondary | ICD-10-CM | POA: Diagnosis not present

## 2023-11-08 ENCOUNTER — Other Ambulatory Visit: Payer: Self-pay

## 2023-11-08 ENCOUNTER — Ambulatory Visit: Admitting: Physical Medicine and Rehabilitation

## 2023-11-08 VITALS — BP 106/72 | HR 81

## 2023-11-08 DIAGNOSIS — M5416 Radiculopathy, lumbar region: Secondary | ICD-10-CM | POA: Diagnosis not present

## 2023-11-08 MED ORDER — METHYLPREDNISOLONE ACETATE 40 MG/ML IJ SUSP
40.0000 mg | Freq: Once | INTRAMUSCULAR | Status: AC
Start: 2023-11-08 — End: 2023-11-08
  Administered 2023-11-08: 40 mg

## 2023-11-08 NOTE — Progress Notes (Signed)
 Pain Scale   Average Pain 7 Patient advising she has chronic lower back pain radiating to left leg.        +Driver, -BT, -Dye Allergies.

## 2023-11-08 NOTE — Progress Notes (Signed)
 Susan Nunez - 83 y.o. female MRN [id number]  Date of birth: 1940/06/28  Office Visit Note: Visit Date: 11/08/2023 PCP: Aisha Harvey, MD Referred by: Aisha Harvey, MD  Subjective: Chief Complaint  Patient presents with   Lower Back - Pain   HPI:  Susan Nunez is a 83 y.o. female who comes in today at the request of Duwaine Pouch, FNP for planned Left L4-5 Lumbar Interlaminar epidural steroid injection with fluoroscopic guidance.  The patient has failed conservative care including home exercise, medications, time and activity modification.  This injection will be diagnostic and hopefully therapeutic.  Please see requesting physician notes for further details and justification. Triazolam  for pre-procedure sedation    ROS Otherwise per HPI.  Assessment & Plan: Visit Diagnoses:    ICD-10-CM   1. Lumbar radiculopathy  M54.16 XR C-ARM NO REPORT    Epidural Steroid injection    methylPREDNISolone  acetate (DEPO-MEDROL ) injection 40 mg      Plan: No additional findings.   Meds & Orders:  Meds ordered this encounter  Medications   methylPREDNISolone  acetate (DEPO-MEDROL ) injection 40 mg    Orders Placed This Encounter  Procedures   XR C-ARM NO REPORT   Epidural Steroid injection    Follow-up: Return for visit to requesting provider as needed.   Procedures: No procedures performed  Lumbar Epidural Steroid Injection - Interlaminar Approach with Fluoroscopic Guidance  Patient: Susan Nunez      Date of Birth: Aug 26, 1940 MRN: [id number] PCP: Aisha Harvey, MD      Visit Date: 11/08/2023   Universal Protocol:     Consent Given By: the patient  Position: PRONE  Additional Comments: Vital signs were monitored before and after the procedure. Patient was prepped and draped in the usual sterile fashion. The correct patient, procedure, and site was verified.   Injection Procedure Details:   Procedure diagnoses: Lumbar radiculopathy [M54.16]   Meds  Administered:  Meds ordered this encounter  Medications   methylPREDNISolone  acetate (DEPO-MEDROL ) injection 40 mg     Laterality: Left  Location/Site:  L4-5  Needle: 3.5 in., 20 ga. Tuohy  Needle Placement: Paramedian epidural  Findings:   -Comments: Excellent flow of contrast into the epidural space.  Procedure Details: Using a paramedian approach from the side mentioned above, the region overlying the inferior lamina was localized under fluoroscopic visualization and the soft tissues overlying this structure were infiltrated with 4 ml. of 1% Lidocaine  without Epinephrine . The Tuohy needle was inserted into the epidural space using a paramedian approach.   The epidural space was localized using loss of resistance along with counter oblique bi-planar fluoroscopic views.  After negative aspirate for air, blood, and CSF, a 2 ml. volume of Isovue-250 was injected into the epidural space and the flow of contrast was observed. Radiographs were obtained for documentation purposes.    The injectate was administered into the level noted above.   Additional Comments:  The patient tolerated the procedure well Dressing: 2 x 2 sterile gauze and Band-Aid    Post-procedure details: Patient was observed during the procedure. Post-procedure instructions were reviewed.  Patient left the clinic in stable condition.   Clinical History: MRI LUMBAR SPINE WITHOUT CONTRAST   TECHNIQUE: Multiplanar, multisequence MR imaging of the lumbar spine was performed. No intravenous contrast was administered.   COMPARISON:  Lumbar spine radiograph 05/03/2023   FINDINGS: Segmentation:  Standard.   Alignment: Straightening of the normal lumbar lordosis. Trace retrolisthesis of L3 on L4. S shaped scoliosis of  the thoracolumbar spine. There is leftward convex curvature centered at T12-L1 and rightward convex curvature centered at L3-4.   Vertebrae: Modic type 1 degenerative endplate changes  with associated discogenic edema at T11-12 more pronounced on the right. Additional degenerative endplate changes and discogenic edema at L3-4. No evidence of fracture. Heterogeneous bone marrow signal intensity with multiple areas of fatty marrow versus hemangiomas. No suspicious osseous lesion appreciated.   Conus medullaris and cauda equina: Conus extends to the mid/lower L2 level. Conus and cauda equina appear normal.   Paraspinal and other soft tissues: Mild atrophy of the paraspinal musculature. Multiple parapelvic cysts in the left kidney are similar to prior. Mild prominence of the left renal pelvis. Scattered diverticuli along the partially visualized descending colon.   Disc levels:   T12-L1: Diffuse disc bulge and superimposed left subarticular disc protrusion which indents the ventral thecal sac. Bilateral facet arthrosis. Mild spinal canal stenosis. No significant foraminal stenosis.   L1-2: Diffuse disc bulge slightly eccentric to the right which indents the ventral thecal sac without significant spinal canal stenosis. Moderate bilateral facet arthrosis. Mild-to-moderate foraminal stenosis on the right secondary to prominent facet osteophytes.   L2-3: Mild disc height loss. Diffuse disc bulge and posterior osteophytic ridging which indents the ventral thecal sac resulting in narrowing of the lateral recesses. Disc bulge likely abuts the traversing left L3 nerve roots. Moderate facet arthrosis. Mild bilateral foraminal stenosis.   L3-4: Severe disc height loss. Disc bulge and posterior osteophytic ridging which indents the ventral thecal sac resulting in narrowing of the lateral recesses. Disc bulge and osteophytes possibly contacts the traversing bilateral L4 nerve roots. Moderate facet arthrosis. Mild spinal canal stenosis. Mild bilateral foraminal stenosis.   L4-5: Diffuse disc bulge and prominent central disc protrusion which indents the ventral thecal sac.  Moderate facet arthrosis. Mild spinal canal stenosis. There is mild narrowing of the left lateral recess without mass effect on the traversing nerve roots. Mild-to-moderate left foraminal stenosis.   L5-S1: Severe disc space narrowing with partial fusion of the vertebral bodies. No significant spinal canal or foraminal stenosis.   IMPRESSION: Degenerative changes of the lumbar spine as above. Disc bulge at L2-3 likely abuts the traversing left L3 nerve roots. Additional disc bulge and osteophytes at L3-4 likely abuts the traversing bilateral L4 nerve roots.   Multilevel foraminal stenosis, greatest and mild-to-moderate on the right at L1-2 and on the left at L4-5.   Degenerative endplate changes with associated discogenic edema at T11-12 and L3-4 which may contribute to back pain.   S shaped scoliosis of the thoracolumbar spine as above.     Electronically Signed   By: Donnice Mania M.D.   On: 05/30/2023 13:09     Objective:  VS:  HT:    WT:   BMI:     BP:106/72  HR:81bpm  TEMP: ( )  RESP:  Physical Exam Vitals and nursing note reviewed.  Constitutional:      General: She is not in acute distress.    Appearance: Normal appearance. She is not ill-appearing.  HENT:     Head: Normocephalic and atraumatic.     Right Ear: External ear normal.     Left Ear: External ear normal.  Eyes:     Extraocular Movements: Extraocular movements intact.  Cardiovascular:     Rate and Rhythm: Normal rate.     Pulses: Normal pulses.  Pulmonary:     Effort: Pulmonary effort is normal. No respiratory distress.  Abdominal:  General: There is no distension.     Palpations: Abdomen is soft.  Musculoskeletal:        General: Tenderness present.     Cervical back: Neck supple.     Right lower leg: No edema.     Left lower leg: No edema.     Comments: Patient has good distal strength with no pain over the greater trochanters.  No clonus or focal weakness.  Skin:    Findings: No  erythema, lesion or rash.  Neurological:     General: No focal deficit present.     Mental Status: She is alert and oriented to person, place, and time.     Sensory: No sensory deficit.     Motor: No weakness or abnormal muscle tone.     Coordination: Coordination normal.  Psychiatric:        Mood and Affect: Mood normal.        Behavior: Behavior normal.      Imaging: No results found.

## 2023-11-08 NOTE — Procedures (Signed)
 Lumbar Epidural Steroid Injection - Interlaminar Approach with Fluoroscopic Guidance  Patient: Susan Nunez      Date of Birth: Jan 02, 1941 MRN: [id number] PCP: Aisha Harvey, MD      Visit Date: 11/08/2023   Universal Protocol:     Consent Given By: the patient  Position: PRONE  Additional Comments: Vital signs were monitored before and after the procedure. Patient was prepped and draped in the usual sterile fashion. The correct patient, procedure, and site was verified.   Injection Procedure Details:   Procedure diagnoses: Lumbar radiculopathy [M54.16]   Meds Administered:  Meds ordered this encounter  Medications   methylPREDNISolone  acetate (DEPO-MEDROL ) injection 40 mg     Laterality: Left  Location/Site:  L4-5  Needle: 3.5 in., 20 ga. Tuohy  Needle Placement: Paramedian epidural  Findings:   -Comments: Excellent flow of contrast into the epidural space.  Procedure Details: Using a paramedian approach from the side mentioned above, the region overlying the inferior lamina was localized under fluoroscopic visualization and the soft tissues overlying this structure were infiltrated with 4 ml. of 1% Lidocaine  without Epinephrine . The Tuohy needle was inserted into the epidural space using a paramedian approach.   The epidural space was localized using loss of resistance along with counter oblique bi-planar fluoroscopic views.  After negative aspirate for air, blood, and CSF, a 2 ml. volume of Isovue-250 was injected into the epidural space and the flow of contrast was observed. Radiographs were obtained for documentation purposes.    The injectate was administered into the level noted above.   Additional Comments:  The patient tolerated the procedure well Dressing: 2 x 2 sterile gauze and Band-Aid    Post-procedure details: Patient was observed during the procedure. Post-procedure instructions were reviewed.  Patient left the clinic in stable condition.

## 2023-11-09 DIAGNOSIS — H6990 Unspecified Eustachian tube disorder, unspecified ear: Secondary | ICD-10-CM | POA: Diagnosis not present

## 2023-12-25 ENCOUNTER — Encounter: Payer: Self-pay | Admitting: Physical Medicine and Rehabilitation

## 2023-12-25 ENCOUNTER — Other Ambulatory Visit: Payer: Self-pay | Admitting: Physical Medicine and Rehabilitation

## 2023-12-25 DIAGNOSIS — M5416 Radiculopathy, lumbar region: Secondary | ICD-10-CM

## 2023-12-27 DIAGNOSIS — H353112 Nonexudative age-related macular degeneration, right eye, intermediate dry stage: Secondary | ICD-10-CM | POA: Diagnosis not present

## 2023-12-31 ENCOUNTER — Other Ambulatory Visit: Payer: Self-pay | Admitting: Internal Medicine

## 2023-12-31 MED ORDER — FLUTICASONE-SALMETEROL 230-21 MCG/ACT IN AERO: 2.0000 | INHALATION_SPRAY | Freq: Two times a day (BID) | RESPIRATORY_TRACT | 6 refills | Status: DC

## 2023-12-31 NOTE — Telephone Encounter (Signed)
 Copied from CRM #[id number]. Topic: Clinical - Medication Refill >> Dec 31, 2023  9:51 AM Celestine FALCON wrote: Medication: fluticasone -salmeterol (ADVAIR HFA) 230-21 MCG/ACT inhaler   Has the patient contacted their pharmacy? Yes; pt stated the pharmacy told her there was no updated prescription for this medication so they would not fill it. (Agent: If no, request that the patient contact the pharmacy for the refill. If patient does not wish to contact the pharmacy document the reason why and proceed with request.) (Agent: If yes, when and what did the pharmacy advise?)  This is the patient's preferred pharmacy:  [id number] - [place],  - [address] [place] [address] Phone: [phone] Fax: [phone]  Is this the correct pharmacy for this prescription? Yes If no, delete pharmacy and type the correct one.   Has the prescription been filled recently? Yes  Is the patient out of the medication? Yes  Has the patient been seen for an appointment in the last year OR does the patient have an upcoming appointment? Yes  Can we respond through MyChart? Yes  Agent: Please be advised that Rx refills may take up to 3 business days. We ask that you follow-up with your pharmacy.

## 2024-01-07 ENCOUNTER — Ambulatory Visit: Payer: Self-pay | Admitting: Internal Medicine

## 2024-01-07 NOTE — Telephone Encounter (Signed)
 FYI Only or Action Required?: Action required by provider: request for appointment and clinical question for provider.  Patient is followed in Pulmonology for Asthma, last seen on 08/28/2023 by Susan Susan RAMAN, MD.  Called Nurse Triage reporting Chest Pain.intermittent lasting a few seconds. Patient is thinking could be pleurisy  Symptoms began a week ago.  Interventions attempted: Rescue inhaler, Maintenance inhaler, and Increased fluids/rest.  Symptoms are: unchanged.  Triage Disposition: See Physician Within 24 Hours  Patient/caregiver understands and will follow disposition?: No, wishes to speak with PCP  E2C2 Pulmonary Triage - Initial Assessment Questions "Chief Complaint (e.g., cough, sob, wheezing, fever, chills, sweat or additional symptoms) *Go to specific symptom protocol after initial questions. Patient reports left upper chest pain that she describes as a pinching sensation. Patient states pain is intermittent and lasts only seconds. States pain level is 5 out of 10. States pain is similar to the last time she had pleurisy.  Patient reports no increases in shortness of breath from her baseline. No fever, chills or sweats. Discussed with patient being seen in the office. Patient is wanting to speak to provider before agreeing to see another provider in office. Asking for a call back.   "How long have symptoms been present?" Started about a week ago.  Have you tested for COVID or Flu? Note: If not, ask patient if a home test can be taken. If so, instruct patient to call back for positive results. No  MEDICINES:   "Have you used any OTC meds to help with symptoms?" Yes If yes, ask "What medications?" Tylenol   "Have you used your inhalers/maintenance medication?" Yes If yes, "What medications?" Albuterol  Advair  If inhaler, ask "How many puffs and how often?" Note: Review instructions on medication in the chart. Albuterol  2 puffs Q4H PRN Advair 2 puffs  BID  OXYGEN: "Do you wear supplemental oxygen?" No If yes, "How many liters are you supposed to use?"   "Do you monitor your oxygen levels?" No If yes, What is your reading (oxygen level) today?   What is your usual oxygen saturation reading?  (Note: Pulmonary O2 sats should be 90% or greater) 98%   Copied from CRM #[id number]. Topic: Clinical - Red Word Triage >> Jan 07, 2024 12:58 PM Lavanda D wrote: Red Word that prompted transfer to Nurse Triage: Pain: Sharp pinching pain in lung, intermittent pain but burning all the time.  Patient states no trouble breathing. She is using advair but is wondering if there could be a medication sent for the pain. She has had pleurisy in the past and thinks it might be that. Reason for Disposition  [1] Chest pain lasts < 5 minutes AND [2] NO chest pain or cardiac symptoms (e.g., breathing difficulty, sweating) now  (Exception: Chest pains that last only a few seconds.)  Answer Assessment - Initial Assessment Questions 1. LOCATION: Where does it hurt?       Left upper chest pain-patient states lung pain 2. RADIATION: Does the pain go anywhere else? (e.g., into neck, jaw, arms, back)     No radiation 3. ONSET: When did the chest pain begin? (Minutes, hours or days)      Started about a week ago 4. PATTERN: Does the pain come and go, or has it been constant since it started?  Does it get worse with exertion?      intermittent 5. DURATION: How long does it last (e.g., seconds, minutes, hours)     Lasts for a couple of seconds-describes it  as feeling like a pinch 6. SEVERITY: How bad is the pain?  (e.g., Scale 1-10; mild, moderate, or severe)     5 out of 10 7. CARDIAC RISK FACTORS: Do you have any history of heart problems or risk factors for heart disease? (e.g., angina, prior heart attack; diabetes, high blood pressure, high cholesterol, smoker, or strong family history of heart disease)     no 8. PULMONARY RISK FACTORS: Do you  have any history of lung disease?  (e.g., blood clots in lung, asthma, emphysema, birth control pills)     yes 9. CAUSE: What do you think is causing the chest pain?     Patient is thinking this might be pleurisy-pain feels very similar 10. OTHER SYMPTOMS: Do you have any other symptoms? (e.g., dizziness, nausea, vomiting, sweating, fever, difficulty breathing, cough)       cough  Protocols used: Chest Pain-A-AH

## 2024-01-08 NOTE — Telephone Encounter (Signed)
 I called and spoke with patient, advised that Dr. Meade would not be back in office until 11/4.  She was agreeable to scheduling an appointment with Candis Dandy NP at [facility].  She states she is better today after taking Ibuprofen.  She is scheduled for 11 am, advised to arrive by 10:45 am for check in.  She verbalized understanding.  Nothing further needed.

## 2024-01-09 ENCOUNTER — Ambulatory Visit (HOSPITAL_BASED_OUTPATIENT_CLINIC_OR_DEPARTMENT_OTHER)

## 2024-01-09 ENCOUNTER — Encounter (HOSPITAL_BASED_OUTPATIENT_CLINIC_OR_DEPARTMENT_OTHER): Payer: Self-pay

## 2024-01-09 VITALS — BP 147/86 | HR 73 | Ht 69.0 in | Wt 168.0 lb

## 2024-01-09 DIAGNOSIS — M940 Chondrocostal junction syndrome [Tietze]: Secondary | ICD-10-CM

## 2024-01-09 NOTE — Patient Instructions (Signed)
 Stop doing upper body exercises and rest for a few days.  Take Ibuprofen 400 mg every 6-8 hours as needed for the next 2-3 days; be mindful of an increase in reflux symptoms and stop use if those crop up.  Plan for routine follow up as needed; return to clinic sooner if new or worsening symptoms.

## 2024-01-09 NOTE — Progress Notes (Signed)
 @Patient  ID: Susan Nunez, female    DOB: 02-17-1941, 83 y.o.   MRN: [id number]  Chief Complaint  Patient presents with   Chest discomfort    Referring provider: Aisha Harvey, MD  HPI: Discussed the use of AI scribe software for clinical note transcription with the patient, who gave verbal consent to proceed.  History of Present Illness Susan Nunez is an 83 year old female who presents with a burning sensation and intermittent sharp pain in the left upper chest.  She has been experiencing a burning sensation in the left upper chest, initially thought to be due to reflux. The pain is described as sharp, similar to being 'stuck with a pen or a pinch,' occurring intermittently and lasting momentarily but recurring over an hour. This has persisted for a week and a half. She has been taking ibuprofen for the past two to three days; the burning sensation persists.  Ibuprofen is helpful to relieve the discomfort.  She has a history of asthma and uses Advair twice daily for maintenance, though not consistently every day, and albuterol  as needed. She used albuterol  this morning due to anticipated walking and weather conditions but not specifically for the chest issue. She experiences ongoing shortness of breath, exacerbated by environmental factors such as smoke and allergens, but no increase in shortness of breath related to the current chest symptoms.  She has a history of shingles, which previously affected her shoulder blade area, but the current symptoms do not resemble her past shingles experiences. No associated rash, nausea, appetite changes, fever, chills, or central chest pain.  She takes Prilosec for reflux and has a history of mitral valve prolapse, which was ruled out by a cardiologist a year or two ago. She is currently on Zyrtec  and Flonase  for allergies.  She has been dealing with sciatica for ten months, which has improved without surgery. She has been engaging in yoga chair  exercises to help with her sciatica which require upper body strength that she speculates may have contributed to her current symptoms.  Historical data: OV 6.17.2025:  Here for asthma follow up.  Asthma is doing well. Lately has had trouble with sciatica.    Had the flu and was managed as an outpatient.  Asthma did not flare when she had the flu but the coughing flared back pain.    Tried prednisone  for sciatica but none for respiratory issues.    Minimal albuterol  use.    Has some nasal drainage and having ear pain and pressure.    Current Regimen: advair with spacer, prn albuterol  Asthma Triggers: seasonal allergies, changes in temperature, smoke, strong smells, humidty.  Exacerbations in the last year: once requiring prednisone  History of hospitalization or intubation: never Allergy Testing: never had.  GERD: prilosec Allergic Rhinitis: claritin and zyrtec  as needed.   TEST/EVENTS : none new  Allergies  Allergen Reactions   Cholestyramine Itching   Citalopram Other (See Comments)    Severe diarrhea, dizziness and memory loss   Lorazepam Other (See Comments)   Ciprofloxacin Hcl Diarrhea   Demerol  Other (See Comments)    Bumps at site of injection   Flagyl [Metronidazole] Diarrhea and Nausea Only   Lovastatin Other (See Comments)    Muscle aches    Penicillins Itching and Rash    Has patient had a PCN reaction causing immediate rash, facial/tongue/throat swelling, SOB or lightheadedness with hypotension: No Has patient had a PCN reaction causing severe rash involving mucus membranes or skin necrosis:  No Has patient had a PCN reaction that required hospitalization: No Has patient had a PCN reaction occurring within the last 10 years: No If all of the above answers are NO, then may proceed with Cephalosporin use. 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   Chronic kidney disease    stage 3....renal insufficiency   Complication of anesthesia    difficulty waking up, difficulty peeing, extreme nausea & vomiting   Diverticulosis    GERD (gastroesophageal reflux disease)    not on daily basis   Heart murmur    Hyperlipidemia    Hypertension    Mitral valve prolapse    with mitral regurgitation    PONV (postoperative nausea and vomiting)     Tobacco History: Social History   Tobacco Use  Smoking Status Never  Smokeless Tobacco Never   Counseling given: Not Answered   Outpatient Medications Prior to Visit  Medication Sig Dispense Refill   acidophilus (RISAQUAD) CAPS capsule Take 1 capsule by mouth daily.     albuterol  (VENTOLIN  HFA) 108 (90 Base) MCG/ACT inhaler Inhale 2 puffs into the lungs every 4 (four) hours as needed for shortness of breath or wheezing. 1 each 5   aspirin  81 MG tablet Take 81 mg by mouth daily.       cetirizine  (ZYRTEC ) 10 MG tablet Take 1 tablet (10 mg total) by mouth daily. 30 tablet 5   Cholecalciferol (VITAMIN D3) 2000 UNITS TABS Take 2,000 Units by mouth daily.     fluticasone  (FLONASE ) 50 MCG/ACT nasal spray Place 1 spray into both nostrils daily. 16 g 5   fluticasone -salmeterol (ADVAIR HFA) 230-21 MCG/ACT inhaler Inhale 2 puffs into the lungs 2 (two) times daily. 12 each 6   latanoprost  (XALATAN ) 0.005 % ophthalmic solution Place 1 drop into both eyes at bedtime.     lisinopril  (PRINIVIL ,ZESTRIL ) 5 MG tablet Take 5 mg by mouth daily.       Multiple Vitamins-Minerals (EYE VITAMINS PO) Take 1 tablet by mouth daily.     Omega-3 Fatty Acids (FISH OIL) 1200 MG CAPS Take 1,200 mg by mouth 3 (three) times a week.     omeprazole  (PRILOSEC OTC)  20 MG tablet Take 20 mg by mouth daily as needed (for heartburn).     vitamin B-12 (CYANOCOBALAMIN ) 1000 MCG tablet Take 1,000 mcg by mouth daily.     risedronate (ACTONEL) 150 MG tablet Take 150 mg by mouth every 30 (thirty) days.      triazolam  (HALCION ) 0.25 MG tablet Take one tablet (0.25 mg) by mouth with food 30 minutes prior to procedure. 1 tablet 0   No facility-administered medications prior to visit.     Review of Systems: positive as per hpi  Constitutional:   No  weight loss, night sweats,  Fevers, chills, fatigue, or  lassitude.  HEENT:   No headaches,  Difficulty swallowing,  Tooth/dental problems, or  Sore throat,                No sneezing, itching, ear ache, nasal congestion, post nasal drip,  CV:  No chest pain,  Orthopnea, PND, swelling in lower extremities, anasarca, dizziness, palpitations, syncope.   GI  No heartburn, indigestion, abdominal pain, nausea, vomiting, diarrhea, change in bowel habits, loss of appetite, bloody stools.   Resp: No shortness of breath with exertion or at rest.  No excess mucus, no productive cough,  No non-productive cough,  No coughing up of blood.  No change in color of mucus.  No wheezing.  No chest wall deformity  Skin: no rash or lesions.  GU: no dysuria, change in color of urine, no urgency or frequency.  No flank pain, no hematuria   MS:  No joint pain or swelling.  No decreased range of motion.  No back pain.    Physical Exam  BP (!) 147/86   Pulse 73   Ht 5' 9 (1.753 m)   Wt 168 lb (76.2 kg)   SpO2 99%   BMI 24.81 kg/m   GEN: A/Ox3; pleasant , NAD, well nourished    HEENT:  [place]/AT,  EACs-clear, TMs-wnl, NOSE-clear, THROAT-clear, no lesions, no postnasal drip or exudate noted.   NECK:  Supple w/ fair ROM; no JVD; normal carotid impulses w/o bruits; no thyromegaly or nodules palpated; no lymphadenopathy.    RESP  Clear  P & A; w/o, wheezes/ rales/ or rhonchi. no accessory muscle use, no dullness to  percussion  CARD:  RRR, no m/r/g, no peripheral edema, pulses intact, no cyanosis or clubbing.   + Reproducible chest discomfort with palpation of the ribs and intercostal space of the left upper chest  GI:   Soft & nt; nml bowel sounds; no organomegaly or masses detected.   Musco: Warm bil, no deformities or joint swelling noted.   Neuro: alert, no focal deficits noted.    Skin: Warm, no lesions or rashes    Lab Results:  CBC    Component Value Date/Time   WBC 5.4 05/08/2022 2009   RBC 3.62 (L) 05/08/2022 2009   HGB 11.7 (L) 05/08/2022 2009   HCT 36.3 05/08/2022 2009   PLT 273 05/08/2022 2009   MCV 100.3 (H) 05/08/2022 2009   MCH 32.3 05/08/2022 2009   MCHC 32.2 05/08/2022 2009   RDW 14.4 05/08/2022 2009   LYMPHSABS 1.4 05/07/2017 1633   MONOABS 0.8 05/07/2017 1633   EOSABS 0.0 05/07/2017 1633   BASOSABS 0.0 05/07/2017 1633    BMET    Component Value Date/Time   NA 139 05/08/2022 2009   K 4.6 05/08/2022 2009   CL 107 05/08/2022 2009   CO2 22 05/08/2022 2009   GLUCOSE 108 (H) 05/08/2022 2009   BUN 35 (H) 05/08/2022 2009   CREATININE 1.10 (H) 05/08/2022 2009   CALCIUM 9.9 05/08/2022 2009   GFRNONAA 51 (L) 05/08/2022 2009   GFRAA 41 (L) 05/07/2017 1633    BNP No results found for: BNP  ProBNP No results found for: PROBNP  Imaging: No results found.  Administration History     None           No data to display          Lab Results  Component Value Date   NITRICOXIDE 32 10/03/2022     Assessment & Plan:   Assessment & Plan Costochondritis, acute  Assessment and Plan Assessment & Plan Costochondritis Intermittent sharp and burning pain in the left upper chest, reproducible upon palpation, suggests musculoskeletal origin. Ibuprofen provided relief, indicating inflammation. Recent upper body exercises may have contributed. - Ibuprofen twice daily, possibly three  times daily, for a few days to manage inflammation. - Advise rest and  avoid exacerbating activities. - Monitor for worsening or new symptoms such as fever or chills; reevaluate if these occur. - Monitor for issues with worsening reflux while taking Ibuprofen; stop use if these occur  Asthma Chronic asthma managed with Advair and albuterol . No increase in shortness of breath with current chest pain. Symptoms worsen in specific environments and times, such as allergy season or smoke exposure. -  No change in current therapy  Allergic rhinitis Managed with Zyrtec  and Flonase . Symptoms exacerbated by environmental allergens like ragweed and smoke.  Gastroesophageal reflux disease (GERD) GERD managed with Prilosec. No current exacerbation of symptoms noted with chest pain.  Follow-Up Discussed need for follow-up if symptoms worsen or new symptoms develop. - Reevaluate if symptoms worsen or new symptoms such as fever or chills develop.    Return for routine follow up as scheduled with Dr. Meade in December.  Candis Dandy, PA-C 01/09/2024

## 2024-01-12 ENCOUNTER — Ambulatory Visit (HOSPITAL_COMMUNITY)
Admission: RE | Admit: 2024-01-12 | Discharge: 2024-01-12 | Disposition: A | Discharge status: Home / Self Care | Source: Ambulatory Visit | Attending: Physical Medicine and Rehabilitation | Admitting: Physical Medicine and Rehabilitation

## 2024-01-12 DIAGNOSIS — M5127 Other intervertebral disc displacement, lumbosacral region: Secondary | ICD-10-CM | POA: Diagnosis not present

## 2024-01-12 DIAGNOSIS — M47816 Spondylosis without myelopathy or radiculopathy, lumbar region: Secondary | ICD-10-CM | POA: Diagnosis not present

## 2024-01-12 DIAGNOSIS — M4326 Fusion of spine, lumbar region: Secondary | ICD-10-CM | POA: Diagnosis not present

## 2024-01-12 DIAGNOSIS — M5416 Radiculopathy, lumbar region: Secondary | ICD-10-CM | POA: Diagnosis not present

## 2024-01-14 ENCOUNTER — Encounter: Payer: Self-pay | Admitting: Radiology

## 2024-01-15 ENCOUNTER — Ambulatory Visit: Payer: Self-pay

## 2024-01-15 ENCOUNTER — Telehealth: Payer: Self-pay

## 2024-01-15 NOTE — Telephone Encounter (Signed)
 Patient will call when  she get home to schedule MRI review, with Megan

## 2024-01-15 NOTE — Telephone Encounter (Signed)
 Spoke with patient and she advised she will just come in on 01/23/24 for office visit with Megan

## 2024-01-15 NOTE — Telephone Encounter (Signed)
-----   Message from [place], [place] E sent at 01/15/2024 10:38 AM EST ----- OV for lumbar MRI review.  ----- Message ----- From: Interface, Rad Results In Sent: 01/15/2024  10:08 AM EST To: Duwaine FORBES Pouch, NP

## 2024-01-22 ENCOUNTER — Encounter: Payer: Self-pay | Admitting: *Deleted

## 2024-01-23 ENCOUNTER — Ambulatory Visit: Attending: Student in an Organized Health Care Education/Training Program | Admitting: Internal Medicine

## 2024-01-23 ENCOUNTER — Ambulatory Visit: Admitting: Physical Medicine and Rehabilitation

## 2024-01-23 ENCOUNTER — Encounter: Payer: Self-pay | Admitting: Internal Medicine

## 2024-01-23 VITALS — BP 110/70 | HR 82 | Ht 69.0 in | Wt 162.0 lb

## 2024-01-23 DIAGNOSIS — I700 Atherosclerosis of aorta: Secondary | ICD-10-CM

## 2024-01-23 DIAGNOSIS — I10 Essential (primary) hypertension: Secondary | ICD-10-CM

## 2024-01-23 DIAGNOSIS — E782 Mixed hyperlipidemia: Secondary | ICD-10-CM | POA: Diagnosis not present

## 2024-01-23 NOTE — Progress Notes (Signed)
 Cardiology Office Note   Date:  01/23/2024  ID:  Susan Nunez, DOB 1941/02/21, MRN [id number] PCP: Aisha Harvey, MD  [facility] Providers Cardiologist:  Emeline FORBES Calender, MD     History of Present Illness Susan Nunez is a 83 y.o. female with past medical history of questionable remote mitral valve prolapse has not been documented by echocardiogram, atypical chest pain, hypertension, hyperlipidemia who presents today for annual follow-up.  She is doing well today without any complaints of chest pain, lower extremity swelling, exertional symptoms.  She does have shortness of breath that she attributes to her asthma due to the changing seasons.   ROS:  Review of Systems  All other systems reviewed and are negative.   Physical Exam  Physical Exam Vitals and nursing note reviewed.  Constitutional:      Appearance: Normal appearance.  HENT:     Head: Normocephalic and atraumatic.  Eyes:     Conjunctiva/sclera: Conjunctivae normal.  Neck:     Vascular: No carotid bruit.  Cardiovascular:     Rate and Rhythm: Normal rate and regular rhythm.  Pulmonary:     Effort: Pulmonary effort is normal.     Breath sounds: Normal breath sounds.  Musculoskeletal:        General: No swelling or tenderness.  Skin:    Coloration: Skin is not jaundiced or pale.  Neurological:     Mental Status: She is alert.     VS:  BP 110/70   Pulse 82   Ht 5' 9 (1.753 m)   Wt 162 lb (73.5 kg)   SpO2 97%   BMI 23.92 kg/m         Wt Readings from Last 3 Encounters:  01/23/24 162 lb (73.5 kg)  01/09/24 168 lb (76.2 kg)  08/28/23 163 lb 4.8 oz (74.1 kg)     EKG Interpretation Date/Time:  Wednesday Susan 12 2025 11:49:26 EST Ventricular Rate:  82 PR Interval:  186 QRS Duration:  76 QT Interval:  332 QTC Calculation: 387 R Axis:   -34  Text Interpretation: Normal sinus rhythm Left axis deviation When compared with ECG of 08-May-2022 18:42, No significant change was found  Confirmed by Calender Emeline [phone]) on 01/23/2024 11:53:11 AM    Studies Reviewed   Coronary CTA 05/19/2022: Coronary calcium score: 0  Echocardiogram 11/05/2018:  1. The left ventricle has normal systolic function with an ejection  fraction of 60-65%. The cavity size was normal. There is mild concentric  left ventricular hypertrophy. Left ventricular diastolic parameters were  normal.   2. The right ventricle has normal systolic function. The cavity was  normal. There is no increase in right ventricular wall thickness.   3. The aortic valve is tricuspid. Aortic valve regurgitation is trivial  by color flow Doppler.     Risk Assessment/Calculations             ASCVD risk score: The ASCVD Risk score (Arnett DK, et al., 2019) failed to calculate for the following reasons:   The 2019 ASCVD risk score is only valid for ages 23 to 23   ASSESSMENT  Remote history of mitral valve prolapse, likely misdiagnosis echo criteria for mitral valve prolapse has changed over the years and so this was likely diagnosed remotely by the old criteria.  Currently she does not have mitral valve prolapse and has not on several echocardiograms. Hypertension well-controlled Aortic atherosclerosis   Plan  No changes  Follow up: I offered to follow-up as  needed but patient would like to continue to follow-up on a yearly basis.  Will follow-up in 1 year          Signed, Emeline FORBES Calender, MD

## 2024-01-23 NOTE — Patient Instructions (Signed)
 Medication Instructions:  No medication changes were made at this visit. Continue current regimen.   Follow-Up: At [facility], you and your health needs are our priority.  As part of our continuing mission to provide you with exceptional heart care, our providers are all part of one team.  This team includes your primary Cardiologist (physician) and Advanced Practice Providers or APPs (Physician Assistants and Nurse Practitioners) who all work together to provide you with the care you need, when you need it.  Your next appointment:   1 year(s)  Provider:   Emeline FORBES Calender, MD

## 2024-01-26 DIAGNOSIS — H353112 Nonexudative age-related macular degeneration, right eye, intermediate dry stage: Secondary | ICD-10-CM | POA: Diagnosis not present

## 2024-01-30 ENCOUNTER — Ambulatory Visit: Admitting: Physical Medicine and Rehabilitation

## 2024-01-30 ENCOUNTER — Encounter: Payer: Self-pay | Admitting: Physical Medicine and Rehabilitation

## 2024-01-30 DIAGNOSIS — R269 Unspecified abnormalities of gait and mobility: Secondary | ICD-10-CM | POA: Diagnosis not present

## 2024-01-30 DIAGNOSIS — M5116 Intervertebral disc disorders with radiculopathy, lumbar region: Secondary | ICD-10-CM

## 2024-01-30 DIAGNOSIS — M961 Postlaminectomy syndrome, not elsewhere classified: Secondary | ICD-10-CM

## 2024-01-30 DIAGNOSIS — M5416 Radiculopathy, lumbar region: Secondary | ICD-10-CM

## 2024-01-30 MED ORDER — TRIAZOLAM 0.25 MG PO TABS: ORAL_TABLET | ORAL | 0 refills | Status: DC

## 2024-01-30 NOTE — Progress Notes (Signed)
 Pain Scale   Average Pain 4 Patient advised she has chronic lower back pain radiating to right side, pain is constant. Patient here for MRI review        +Driver, -BT, -Dye Allergies.

## 2024-01-30 NOTE — Progress Notes (Signed)
 Susan Nunez - 83 y.o. female MRN [id number]  Date of birth: July 03, 1940  Office Visit Note: Visit Date: 01/30/2024 PCP: Aisha Harvey, MD Referred by: Aisha Harvey, MD  Subjective: Chief Complaint  Patient presents with   Lower Back - Pain   HPI: Susan Nunez is a 83 y.o. female who comes in today for evaluation of chronic, worsening and severe right sided lower back pain radiating down right lateral leg to foot. Also reports numbness to 4th and 5th toes. Her daughter is accompanying her during our visit today. We have seen her in the past for more left sided symptoms, these right sided symptoms started several months ago. Her pain worsens with standing and walking. She describes pain as sharp sensation, currently rates as 5 out of 10. History of formal physical therapy with good relief of pain. 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 She underwent left L4-L5 interlaminar epidural steroid injection in our office on 11/08/2023. She reports greater than 80% relief of pain that continues to sustain. Patient currently using cane to assist with ambulation.         Review of Systems  Musculoskeletal:  Positive for back pain.  Neurological:  Negative for tingling, sensory change, focal weakness and weakness.  All other systems reviewed and are negative.  Otherwise per HPI.  Assessment & Plan: Visit Diagnoses:    ICD-10-CM   1. Lumbar radiculopathy  M54.16 Ambulatory referral to Physical Medicine Rehab    2. Radiculopathy due to lumbar intervertebral disc disorder  M51.16 Ambulatory referral to Physical Medicine Rehab    3. Post laminectomy syndrome  M96.1 Ambulatory referral to Physical Medicine Rehab    4. Gait abnormality  R26.9 Ambulatory referral to Physical Medicine  Rehab       Plan: Findings:  Chronic, worsening and severe right sided lower back pain radiating down right lateral leg to foot. Patient continues to have severe pain despite good conservative therapies such as formal physical therapy, home exercise regimen, rest and use of medications. I discussed recent lumbar MRI with patient/daughter using imaging and spine model. There is central disc herniation at the level of L4-L5 causing moderate left sided foraminal narrowing that is unchanged from prior imaging in March of this year. I explained to patient that this central herniation could cause bilateral pain. We discussed treatment plan in detail today. Next step is to perform diagnostic and hopefully therapeutic right L4-L5 interlaminar epidural steroid injection under fluoroscopic guidance. I also prescribed pre-procedure Halcion  for her to take on day of injection. She has no questions at this time. No red flag symptoms noted upon exam today.     Meds & Orders:  Meds ordered this encounter  Medications   triazolam  (HALCION ) 0.25 MG tablet    Sig: Take one tablet (0.25 mg) by mouth with food 30 minutes prior to procedure.    Dispense:  1 tablet    Refill:  0    Orders Placed This Encounter  Procedures   Ambulatory referral to Physical Medicine Rehab    Follow-up: Return for Right L4-L5 interlaminar epidural steroid injection.   Procedures: No procedures performed      Clinical History: Narrative & Impression CLINICAL DATA:  Low back pain with left leg pain   EXAM: MRI LUMBAR SPINE WITHOUT CONTRAST  TECHNIQUE: Multiplanar, multisequence MR imaging of the lumbar spine was performed. No intravenous contrast was administered.   COMPARISON:  May 19, 2023   FINDINGS: Bone marrow: No significant abnormality   Conus and cauda equina: No significant abnormality   Paraspinal tissues: No significant abnormality   L1-L2: There is a mild disc bulge. There is severe  facet arthropathy. No spinal stenosis or foraminal stenosis   L2-L3: There is severe degenerative disc disease with a mild disc bulge. There is moderate facet arthropathy. No spinal stenosis or significant foraminal stenosis   L3-L4: There is severe degenerative disc disease with slight retrolisthesis. There is moderate facet arthropathy. No spinal stenosis or significant foraminal stenosis   L4-L5: There is a small central disc herniation. There is moderate facet arthropathy. There is moderate stenosis of the left lateral recess which may be affecting the left L5 nerve root. There is ankylosis of the vertebral bodies and facet joints. No spinal stenosis or significant foraminal stenosis   L5-S1: Normal   IMPRESSION: 1. Severe lumbar spondylosis 2. There is ankylosis of the vertebral bodies and facet joints at L5-S1 3. There is a central disc herniation at L4-5 with facet arthropathy and moderate left neural foraminal stenosis which may be affecting the left L5 nerve root. This is unchanged from the prior study May 19, 2023     Electronically Signed   By: Nancyann Burns M.D.   On: 01/15/2024 10:06   She reports that she has never smoked. She has never used smokeless tobacco. No results for input(s): HGBA1C, LABURIC in the last 8760 hours.  Objective:  VS:  HT:    WT:   BMI:     BP:   HR: bpm  TEMP: ( )  RESP:  Physical Exam Vitals and nursing note reviewed.  HENT:     Head: Normocephalic and atraumatic.     Right Ear: External ear normal.     Left Ear: External ear normal.     Nose: Nose normal.     Mouth/Throat:     Mouth: Mucous membranes are moist.  Eyes:     Extraocular Movements: Extraocular movements intact.  Cardiovascular:     Rate and Rhythm: Normal rate.     Pulses: Normal pulses.  Pulmonary:     Effort: Pulmonary effort is normal.  Abdominal:     General: Abdomen is flat. There is no distension.  Musculoskeletal:        General: Tenderness  present.     Cervical back: Normal range of motion.     Comments: Patient is slow to rise from seated position to standing. Good lumbar range of motion. No pain noted with facet loading. 5/5 strength noted with bilateral hip flexion, knee flexion/extension, ankle dorsiflexion/plantarflexion and EHL. No clonus noted bilaterally. No pain upon palpation of greater trochanters. No pain with internal/external rotation of bilateral hips. Sensation intact bilaterally. Dysesthesias noted to right L5 dermatome. Negative slump test bilaterally. Ambulates without aid, gait steady.   Skin:    General: Skin is warm and dry.     Capillary Refill: Capillary refill takes less than 2 seconds.  Neurological:     Mental Status: She is alert and oriented to person, place, and time.     Gait: Gait abnormal.  Psychiatric:        Mood and Affect: Mood normal.        Behavior: Behavior normal.     Ortho Exam  Imaging: No results found.  Past Medical/Family/Surgical/Social History:  Medications & Allergies reviewed per EMR, new medications updated. 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   Chronic kidney disease    stage 3....renal insufficiency   Complication of anesthesia    difficulty waking up, difficulty peeing, extreme nausea & vomiting   Diverticulosis    GERD (gastroesophageal reflux disease)    not on daily basis   Heart murmur    Hyperlipidemia    Hypertension    Mitral valve prolapse    with mitral regurgitation    PONV (postoperative nausea and vomiting)    Family History  Problem Relation Age of Onset   Hypertension Mother     Coronary artery disease Father    Heart attack Father    Asthma Grandson    Allergic Disorder Grandson    Past Surgical History:  Procedure Laterality Date   ABDOMINAL HYSTERECTOMY     BACK SURGERY     x 3   CARDIAC CATHETERIZATION  02/21/2010   Est. EF at 65% -- mooth and normal coronary arteries -- Normal left ventricular systolic function.  We will continue with medical therapy.  I suspect her chest pain was due to her mitral valve prolapse syndrome --  Aleene DOROTHA Passe, M.D.    CHOLECYSTECTOMY N/A 01/15/2020   Procedure: LAPAROSCOPIC CHOLECYSTECTOMY WITH INTRAOPERATIVE CHOLANGIOGRAM;  Surgeon: Eletha Boas, MD;  Location: [facility];  Service: General;  Laterality: N/A;   FOOT SURGERY  07/12/00   right   JOINT REPLACEMENT     KNEE ARTHROSCOPY  06/14/2009   LUMBAR SPINE SURGERY     lumbar spine fusion L5-S1 performed 20 years ago x 3   TOTAL KNEE ARTHROPLASTY Right 11/04/2014   Procedure: RIGHT TOTAL KNEE ARTHROPLASTY;  Surgeon: Jerona Harden GAILS, MD;  Location: [facility];  Service: Orthopedics;  Laterality: Right;   TOTAL VAGINAL HYSTERECTOMY  12/18/2001   Social History   Occupational History   Not on file  Tobacco Use   Smoking status: Never   Smokeless tobacco: Never  Vaping Use   Vaping status: Never Used  Substance and Sexual Activity   Alcohol use: No   Drug use: No   Sexual activity: Not on file

## 2024-02-28 ENCOUNTER — Encounter: Payer: Self-pay | Admitting: Internal Medicine

## 2024-02-28 ENCOUNTER — Ambulatory Visit

## 2024-02-28 ENCOUNTER — Ambulatory Visit: Admitting: Internal Medicine

## 2024-02-28 VITALS — BP 118/74 | HR 98 | Temp 98.6°F | Ht 69.0 in | Wt 168.6 lb

## 2024-02-28 DIAGNOSIS — R079 Chest pain, unspecified: Secondary | ICD-10-CM

## 2024-02-28 DIAGNOSIS — K219 Gastro-esophageal reflux disease without esophagitis: Secondary | ICD-10-CM

## 2024-02-28 DIAGNOSIS — J4520 Mild intermittent asthma, uncomplicated: Secondary | ICD-10-CM | POA: Diagnosis not present

## 2024-02-28 DIAGNOSIS — J301 Allergic rhinitis due to pollen: Secondary | ICD-10-CM | POA: Diagnosis not present

## 2024-02-28 NOTE — Patient Instructions (Addendum)
 It was a pleasure to see you today!  Please schedule follow up with Dr. Kassie in 6 months.  Please call sooner [phone] if issues or concerns arise. You can also send us  a message through MyChart, but but aware that this is not to be used for urgent issues and it may take up to 5-7 days to receive a reply. Please be aware that you will likely be able to view your results before I have a chance to respond to them. Please give us  5 business days to respond to any non-urgent results.   continue claritin alternating with zyrtec  for allerties  Continue prilosec for reflux  Continue advair for your asthma.  Because you do not have daily symptoms, using this inhaler on an as needed basis may be sufficient.  I recommend starting to use the inhaler daily as prescribed if you have symptoms of asthma such as chest tightness, wheezing, coughing, shortness of breath. You should also start using it if you have exposure to a sick contact, worsening allergies, or any other trigger for your asthma. I recommend you keep using it even after your respiratory symptoms resolve for 3-4 days. The goal of this therapy is to prevent your symptoms from becoming a flare severe enough to require steroids like prednisone .   Please call our office if using this inhaler on an as needed basis is not sufficient. You might need to be seen sooner than our scheduled follow up.   Chest xray today to make sure chest pain is just related to muscle spasm, no fracture. Will call if abnormal.

## 2024-02-28 NOTE — Progress Notes (Signed)
 Susan Nunez    [id number]    1940-08-05  Primary Care Physician:McNeill, Sari, MD Date of Appointment: 02/28/2024 Established Patient Visit  Chief complaint:   Chief Complaint  Patient presents with   Asthma    Asthma follow up     HPI: Susan Nunez is a 83 y.o. woman with mild intermittent asthma and GERD with seasonal allergies.   Interval Updates: Here for asthma follow up.  Having trouble with sciatica, getting injections for this.   Adherent to advair with spacer but only takes as needed Does a good job of avoiding her triggers.  Doesn't use advair daily due to thrush and hoarse voice due to albuterol  Advair use is less than once a week - usually 1-2 times/month.   Reflux controlled with prilosec  Still alternating claritin/zyrtec  when needed.   Had episode of costchondritis, was relieved by nsaids. Has left upper chest pain worsened with movement, coughing, sleeping on that side. Has been going on for years.   Current Regimen: advair with spacer, prn albuterol  Asthma Triggers: seasonal allergies, changes in temperature, smoke, strong smells, humidty, perfumes Exacerbations in the last year: zero in the last year for breathing, has needed for her back.  History of hospitalization or intubation: never Allergy Testing: never had.  GERD: prilosec Allergic Rhinitis: claritin and zyrtec  as needed.  ACT:  Asthma Control Test ACT Total Score  02/28/2024 10:40 AM 23  08/28/2023  1:11 PM 21  10/03/2022  8:58 AM 21   FeNO: 41 ppb --->32 ppb  I have reviewed the patient's family social and past medical history and updated as appropriate.   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 EF at 65% -- mooth and normal coronary arteries -- Normal left ventricular systolic function.  We will continue with medical therapy.  I suspect her chest pain was due to her mitral valve prolapse syndrome --  Aleene DOROTHA Passe, M.D.    CHOLECYSTECTOMY N/A 01/15/2020   Procedure: LAPAROSCOPIC CHOLECYSTECTOMY WITH INTRAOPERATIVE CHOLANGIOGRAM;  Surgeon: Eletha Boas, MD;  Location: [facility];  Service: General;  Laterality: N/A;   FOOT SURGERY  07/12/00   right   JOINT REPLACEMENT     KNEE ARTHROSCOPY  06/14/2009   LUMBAR SPINE SURGERY     lumbar spine fusion L5-S1 performed 20 years ago x 3   TOTAL KNEE ARTHROPLASTY Right 11/04/2014   Procedure: RIGHT TOTAL KNEE ARTHROPLASTY;  Surgeon: Jerona Harden GAILS, MD;  Location: [facility];  Service: Orthopedics;  Laterality: Right;   TOTAL VAGINAL HYSTERECTOMY  12/18/2001    Family History  Problem Relation Age of Onset   Hypertension Mother  Coronary artery disease Father    Heart attack Father    Asthma Grandson    Allergic Disorder Grandson     Social History   Occupational History   Not on file  Tobacco Use   Smoking status: Never    Passive exposure: Past   Smokeless tobacco: Never  Vaping Use   Vaping status: Never Used  Substance and Sexual Activity   Alcohol use: No   Drug use: No   Sexual activity: Not on file     Physical Exam: Blood pressure 118/74, pulse 98, temperature 98.6 F (37 C), temperature source Oral, height 5' 9 (1.753 m), weight 168 lb 9.6 oz (76.5 kg), SpO2 100%.  Gen:      No distress Lungs:  ctab, no increased work of breathing, no  wheeze CV:        RRR MSK: no reproducible chest pain    Data Reviewed: Imaging: I have personally reviewed the chest xray April 2024 shows hyperinflation  PFTs:      No data to display          Labs: Lab Results  Component Value Date   NA 139 05/08/2022   K 4.6 05/08/2022   CO2 22 05/08/2022   GLUCOSE 108 (H) 05/08/2022   BUN 35 (H) 05/08/2022   CREATININE 1.10 (H) 05/08/2022   CALCIUM 9.9 05/08/2022   GFRNONAA 51 (L) 05/08/2022   Lab Results  Component Value Date   WBC 5.4 05/08/2022   HGB 11.7 (L) 05/08/2022   HCT 36.3 05/08/2022   MCV 100.3 (H) 05/08/2022   PLT 273 05/08/2022     Immunization status: Immunization History  Administered Date(s) Administered   Fluad Trivalent(High Dose 65+) 01/24/2024   Fluzone Influenza virus vaccine,trivalent (IIV3), split virus 01/28/2010, 01/12/2012, 01/26/2014, 01/14/2016, 12/14/2017, 11/02/2018, 12/29/2022   INFLUENZA, HIGH DOSE SEASONAL PF 12/14/2017, 11/02/2018   Moderna Covid-19 Vaccine Bivalent Booster 18yrs & up 01/27/2021   Moderna Sars-Covid-2 Vaccination 03/25/2019, 05/05/2019, 01/31/2020, 06/21/2020   PNEUMOCOCCAL CONJUGATE-20 08/24/2022   Pneumococcal Conjugate-13 04/10/2013   Pneumococcal Polysaccharide-23 07/17/2007   Respiratory Syncytial Virus Vaccine,Recomb Aduvanted(Arexvy) 10/15/2022   Td (Adult) 07/29/1999   Tdap 08/02/2010, 05/21/2020   Unspecified SARS-COV-2 Vaccination 01/24/2024   Zoster Recombinant(Shingrix) 11/16/2018   Zoster, Live 09/02/2008, 11/16/2018, 03/08/2019    External Records Personally Reviewed: pulmonary  Assessment:  Mild intermittent asthma, controlled Reflux -  controlled Chronic non seasonal allergic rhinitis, controlled  Plan/Recommendations: Continue intermittent inhaled steroids with advair with spacer.    I offered her a dose reduction in advair but she declined today.   continue claritin alternating with zyrtec  for allerties   Continue prilosec for  reflux   Chest xray for chest pain - suspect costochondritis but will rule out fracutre.    Return to Care: No follow-ups on file.   Verdon Gore, MD Pulmonary and Critical Care Medicine [facility] Office:[phone]

## 2024-03-03 ENCOUNTER — Other Ambulatory Visit: Payer: Self-pay

## 2024-03-03 ENCOUNTER — Ambulatory Visit: Admitting: Physical Medicine and Rehabilitation

## 2024-03-03 VITALS — BP 129/81 | HR 88

## 2024-03-03 DIAGNOSIS — M5416 Radiculopathy, lumbar region: Secondary | ICD-10-CM

## 2024-03-03 DIAGNOSIS — M961 Postlaminectomy syndrome, not elsewhere classified: Secondary | ICD-10-CM

## 2024-03-03 DIAGNOSIS — M48062 Spinal stenosis, lumbar region with neurogenic claudication: Secondary | ICD-10-CM

## 2024-03-03 DIAGNOSIS — F411 Generalized anxiety disorder: Secondary | ICD-10-CM

## 2024-03-03 MED ORDER — METHYLPREDNISOLONE ACETATE 40 MG/ML IJ SUSP
40.0000 mg | Freq: Once | INTRAMUSCULAR | Status: AC
  Administered 2024-03-03: 40 mg

## 2024-03-03 NOTE — Progress Notes (Signed)
 Pain Scale   Average Pain 5 Patient advising she has chronic lower back pain radiating to right leg patient advising her pain increases when standing and walking and when sitting pain decreases.        +Driver, -BT, -Dye Allergies.

## 2024-03-03 NOTE — Progress Notes (Signed)
 "  Susan Nunez - 83 y.o. female MRN [id number]  Date of birth: 02/06/1941  Office Visit Note: Visit Date: 03/03/2024 PCP: Aisha Harvey, MD Referred by: Aisha Harvey, MD  Subjective: Chief Complaint  Patient presents with   Lower Back - Pain   HPI:  Susan Nunez is a 83 y.o. female who comes in today at the request of Duwaine Pouch, FNP for planned Right L4-5 Lumbar Interlaminar epidural steroid injection with fluoroscopic guidance.  The patient has failed conservative care including home exercise, medications, time and activity modification.  This injection will be diagnostic and hopefully therapeutic.  Please see requesting physician notes for further details and justification. Her previous left sided pain is very much improved. Right side pain could be from stenosis vs trochanteric bursitis. Duwaine had new MRI that shows continued disc protrusion slightly regressed from prior.    ROS Otherwise per HPI.  Assessment & Plan: Visit Diagnoses:    ICD-10-CM   1. Lumbar radiculopathy  M54.16 XR C-ARM NO REPORT    Epidural Steroid injection    methylPREDNISolone  acetate (DEPO-MEDROL ) injection 40 mg    2. Spinal stenosis of lumbar region with neurogenic claudication  M48.062     3. Post laminectomy syndrome  M96.1     4. Pre-operative anxiety  F41.1       Plan: No additional findings.   Meds & Orders:  Meds ordered this encounter  Medications   methylPREDNISolone  acetate (DEPO-MEDROL ) injection 40 mg    Orders Placed This Encounter  Procedures   XR C-ARM NO REPORT   Epidural Steroid injection    Follow-up: Return for visit to requesting provider as needed.   Procedures: No procedures performed  Lumbar Epidural Steroid Injection - Interlaminar Approach with Fluoroscopic Guidance  Patient: Susan Nunez      Date of Birth: 1940-06-22 MRN: [id number] PCP: Aisha Harvey, MD      Visit Date: 03/03/2024   Universal Protocol:     Consent Given By: the  patient  Position: PRONE  Additional Comments: Vital signs were monitored before and after the procedure. Patient was prepped and draped in the usual sterile fashion. The correct patient, procedure, and site was verified.   Injection Procedure Details:   Procedure diagnoses: Lumbar radiculopathy [M54.16]   Meds Administered:  Meds ordered this encounter  Medications   methylPREDNISolone  acetate (DEPO-MEDROL ) injection 40 mg     Laterality: Right  Location/Site:  L4-5  Needle: 3.5 in., 20 ga. Tuohy  Needle Placement: Paramedian epidural  Findings:   -Comments: Excellent flow of contrast into the epidural space.  Procedure Details: Using a paramedian approach from the side mentioned above, the region overlying the inferior lamina was localized under fluoroscopic visualization and the soft tissues overlying this structure were infiltrated with 4 ml. of 1% Lidocaine  without Epinephrine . The Tuohy needle was inserted into the epidural space using a paramedian approach.   The epidural space was localized using loss of resistance along with counter oblique bi-planar fluoroscopic views.  After negative aspirate for air, blood, and CSF, a 2 ml. volume of Isovue-250 was injected into the epidural space and the flow of contrast was observed. Radiographs were obtained for documentation purposes.    The injectate was administered into the level noted above.   Additional Comments:  The patient tolerated the procedure well Dressing: 2 x 2 sterile gauze and Band-Aid    Post-procedure details: Patient was observed during the procedure. Post-procedure instructions were reviewed.  Patient left the clinic  in stable condition.   Clinical History: Narrative & Impression CLINICAL DATA:  Low back pain with left leg pain   EXAM: MRI LUMBAR SPINE WITHOUT CONTRAST   TECHNIQUE: Multiplanar, multisequence MR imaging of the lumbar spine was performed. No intravenous contrast was  administered.   COMPARISON:  May 19, 2023   FINDINGS: Bone marrow: No significant abnormality   Conus and cauda equina: No significant abnormality   Paraspinal tissues: No significant abnormality   L1-L2: There is a mild disc bulge. There is severe facet arthropathy. No spinal stenosis or foraminal stenosis   L2-L3: There is severe degenerative disc disease with a mild disc bulge. There is moderate facet arthropathy. No spinal stenosis or significant foraminal stenosis   L3-L4: There is severe degenerative disc disease with slight retrolisthesis. There is moderate facet arthropathy. No spinal stenosis or significant foraminal stenosis   L4-L5: There is a small central disc herniation. There is moderate facet arthropathy. There is moderate stenosis of the left lateral recess which may be affecting the left L5 nerve root. There is ankylosis of the vertebral bodies and facet joints. No spinal stenosis or significant foraminal stenosis   L5-S1: Normal   IMPRESSION: 1. Severe lumbar spondylosis 2. There is ankylosis of the vertebral bodies and facet joints at L5-S1 3. There is a central disc herniation at L4-5 with facet arthropathy and moderate left neural foraminal stenosis which may be affecting the left L5 nerve root. This is unchanged from the prior study May 19, 2023     Electronically Signed   By: Nancyann Burns M.D.   On: 01/15/2024 10:06     Objective:  VS:  HT:    WT:   BMI:     BP:129/81  HR:88bpm  TEMP: ( )  RESP:  Physical Exam Vitals and nursing note reviewed.  Constitutional:      General: She is not in acute distress.    Appearance: Normal appearance. She is not ill-appearing.  HENT:     Head: Normocephalic and atraumatic.     Right Ear: External ear normal.     Left Ear: External ear normal.  Eyes:     Extraocular Movements: Extraocular movements intact.  Cardiovascular:     Rate and Rhythm: Normal rate.     Pulses: Normal pulses.   Pulmonary:     Effort: Pulmonary effort is normal. No respiratory distress.  Abdominal:     General: There is no distension.     Palpations: Abdomen is soft.  Musculoskeletal:        General: Tenderness present.     Cervical back: Neck supple.     Right lower leg: No edema.     Left lower leg: No edema.     Comments: Patient has good distal strength with no pain over the greater trochanters.  No clonus or focal weakness.  Skin:    Findings: No erythema, lesion or rash.  Neurological:     General: No focal deficit present.     Mental Status: She is alert and oriented to person, place, and time.     Sensory: No sensory deficit.     Motor: No weakness or abnormal muscle tone.     Coordination: Coordination normal.  Psychiatric:        Mood and Affect: Mood normal.        Behavior: Behavior normal.      Imaging: No results found. "

## 2024-03-03 NOTE — Procedures (Signed)
 Lumbar Epidural Steroid Injection - Interlaminar Approach with Fluoroscopic Guidance  Patient: Susan Nunez      Date of Birth: Oct 02, 1940 MRN: [id number] PCP: Aisha Harvey, MD      Visit Date: 03/03/2024   Universal Protocol:     Consent Given By: the patient  Position: PRONE  Additional Comments: Vital signs were monitored before and after the procedure. Patient was prepped and draped in the usual sterile fashion. The correct patient, procedure, and site was verified.   Injection Procedure Details:   Procedure diagnoses: Lumbar radiculopathy [M54.16]   Meds Administered:  Meds ordered this encounter  Medications   methylPREDNISolone  acetate (DEPO-MEDROL ) injection 40 mg     Laterality: Right  Location/Site:  L4-5  Needle: 3.5 in., 20 ga. Tuohy  Needle Placement: Paramedian epidural  Findings:   -Comments: Excellent flow of contrast into the epidural space.  Procedure Details: Using a paramedian approach from the side mentioned above, the region overlying the inferior lamina was localized under fluoroscopic visualization and the soft tissues overlying this structure were infiltrated with 4 ml. of 1% Lidocaine  without Epinephrine . The Tuohy needle was inserted into the epidural space using a paramedian approach.   The epidural space was localized using loss of resistance along with counter oblique bi-planar fluoroscopic views.  After negative aspirate for air, blood, and CSF, a 2 ml. volume of Isovue-250 was injected into the epidural space and the flow of contrast was observed. Radiographs were obtained for documentation purposes.    The injectate was administered into the level noted above.   Additional Comments:  The patient tolerated the procedure well Dressing: 2 x 2 sterile gauze and Band-Aid    Post-procedure details: Patient was observed during the procedure. Post-procedure instructions were reviewed.  Patient left the clinic in stable condition.

## 2024-03-19 ENCOUNTER — Other Ambulatory Visit: Payer: Self-pay | Admitting: Internal Medicine

## 2024-03-19 NOTE — Telephone Encounter (Signed)
 Copied from CRM #[id number]. Topic: Clinical - Medication Refill >> Mar 19, 2024 10:27 AM Devaughn RAMAN wrote: Medication: fluticasone -salmeterol (ADVAIR HFA) 230-21 MCG/ACT inhaler  Has the patient contacted their pharmacy? No (Agent: If no, request that the patient contact the pharmacy for the refill. If patient does not wish to contact the pharmacy document the reason why and proceed with request.) (Agent: If yes, when and what did the pharmacy advise?)  This is the patient's preferred pharmacy:  [id number] - [place], [place] - [address] [place] [address] Phone: [phone] Fax: [phone]  Is this the correct pharmacy for this prescription? Yes If no, delete pharmacy and type the correct one.   Has the prescription been filled recently? No  Is the patient out of the medication? No  Has the patient been seen for an appointment in the last year OR does the patient have an upcoming appointment? Yes  Can we respond through MyChart? Yes  Agent: Please be advised that Rx refills may take up to 3 business days. We ask that you follow-up with your pharmacy.

## 2024-03-21 MED ORDER — FLUTICASONE-SALMETEROL 230-21 MCG/ACT IN AERO: 2.0000 | INHALATION_SPRAY | Freq: Two times a day (BID) | RESPIRATORY_TRACT | 6 refills | Status: AC

## 2024-04-03 ENCOUNTER — Telehealth (HOSPITAL_BASED_OUTPATIENT_CLINIC_OR_DEPARTMENT_OTHER): Payer: Self-pay

## 2024-04-04 ENCOUNTER — Other Ambulatory Visit (HOSPITAL_COMMUNITY): Payer: Self-pay

## 2024-04-04 ENCOUNTER — Telehealth: Payer: Self-pay | Admitting: Physical Medicine and Rehabilitation

## 2024-04-04 NOTE — Telephone Encounter (Signed)
 duplicate

## 2024-04-04 NOTE — Telephone Encounter (Signed)
 Pt called saying that they have received a jury summons and would like Dr. Eldonna to write he a letter excusing her from this. Call back number is [phone]. Jury summos is on March 9. She needs to have this done by March 2.

## 2024-04-08 ENCOUNTER — Encounter: Payer: Self-pay | Admitting: Physical Medicine and Rehabilitation

## 2024-04-09 ENCOUNTER — Telehealth: Payer: Self-pay | Admitting: Physical Medicine and Rehabilitation

## 2024-04-09 NOTE — Telephone Encounter (Signed)
 Pt wants to know if the letter to excuse her from jury duty was sent to her or the court because she has a form that needs to go with it. Call back number is [phone].

## 2024-07-23 ENCOUNTER — Encounter (HOSPITAL_BASED_OUTPATIENT_CLINIC_OR_DEPARTMENT_OTHER): Admitting: Pulmonary Disease
